# Patient Record
Sex: Male | Born: 1955 | Race: White | Hispanic: No | Smoking: Former smoker
Health system: Southern US, Community
[De-identification: ages and names within clinical notes are randomized; demographics above are authoritative.]

## PROBLEM LIST (undated history)

## (undated) DIAGNOSIS — K08109 Complete loss of teeth, unspecified cause, unspecified class: Secondary | ICD-10-CM

## (undated) DIAGNOSIS — I255 Ischemic cardiomyopathy: Secondary | ICD-10-CM

## (undated) DIAGNOSIS — K635 Polyp of colon: Secondary | ICD-10-CM

## (undated) DIAGNOSIS — K7689 Other specified diseases of liver: Secondary | ICD-10-CM

## (undated) DIAGNOSIS — I471 Supraventricular tachycardia, unspecified: Secondary | ICD-10-CM

## (undated) DIAGNOSIS — I2119 ST elevation (STEMI) myocardial infarction involving other coronary artery of inferior wall: Secondary | ICD-10-CM

## (undated) DIAGNOSIS — R001 Bradycardia, unspecified: Secondary | ICD-10-CM

## (undated) DIAGNOSIS — I10 Essential (primary) hypertension: Secondary | ICD-10-CM

## (undated) DIAGNOSIS — M549 Dorsalgia, unspecified: Secondary | ICD-10-CM

## (undated) DIAGNOSIS — N183 Chronic kidney disease, stage 3 unspecified: Secondary | ICD-10-CM

## (undated) DIAGNOSIS — E785 Hyperlipidemia, unspecified: Secondary | ICD-10-CM

## (undated) DIAGNOSIS — K810 Acute cholecystitis: Secondary | ICD-10-CM

## (undated) DIAGNOSIS — K219 Gastro-esophageal reflux disease without esophagitis: Secondary | ICD-10-CM

## (undated) DIAGNOSIS — Z7902 Long term (current) use of antithrombotics/antiplatelets: Secondary | ICD-10-CM

## (undated) DIAGNOSIS — J449 Chronic obstructive pulmonary disease, unspecified: Secondary | ICD-10-CM

## (undated) DIAGNOSIS — K420 Umbilical hernia with obstruction, without gangrene: Secondary | ICD-10-CM

## (undated) DIAGNOSIS — I700 Atherosclerosis of aorta: Secondary | ICD-10-CM

## (undated) DIAGNOSIS — I714 Abdominal aortic aneurysm, without rupture, unspecified: Secondary | ICD-10-CM

## (undated) DIAGNOSIS — N400 Enlarged prostate without lower urinary tract symptoms: Secondary | ICD-10-CM

## (undated) DIAGNOSIS — Z8679 Personal history of other diseases of the circulatory system: Secondary | ICD-10-CM

## (undated) DIAGNOSIS — I493 Ventricular premature depolarization: Secondary | ICD-10-CM

## (undated) DIAGNOSIS — Z7982 Long term (current) use of aspirin: Secondary | ICD-10-CM

## (undated) DIAGNOSIS — Z955 Presence of coronary angioplasty implant and graft: Secondary | ICD-10-CM

## (undated) DIAGNOSIS — I491 Atrial premature depolarization: Secondary | ICD-10-CM

## (undated) DIAGNOSIS — I5022 Chronic systolic (congestive) heart failure: Secondary | ICD-10-CM

## (undated) DIAGNOSIS — F17200 Nicotine dependence, unspecified, uncomplicated: Secondary | ICD-10-CM

## (undated) DIAGNOSIS — K5790 Diverticulosis of intestine, part unspecified, without perforation or abscess without bleeding: Secondary | ICD-10-CM

## (undated) DIAGNOSIS — I5020 Unspecified systolic (congestive) heart failure: Secondary | ICD-10-CM

## (undated) DIAGNOSIS — I480 Paroxysmal atrial fibrillation: Secondary | ICD-10-CM

## (undated) DIAGNOSIS — IMO0001 Reserved for inherently not codable concepts without codable children: Secondary | ICD-10-CM

## (undated) DIAGNOSIS — R002 Palpitations: Secondary | ICD-10-CM

## (undated) DIAGNOSIS — I2510 Atherosclerotic heart disease of native coronary artery without angina pectoris: Secondary | ICD-10-CM

## (undated) HISTORY — PX: INGUINAL HERNIA REPAIR: SUR1180

## (undated) HISTORY — DX: Atrial premature depolarization: I49.1

## (undated) HISTORY — DX: Ventricular premature depolarization: I49.3

## (undated) HISTORY — DX: Complete loss of teeth, unspecified cause, unspecified class: K08.109

## (undated) HISTORY — DX: Chronic systolic (congestive) heart failure: I50.22

## (undated) HISTORY — DX: Abdominal aortic aneurysm, without rupture, unspecified: I71.40

## (undated) HISTORY — DX: Presence of coronary angioplasty implant and graft: Z95.5

## (undated) HISTORY — PX: FRACTURE SURGERY: SHX138

## (undated) HISTORY — DX: Bradycardia, unspecified: R00.1

## (undated) HISTORY — DX: Supraventricular tachycardia, unspecified: I47.10

## (undated) HISTORY — DX: Atherosclerotic heart disease of native coronary artery without angina pectoris: I25.10

---

## 2010-11-24 ENCOUNTER — Ambulatory Visit: Payer: Self-pay | Admitting: General Surgery

## 2013-11-10 ENCOUNTER — Emergency Department: Payer: Self-pay | Admitting: Emergency Medicine

## 2017-12-12 ENCOUNTER — Emergency Department: Payer: Self-pay

## 2017-12-12 ENCOUNTER — Encounter: Payer: Self-pay | Admitting: Emergency Medicine

## 2017-12-12 ENCOUNTER — Other Ambulatory Visit: Payer: Self-pay

## 2017-12-12 ENCOUNTER — Emergency Department
Admission: EM | Admit: 2017-12-12 | Discharge: 2017-12-12 | Disposition: A | Discharge status: Home / Self Care | Payer: Self-pay | Attending: Emergency Medicine | Admitting: Emergency Medicine

## 2017-12-12 DIAGNOSIS — F17200 Nicotine dependence, unspecified, uncomplicated: Secondary | ICD-10-CM | POA: Insufficient documentation

## 2017-12-12 DIAGNOSIS — M47896 Other spondylosis, lumbar region: Secondary | ICD-10-CM

## 2017-12-12 DIAGNOSIS — I10 Essential (primary) hypertension: Secondary | ICD-10-CM | POA: Insufficient documentation

## 2017-12-12 DIAGNOSIS — M47816 Spondylosis without myelopathy or radiculopathy, lumbar region: Secondary | ICD-10-CM | POA: Insufficient documentation

## 2017-12-12 HISTORY — DX: Essential (primary) hypertension: I10

## 2017-12-12 HISTORY — DX: Dorsalgia, unspecified: M54.9

## 2017-12-12 MED ORDER — HYDROMORPHONE HCL 1 MG/ML IJ SOLN
1.0000 mg | Freq: Once | INTRAMUSCULAR | Status: AC
  Administered 2017-12-12: 1 mg via INTRAVENOUS
  Filled 2017-12-12: qty 1

## 2017-12-12 MED ORDER — MORPHINE SULFATE (PF) 4 MG/ML IV SOLN
4.0000 mg | Freq: Once | INTRAVENOUS | Status: AC
  Administered 2017-12-12: 4 mg via INTRAVENOUS
  Filled 2017-12-12: qty 1

## 2017-12-12 MED ORDER — KETOROLAC TROMETHAMINE 30 MG/ML IJ SOLN
30.0000 mg | Freq: Once | INTRAMUSCULAR | Status: AC
  Administered 2017-12-12: 30 mg via INTRAVENOUS
  Filled 2017-12-12: qty 1

## 2017-12-12 MED ORDER — ORPHENADRINE CITRATE 30 MG/ML IJ SOLN
60.0000 mg | Freq: Two times a day (BID) | INTRAMUSCULAR | Status: DC
  Administered 2017-12-12: 60 mg via INTRAVENOUS
  Filled 2017-12-12: qty 2

## 2017-12-12 MED ORDER — CYCLOBENZAPRINE HCL 10 MG PO TABS: 10.0000 mg | ORAL_TABLET | Freq: Three times a day (TID) | ORAL | 0 refills | Status: DC | PRN

## 2017-12-12 MED ORDER — MELOXICAM 15 MG PO TABS: 15.0000 mg | ORAL_TABLET | Freq: Every day | ORAL | 0 refills | Status: DC

## 2017-12-12 MED ORDER — OXYCODONE-ACETAMINOPHEN 7.5-325 MG PO TABS: 1.0000 | ORAL_TABLET | Freq: Four times a day (QID) | ORAL | 0 refills | Status: DC | PRN

## 2017-12-12 NOTE — ED Provider Notes (Signed)
[facility] Emergency Department Provider Note   ____________________________________________   First MD Initiated Contact with Patient 12/12/17 [id number]     (approximate)  I have reviewed the triage vital signs and the nursing notes.   HISTORY  Chief Complaint Back Pain    HPI Darryl Gilbert is a 62 y.o. male patient complain of right hip pain that radiates to his lower leg.  Patient the pain also radiates to his groin.  Patient the pain started 4 days ago.  Patient denies provocative incident for complaint.  Patient unable to bear weight this morning and arrived via EMS.  Past Medical History:  Diagnosis Date  . Back pain   . Hypertension     There are no active problems to display for this patient.   History reviewed. No pertinent surgical history.  Prior to Admission medications   Medication Sig Start Date End Date Taking? Authorizing Provider  losartan (COZAAR) 100 MG tablet Take 100 mg by mouth daily.   Yes [provider]  olmesartan (BENICAR) 20 MG tablet Take 20 mg by mouth daily.   Yes [provider]  cyclobenzaprine (FLEXERIL) 10 MG tablet Take 1 tablet (10 mg total) by mouth 3 (three) times daily as needed. 12/12/17   Sable Feil, PA-C  meloxicam (MOBIC) 15 MG tablet Take 1 tablet (15 mg total) by mouth daily. 12/12/17   Sable Feil, PA-C  oxyCODONE-acetaminophen (PERCOCET) 7.5-325 MG tablet Take 1 tablet by mouth every 6 (six) hours as needed. 12/12/17   Sable Feil, PA-C    Allergies Patient has no known allergies.  No family history on file.  Social History Social History   Tobacco Use  . Smoking status: Current Every Day Smoker  . Smokeless tobacco: Never Used  Substance Use Topics  . Alcohol use: Not on file  . Drug use: Not on file    Review of Systems Constitutional: No fever/chills Eyes: No visual changes. ENT: No sore throat. Cardiovascular: Denies chest pain. Respiratory: Denies  shortness of breath. Gastrointestinal: No abdominal pain.  No nausea, no vomiting.  No diarrhea.  No constipation. Genitourinary: Negative for dysuria. Musculoskeletal: Positive for back pain, left hip, and left leg pain Skin: Negative for rash. Neurological: Negative for headaches, focal weakness or numbness.   ____________________________________________   PHYSICAL EXAM:  VITAL SIGNS: ED Triage Vitals [12/12/17 0847]  Enc Vitals Group     BP (!) 145/67     Pulse Rate 71     Resp 20     Temp 98 F (36.7 C)     Temp Source Oral     SpO2 98 %     Weight      Height      Head Circumference      Peak Flow      Pain Score 8     Pain Loc      Pain Edu?      Excl. in [place]?    Constitutional: Alert and oriented.  Moderate stress. Gastrointestinal: Soft and nontender. No distention. No abdominal bruits. No CVA tenderness. Musculoskeletal: No obvious spinal deformity.  Moderate guarding palpation spinal processes 4 through S1.  Patient also has moderate guarding with palpation to greater trochanter area. Neurologic:  Normal speech and language. No gross focal neurologic deficits are appreciated. No gait instability. Skin:  Skin is warm, dry and intact. No rash noted.  Surgical scar consistent with operation and internal fixation left lower extremity. Psychiatric: Mood and  affect are normal. Speech and behavior are normal.  ____________________________________________   LABS (all labs ordered are listed, but only abnormal results are displayed)  Labs Reviewed - No data to display ____________________________________________  EKG   ____________________________________________  RADIOLOGY  ED MD interpretation:    Official radiology report(s): Dg Tibia/fibula Left  Result Date: 12/12/2017 CLINICAL DATA:  Presents via ems with back pain and acute pain that radiates into left hip, States pain started last Thursday and pain became worse this am Unable to bear any weight this  am EXAM: LEFT TIBIA AND FIBULA - 2 VIEW COMPARISON:  None. FINDINGS: There has been remote intramedullary nail fixation of the tibia. The tibial and fibular fracture sites are well-healed. No lucency about the hardware to indicate loosening/infection. No joint effusion at the knee. No soft tissue gas or cortical erosion to indicate infection. IMPRESSION: Postoperative changes.  No evidence for acute  abnormality. Electronically Signed   By: Nolon Nations M.D.   On: 12/12/2017 09:39   Ct Lumbar Spine Wo Contrast  Result Date: 12/12/2017 CLINICAL DATA:  Left lower quadrant pain starting on Thursday. Lumbar back pain. Unable to bear weight. EXAM: CT LUMBAR SPINE WITHOUT CONTRAST TECHNIQUE: Multidetector CT imaging of the lumbar spine was performed without intravenous contrast administration. Multiplanar CT image reconstructions were also generated. COMPARISON:  None. FINDINGS: Segmentation: 5 lumbar type vertebral bodies Alignment: Dextroscoliosis.  Mild L5-S1 retrolisthesis. Vertebrae: No acute fracture or focal pathologic process. 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 Paraspinal and other soft tissues: Atherosclerosis. 36 mm diameter infrarenal abdominal aortic aneurysm. Disc levels: T12- L1: Unremarkable. L1-L2: Disc narrowing with vacuum phenomenon and posterior ridging. No visible impingement L2-L3: Spondylosis and disc narrowing. Mild facet hypertrophy. No visible impingement L3-L4: Advanced disc narrowing with asymmetric left far-lateral endplate ridging. Asymmetric left facet hypertrophy. The foramina canal or patent. Question a left far-lateral herniation impinging on the left L3 nerve root, see 4:58. L4-L5: Advanced disc narrowing with endplate ridging. The posterior elements are hypertrophic. Subarticular recess narrowing that could affect either L5 nerve root. The foramina are patent L5-S1:Advanced degenerative disc narrowing asymmetric towards the right where there is  greater far-lateral spurring. Gas-filled structure in the lower right foramen compatible with synovial cyst-noncompressive. Patent canal IMPRESSION: 1. No acute finding. 2. Advanced degenerative disease with dextroscoliosis as described above. 3. Abdominal aortic aneurysm measuring 36 mm in diameter. Recommend followup by ultrasound in 2 years. This recommendation follows ACR consensus guidelines: White Paper of the ACR Incidental Findings Committee II on Vascular Findings. J Am Coll Radiol 2013; 10:789-794. Electronically Signed   By: Monte Fantasia M.D.   On: 12/12/2017 10:38   Dg Hip Unilat W Or Wo Pelvis 2-3 Views Left  Result Date: 12/12/2017 CLINICAL DATA:  Back pain radiating to LEFT hip EXAM: DG HIP (WITH OR WITHOUT PELVIS) 2-3V LEFT COMPARISON:  None FINDINGS: Hip and SI joint spaces preserved. Low normal osseous mineralization. No acute fracture, dislocation, or bone destruction. Degenerative disc disease changes at L4-L5 and L5-S1. IMPRESSION: No acute osseous abnormalities. Degenerative disc disease changes at visualized lower lumbar spine. Electronically Signed   By: Lavonia Dana M.D.   On: 12/12/2017 09:38    ____________________________________________   PROCEDURES  Procedure(s) performed:   Procedures  Critical Care performed: No  ____________________________________________   INITIAL IMPRESSION / ASSESSMENT AND PLAN / ED COURSE  As part of my medical decision making, I reviewed the following data within the [place]  Acute low back and hip pain secondary to degenerative changes.  Discussed x-ray and CT findings with patient and wife.  Patient given discharge care instruction advised follow-up PCP for continued care.  Patient given a work note and advised take medication as directed.  Patient advised medication may cause drowsiness.      ____________________________________________   FINAL CLINICAL IMPRESSION(S) / ED DIAGNOSES  Final  diagnoses:  Other osteoarthritis of spine, lumbar region     ED Discharge Orders         Ordered    oxyCODONE-acetaminophen (PERCOCET) 7.5-325 MG tablet  Every 6 hours PRN     12/12/17 1120    meloxicam (MOBIC) 15 MG tablet  Daily     12/12/17 1120    cyclobenzaprine (FLEXERIL) 10 MG tablet  3 times daily PRN     12/12/17 1120           Note:  This document was prepared using Dragon voice recognition software and may include unintentional dictation errors.    Sable Feil, PA-C 12/12/17 1128    Nance Pear, MD 12/12/17 1153

## 2017-12-12 NOTE — ED Triage Notes (Signed)
Presents via ems with back pain  States pain started last Thursday    States pain became worse this am  Unable to bear any wt this am

## 2017-12-12 NOTE — Discharge Instructions (Addendum)
Ambulate with crutches as needed.  Follow-up with family doctor for continued care.  Consider orthopedic consult.

## 2018-01-08 ENCOUNTER — Other Ambulatory Visit: Payer: Self-pay | Admitting: Physician Assistant

## 2019-03-06 ENCOUNTER — Other Ambulatory Visit: Payer: Self-pay

## 2019-03-06 DIAGNOSIS — Z20822 Contact with and (suspected) exposure to covid-19: Secondary | ICD-10-CM

## 2019-03-08 LAB — NOVEL CORONAVIRUS, NAA: SARS-CoV-2, NAA: NOT DETECTED

## 2020-02-25 ENCOUNTER — Ambulatory Visit: Payer: Self-pay | Attending: Internal Medicine

## 2020-02-25 DIAGNOSIS — Z23 Encounter for immunization: Secondary | ICD-10-CM

## 2020-02-25 NOTE — Progress Notes (Signed)
   Covid-19 Vaccination Clinic  Name:  Darryl Gilbert    MRN: [id number] DOB: 1955/10/09  02/25/2020  Darryl Gilbert was observed post Covid-19 immunization for 15 minutes without incident. He was provided with Vaccine Information Sheet and instruction to access the V-Safe system.   Darryl Gilbert was instructed to call 911 with any severe reactions post vaccine: Marland Kitchen Difficulty breathing  . Swelling of face and throat  . A fast heartbeat  . A bad rash all over body  . Dizziness and weakness

## 2020-12-20 ENCOUNTER — Encounter
Admission: EM | Disposition: A | Discharge status: Home / Self Care | Payer: Self-pay | Source: Home / Self Care | Attending: Internal Medicine

## 2020-12-20 ENCOUNTER — Inpatient Hospital Stay
Admission: EM | Admit: 2020-12-20 | Discharge: 2020-12-26 | DRG: 246 | Disposition: A | Discharge status: Home / Self Care | Payer: Self-pay | Attending: Internal Medicine | Admitting: Internal Medicine

## 2020-12-20 DIAGNOSIS — N289 Disorder of kidney and ureter, unspecified: Secondary | ICD-10-CM | POA: Diagnosis present

## 2020-12-20 DIAGNOSIS — F17210 Nicotine dependence, cigarettes, uncomplicated: Secondary | ICD-10-CM | POA: Diagnosis present

## 2020-12-20 DIAGNOSIS — I5021 Acute systolic (congestive) heart failure: Secondary | ICD-10-CM

## 2020-12-20 DIAGNOSIS — I4891 Unspecified atrial fibrillation: Secondary | ICD-10-CM

## 2020-12-20 DIAGNOSIS — Z0189 Encounter for other specified special examinations: Secondary | ICD-10-CM

## 2020-12-20 DIAGNOSIS — G8929 Other chronic pain: Secondary | ICD-10-CM | POA: Diagnosis present

## 2020-12-20 DIAGNOSIS — I2119 ST elevation (STEMI) myocardial infarction involving other coronary artery of inferior wall: Secondary | ICD-10-CM

## 2020-12-20 DIAGNOSIS — Z8679 Personal history of other diseases of the circulatory system: Secondary | ICD-10-CM

## 2020-12-20 DIAGNOSIS — I472 Ventricular tachycardia: Secondary | ICD-10-CM | POA: Diagnosis not present

## 2020-12-20 DIAGNOSIS — I4901 Ventricular fibrillation: Secondary | ICD-10-CM | POA: Diagnosis not present

## 2020-12-20 DIAGNOSIS — I2510 Atherosclerotic heart disease of native coronary artery without angina pectoris: Secondary | ICD-10-CM

## 2020-12-20 DIAGNOSIS — E876 Hypokalemia: Secondary | ICD-10-CM | POA: Diagnosis not present

## 2020-12-20 DIAGNOSIS — I2121 ST elevation (STEMI) myocardial infarction involving left circumflex coronary artery: Secondary | ICD-10-CM

## 2020-12-20 DIAGNOSIS — I110 Hypertensive heart disease with heart failure: Secondary | ICD-10-CM | POA: Diagnosis present

## 2020-12-20 DIAGNOSIS — Z20822 Contact with and (suspected) exposure to covid-19: Secondary | ICD-10-CM | POA: Diagnosis present

## 2020-12-20 DIAGNOSIS — I480 Paroxysmal atrial fibrillation: Secondary | ICD-10-CM | POA: Diagnosis present

## 2020-12-20 DIAGNOSIS — I213 ST elevation (STEMI) myocardial infarction of unspecified site: Secondary | ICD-10-CM | POA: Diagnosis present

## 2020-12-20 DIAGNOSIS — R0603 Acute respiratory distress: Secondary | ICD-10-CM

## 2020-12-20 DIAGNOSIS — F17200 Nicotine dependence, unspecified, uncomplicated: Secondary | ICD-10-CM

## 2020-12-20 DIAGNOSIS — I9581 Postprocedural hypotension: Secondary | ICD-10-CM | POA: Diagnosis not present

## 2020-12-20 DIAGNOSIS — I255 Ischemic cardiomyopathy: Secondary | ICD-10-CM | POA: Diagnosis present

## 2020-12-20 DIAGNOSIS — J9600 Acute respiratory failure, unspecified whether with hypoxia or hypercapnia: Secondary | ICD-10-CM

## 2020-12-20 DIAGNOSIS — I10 Essential (primary) hypertension: Secondary | ICD-10-CM

## 2020-12-20 DIAGNOSIS — Z720 Tobacco use: Secondary | ICD-10-CM

## 2020-12-20 DIAGNOSIS — R0789 Other chest pain: Secondary | ICD-10-CM

## 2020-12-20 DIAGNOSIS — Z791 Long term (current) use of non-steroidal anti-inflammatories (NSAID): Secondary | ICD-10-CM

## 2020-12-20 DIAGNOSIS — Z452 Encounter for adjustment and management of vascular access device: Secondary | ICD-10-CM

## 2020-12-20 DIAGNOSIS — E785 Hyperlipidemia, unspecified: Secondary | ICD-10-CM | POA: Diagnosis present

## 2020-12-20 DIAGNOSIS — I25118 Atherosclerotic heart disease of native coronary artery with other forms of angina pectoris: Secondary | ICD-10-CM | POA: Diagnosis present

## 2020-12-20 DIAGNOSIS — I97190 Other postprocedural cardiac functional disturbances following cardiac surgery: Secondary | ICD-10-CM | POA: Diagnosis not present

## 2020-12-20 DIAGNOSIS — J9601 Acute respiratory failure with hypoxia: Secondary | ICD-10-CM | POA: Diagnosis present

## 2020-12-20 DIAGNOSIS — Y838 Other surgical procedures as the cause of abnormal reaction of the patient, or of later complication, without mention of misadventure at the time of the procedure: Secondary | ICD-10-CM | POA: Diagnosis not present

## 2020-12-20 DIAGNOSIS — M549 Dorsalgia, unspecified: Secondary | ICD-10-CM | POA: Diagnosis present

## 2020-12-20 DIAGNOSIS — Z79899 Other long term (current) drug therapy: Secondary | ICD-10-CM

## 2020-12-20 DIAGNOSIS — R739 Hyperglycemia, unspecified: Secondary | ICD-10-CM | POA: Diagnosis not present

## 2020-12-20 DIAGNOSIS — I493 Ventricular premature depolarization: Secondary | ICD-10-CM | POA: Diagnosis not present

## 2020-12-20 DIAGNOSIS — D72829 Elevated white blood cell count, unspecified: Secondary | ICD-10-CM | POA: Diagnosis present

## 2020-12-20 DIAGNOSIS — I471 Supraventricular tachycardia: Secondary | ICD-10-CM | POA: Diagnosis present

## 2020-12-20 DIAGNOSIS — I2129 ST elevation (STEMI) myocardial infarction involving other sites: Principal | ICD-10-CM | POA: Diagnosis present

## 2020-12-20 HISTORY — PX: CORONARY/GRAFT ACUTE MI REVASCULARIZATION: CATH118305

## 2020-12-20 HISTORY — DX: ST elevation (STEMI) myocardial infarction involving other coronary artery of inferior wall: I21.19

## 2020-12-20 HISTORY — DX: Ischemic cardiomyopathy: I25.5

## 2020-12-20 HISTORY — PX: LEFT HEART CATH AND CORONARY ANGIOGRAPHY: CATH118249

## 2020-12-20 HISTORY — DX: Hyperlipidemia, unspecified: E78.5

## 2020-12-20 HISTORY — DX: Atherosclerotic heart disease of native coronary artery without angina pectoris: I25.10

## 2020-12-20 HISTORY — DX: Personal history of other diseases of the circulatory system: Z86.79

## 2020-12-20 LAB — COMPREHENSIVE METABOLIC PANEL
ALT: 22 U/L (ref 0–44)
AST: 30 U/L (ref 15–41)
Albumin: 4.1 g/dL (ref 3.5–5.0)
Alkaline Phosphatase: 49 U/L (ref 38–126)
Anion gap: 10 (ref 5–15)
BUN: 22 mg/dL (ref 8–23)
CO2: 23 mmol/L (ref 22–32)
Calcium: 9.6 mg/dL (ref 8.9–10.3)
Chloride: 102 mmol/L (ref 98–111)
Creatinine, Ser: 1.63 mg/dL — ABNORMAL HIGH (ref 0.61–1.24)
GFR, Estimated: 47 mL/min — ABNORMAL LOW (ref >60)
Glucose, Bld: 139 mg/dL — ABNORMAL HIGH (ref 70–99)
Potassium: 3.9 mmol/L (ref 3.5–5.1)
Sodium: 135 mmol/L (ref 135–145)
Total Bilirubin: 1 mg/dL (ref 0.3–1.2)
Total Protein: 7.5 g/dL (ref 6.5–8.1)

## 2020-12-20 LAB — PROTIME-INR
INR: 1 (ref 0.8–1.2)
Prothrombin Time: 13.1 seconds (ref 11.4–15.2)

## 2020-12-20 LAB — CBC WITH DIFFERENTIAL/PLATELET
Abs Immature Granulocytes: 0.05 10*3/uL (ref 0.00–0.07)
Basophils Absolute: 0.1 10*3/uL (ref 0.0–0.1)
Basophils Relative: 1 %
Eosinophils Absolute: 0.4 10*3/uL (ref 0.0–0.5)
Eosinophils Relative: 4 %
HCT: 42.9 % (ref 39.0–52.0)
Hemoglobin: 15.2 g/dL (ref 13.0–17.0)
Immature Granulocytes: 1 %
Lymphocytes Relative: 28 %
Lymphs Abs: 2.8 10*3/uL (ref 0.7–4.0)
MCH: 32.4 pg (ref 26.0–34.0)
MCHC: 35.4 g/dL (ref 30.0–36.0)
MCV: 91.5 fL (ref 80.0–100.0)
Monocytes Absolute: 0.5 10*3/uL (ref 0.1–1.0)
Monocytes Relative: 5 %
Neutro Abs: 6.1 10*3/uL (ref 1.7–7.7)
Neutrophils Relative %: 61 %
Platelets: 250 10*3/uL (ref 150–400)
RBC: 4.69 MIL/uL (ref 4.22–5.81)
RDW: 13.3 % (ref 11.5–15.5)
WBC: 9.9 10*3/uL (ref 4.0–10.5)
nRBC: 0 % (ref 0.0–0.2)

## 2020-12-20 LAB — APTT: aPTT: 31 seconds (ref 24–36)

## 2020-12-20 LAB — MRSA NEXT GEN BY PCR, NASAL: MRSA by PCR Next Gen: NOT DETECTED

## 2020-12-20 LAB — MAGNESIUM: Magnesium: 1.7 mg/dL (ref 1.7–2.4)

## 2020-12-20 LAB — GLUCOSE, CAPILLARY: Glucose-Capillary: 115 mg/dL — ABNORMAL HIGH (ref 70–99)

## 2020-12-20 LAB — TROPONIN I (HIGH SENSITIVITY)
Troponin I (High Sensitivity): 19 ng/L — ABNORMAL HIGH (ref <18)
Troponin I (High Sensitivity): 4521 ng/L (ref <18)
Troponin I (High Sensitivity): >24000 ng/L (ref <18)
Troponin I (High Sensitivity): >24000 ng/L (ref <18)

## 2020-12-20 LAB — RESP PANEL BY RT-PCR (FLU A&B, COVID) ARPGX2
Influenza A by PCR: NEGATIVE
Influenza B by PCR: NEGATIVE
SARS Coronavirus 2 by RT PCR: NEGATIVE

## 2020-12-20 LAB — POCT ACTIVATED CLOTTING TIME
Activated Clotting Time: 277 seconds
Activated Clotting Time: 277 seconds

## 2020-12-20 SURGERY — CORONARY/GRAFT ACUTE MI REVASCULARIZATION
Anesthesia: Moderate Sedation

## 2020-12-20 MED ORDER — TICAGRELOR 90 MG PO TABS
ORAL_TABLET | ORAL | Status: DC | PRN
  Administered 2020-12-20: 180 mg via ORAL

## 2020-12-20 MED ORDER — ONDANSETRON HCL 4 MG/2ML IJ SOLN: 4.0000 mg | Freq: Four times a day (QID) | INTRAMUSCULAR | Status: DC | PRN

## 2020-12-20 MED ORDER — AMIODARONE HCL IN DEXTROSE 360-4.14 MG/200ML-% IV SOLN
30.0000 mg/h | INTRAVENOUS | Status: DC
  Administered 2020-12-21 (×3): 30 mg/h via INTRAVENOUS
  Filled 2020-12-20 (×3): qty 200

## 2020-12-20 MED ORDER — SODIUM CHLORIDE 0.9% FLUSH
3.0000 mL | INTRAVENOUS | Status: DC | PRN
  Administered 2020-12-25: 3 mL via INTRAVENOUS

## 2020-12-20 MED ORDER — VERAPAMIL HCL 2.5 MG/ML IV SOLN
INTRAVENOUS | Status: DC | PRN
  Administered 2020-12-20: 2.5 mg via INTRAVENOUS

## 2020-12-20 MED ORDER — AMIODARONE LOAD VIA INFUSION
150.0000 mg | Freq: Once | INTRAVENOUS | Status: DC
  Filled 2020-12-20: qty 83.34

## 2020-12-20 MED ORDER — SODIUM CHLORIDE 0.9 % WEIGHT BASED INFUSION
1.0000 mL/kg/h | INTRAVENOUS | Status: AC
  Administered 2020-12-20: 1 mL/kg/h via INTRAVENOUS

## 2020-12-20 MED ORDER — MIDAZOLAM HCL 2 MG/2ML IJ SOLN
INTRAMUSCULAR | Status: AC
  Filled 2020-12-20: qty 2

## 2020-12-20 MED ORDER — FENTANYL CITRATE (PF) 100 MCG/2ML IJ SOLN
INTRAMUSCULAR | Status: DC | PRN
  Administered 2020-12-20: 50 ug via INTRAVENOUS

## 2020-12-20 MED ORDER — FENTANYL CITRATE PF 50 MCG/ML IJ SOSY
PREFILLED_SYRINGE | INTRAMUSCULAR | Status: AC
  Administered 2020-12-20: 50 ug via INTRAVENOUS
  Filled 2020-12-20: qty 1

## 2020-12-20 MED ORDER — TICAGRELOR 90 MG PO TABS
ORAL_TABLET | ORAL | Status: AC
  Filled 2020-12-20: qty 2

## 2020-12-20 MED ORDER — NOREPINEPHRINE BITARTRATE 1 MG/ML IV SOLN
INTRAVENOUS | Status: AC
  Filled 2020-12-20: qty 4

## 2020-12-20 MED ORDER — VERAPAMIL HCL 2.5 MG/ML IV SOLN
INTRAVENOUS | Status: AC
  Filled 2020-12-20: qty 2

## 2020-12-20 MED ORDER — ASPIRIN 81 MG PO CHEW
81.0000 mg | CHEWABLE_TABLET | Freq: Every day | ORAL | Status: DC
  Administered 2020-12-20 – 2020-12-26 (×7): 81 mg via ORAL
  Filled 2020-12-20 (×7): qty 1

## 2020-12-20 MED ORDER — ATORVASTATIN CALCIUM 20 MG PO TABS
80.0000 mg | ORAL_TABLET | Freq: Every day | ORAL | Status: DC
  Administered 2020-12-20: 80 mg via ORAL

## 2020-12-20 MED ORDER — CHLORHEXIDINE GLUCONATE CLOTH 2 % EX PADS
6.0000 | MEDICATED_PAD | Freq: Every day | CUTANEOUS | Status: DC
  Administered 2020-12-22 – 2020-12-26 (×3): 6 via TOPICAL

## 2020-12-20 MED ORDER — ACETAMINOPHEN 325 MG PO TABS
650.0000 mg | ORAL_TABLET | ORAL | Status: DC | PRN
  Administered 2020-12-20 – 2020-12-23 (×2): 650 mg via ORAL
  Filled 2020-12-20: qty 2

## 2020-12-20 MED ORDER — METOPROLOL TARTRATE 5 MG/5ML IV SOLN
INTRAVENOUS | Status: AC
  Administered 2020-12-20: 5 mg via INTRAVENOUS
  Filled 2020-12-20: qty 5

## 2020-12-20 MED ORDER — CYCLOBENZAPRINE HCL 10 MG PO TABS
10.0000 mg | ORAL_TABLET | Freq: Three times a day (TID) | ORAL | Status: DC | PRN
  Administered 2020-12-20 – 2020-12-21 (×3): 10 mg via ORAL
  Filled 2020-12-20 (×7): qty 1

## 2020-12-20 MED ORDER — HEPARIN SODIUM (PORCINE) 1000 UNIT/ML IJ SOLN
INTRAMUSCULAR | Status: DC | PRN
  Administered 2020-12-20: 5000 [IU] via INTRAVENOUS
  Administered 2020-12-20: 2000 [IU] via INTRAVENOUS

## 2020-12-20 MED ORDER — AMIODARONE LOAD VIA INFUSION
INTRAVENOUS | Status: DC | PRN
  Administered 2020-12-20: 150 mg via INTRAVENOUS

## 2020-12-20 MED ORDER — LIDOCAINE HCL (PF) 1 % IJ SOLN
INTRAMUSCULAR | Status: DC | PRN
  Administered 2020-12-20: 3 mL

## 2020-12-20 MED ORDER — HEPARIN (PORCINE) IN NACL 1000-0.9 UT/500ML-% IV SOLN
INTRAVENOUS | Status: AC
  Filled 2020-12-20: qty 1000

## 2020-12-20 MED ORDER — CARVEDILOL 3.125 MG PO TABS
3.1250 mg | ORAL_TABLET | Freq: Two times a day (BID) | ORAL | Status: DC
  Administered 2020-12-20 – 2020-12-21 (×3): 3.125 mg via ORAL
  Filled 2020-12-20 (×3): qty 1

## 2020-12-20 MED ORDER — AMIODARONE HCL IN DEXTROSE 360-4.14 MG/200ML-% IV SOLN
60.0000 mg/h | INTRAVENOUS | Status: DC
  Administered 2020-12-20 (×2): 60 mg/h via INTRAVENOUS
  Filled 2020-12-20: qty 200

## 2020-12-20 MED ORDER — SODIUM CHLORIDE 0.9 % IV BOLUS
1000.0000 mL | Freq: Once | INTRAVENOUS | Status: AC
  Administered 2020-12-20: 1000 mL via INTRAVENOUS

## 2020-12-20 MED ORDER — LIDOCAINE HCL 1 % IJ SOLN
INTRAMUSCULAR | Status: AC
  Filled 2020-12-20: qty 20

## 2020-12-20 MED ORDER — TICAGRELOR 90 MG PO TABS
90.0000 mg | ORAL_TABLET | Freq: Two times a day (BID) | ORAL | Status: DC
  Administered 2020-12-20 – 2020-12-22 (×5): 90 mg via ORAL
  Filled 2020-12-20 (×5): qty 1

## 2020-12-20 MED ORDER — SODIUM CHLORIDE 0.9 % IV SOLN
250.0000 mL | INTRAVENOUS | Status: DC | PRN
  Administered 2020-12-21: 1000 mL via INTRAVENOUS

## 2020-12-20 MED ORDER — FENTANYL CITRATE PF 50 MCG/ML IJ SOSY
PREFILLED_SYRINGE | INTRAMUSCULAR | Status: AC
  Filled 2020-12-20: qty 1

## 2020-12-20 MED ORDER — ENOXAPARIN SODIUM 40 MG/0.4ML IJ SOSY
40.0000 mg | PREFILLED_SYRINGE | INTRAMUSCULAR | Status: DC
  Administered 2020-12-21 – 2020-12-26 (×6): 40 mg via SUBCUTANEOUS
  Filled 2020-12-20 (×6): qty 0.4

## 2020-12-20 MED ORDER — SODIUM CHLORIDE 0.9% FLUSH
3.0000 mL | Freq: Two times a day (BID) | INTRAVENOUS | Status: DC
  Administered 2020-12-21 – 2020-12-26 (×9): 3 mL via INTRAVENOUS

## 2020-12-20 MED ORDER — HEPARIN (PORCINE) IN NACL 2000-0.9 UNIT/L-% IV SOLN
INTRAVENOUS | Status: DC | PRN
  Administered 2020-12-20: 1000 mL

## 2020-12-20 MED ORDER — HEPARIN SODIUM (PORCINE) 5000 UNIT/ML IJ SOLN
4000.0000 [IU] | Freq: Once | INTRAMUSCULAR | Status: AC
  Administered 2020-12-20: 4000 [IU] via INTRAVENOUS

## 2020-12-20 MED ORDER — AMIODARONE HCL IN DEXTROSE 360-4.14 MG/200ML-% IV SOLN
INTRAVENOUS | Status: AC
  Filled 2020-12-20: qty 200

## 2020-12-20 MED ORDER — AMIODARONE HCL 150 MG/3ML IV SOLN
INTRAVENOUS | Status: DC | PRN
Start: 2020-12-20 — End: 2020-12-20

## 2020-12-20 MED ORDER — IOHEXOL 350 MG/ML SOLN
INTRAVENOUS | Status: DC | PRN
  Administered 2020-12-20: 155 mL

## 2020-12-20 MED ORDER — MIDAZOLAM HCL 2 MG/2ML IJ SOLN
INTRAMUSCULAR | Status: DC | PRN
  Administered 2020-12-20: 2 mg via INTRAVENOUS

## 2020-12-20 MED ORDER — HEPARIN SODIUM (PORCINE) 1000 UNIT/ML IJ SOLN
INTRAMUSCULAR | Status: AC
  Filled 2020-12-20: qty 1

## 2020-12-20 MED ORDER — SODIUM CHLORIDE 0.9 % IV SOLN
INTRAVENOUS | Status: AC | PRN
  Administered 2020-12-20: 250 mL/h via INTRAVENOUS

## 2020-12-20 MED ORDER — HEPARIN (PORCINE) 25000 UT/250ML-% IV SOLN: 1250.0000 [IU]/h | INTRAVENOUS | Status: DC

## 2020-12-20 MED ORDER — SODIUM CHLORIDE 0.9 % IV SOLN
INTRAVENOUS | Status: DC | PRN
  Administered 2020-12-20: 5 ug/min via INTRAVENOUS

## 2020-12-20 SURGICAL SUPPLY — 19 items
BALLN TREK RX 2.5X12 (BALLOONS) ×2
BALLOON TREK RX 2.5X12 (BALLOONS) ×1 IMPLANT
CATH INFINITI 5FR ANG PIGTAIL (CATHETERS) ×2 IMPLANT
CATH LAUNCHER 6FR JR4 (CATHETERS) ×2 IMPLANT
CATH VISTA GUIDE 6FR AL1 (CATHETERS) ×2 IMPLANT
CATH VISTA GUIDE 6FR XBLAD3.5 (CATHETERS) ×2 IMPLANT
DEVICE RAD TR BAND REGULAR (VASCULAR PRODUCTS) ×2 IMPLANT
DRAPE BRACHIAL (DRAPES) ×2 IMPLANT
GLIDESHEATH SLEND SS 6F .021 (SHEATH) ×2 IMPLANT
GUIDEWIRE INQWIRE 1.5J.035X260 (WIRE) ×1 IMPLANT
INQWIRE 1.5J .035X260CM (WIRE) ×2
KIT ENCORE 26 ADVANTAGE (KITS) ×2 IMPLANT
PACK CARDIAC CATH (CUSTOM PROCEDURE TRAY) ×2 IMPLANT
PROTECTION STATION PRESSURIZED (MISCELLANEOUS) ×2
SET ATX SIMPLICITY (MISCELLANEOUS) ×2 IMPLANT
STATION PROTECTION PRESSURIZED (MISCELLANEOUS) ×1 IMPLANT
STENT ONYX FRONTIER 3.0X22 (Permanent Stent) ×2 IMPLANT
TUBING CIL FLEX 10 FLL-RA (TUBING) ×2 IMPLANT
WIRE RUNTHROUGH .014X180CM (WIRE) ×2 IMPLANT

## 2020-12-20 NOTE — H&P (Signed)
[facility] History and Physical  Darryl Gilbert [id number] DOB: May 14, 1955 DOA: 12/20/2020  Referring physician: Dr. [name] PCP: Patient, No Pcp Per (Inactive)   Chief Complaint: chest pain  HPI: Darryl Gilbert is a 65 y.o. male with a reported history only of hypertension and chronic back pain, who presented via EMS with chest pain.  Per ED provider documentation he presented to the ED via field STEMI alert.  On arrival he reported 10 out of 10 chest pain, had labile blood pressures and heart rate.  An EKG that was sent from the field as well as 1 on arrival showed an inferior and lateral STEMI.  Cardiology was consulted and took him immediately to the Cath Lab.  On my signout from Dr. [name] patient required significant sedation during the procedure as he was having intense pain.  He subsequently had a stent placed in his circumflex artery which was occluded with improvement in his pain.  Per Dr. Cicero Duck once that happened he experienced significant hypotension secondary to the large amounts of sedation he required, and was briefly on Levophed for pressure support but is currently weaning off as he is now becoming hypertensive again.  On my interview with the patient he reports that he was working on his shed earlier today when he suddenly had intense central chest pain that radiated to both arms.  He denied feeling nauseous but did endorse having cold sweats.  He denies any family history of heart disease.  He currently smokes half pack a day, which is a decrease from previously smoking 3 packs a day.   Review of Systems:  Pertinent positives and negative per HPI, all others reviewed and negative  Past Medical History:  Diagnosis Date   Back pain    Hypertension    History reviewed. No pertinent surgical history. Social History:  reports that he has been smoking. He has never used smokeless tobacco. No history on file for alcohol use and drug use.  No Known Allergies  History  reviewed. No pertinent family history.   Prior to Admission medications   Medication Sig Start Date End Date Taking? Authorizing Provider  cyclobenzaprine (FLEXERIL) 10 MG tablet Take 1 tablet (10 mg total) by mouth 3 (three) times daily as needed. 12/12/17   Sable Feil, PA-C  losartan (COZAAR) 100 MG tablet Take 100 mg by mouth daily.    [provider]  meloxicam (MOBIC) 15 MG tablet Take 1 tablet (15 mg total) by mouth daily. 12/12/17   Sable Feil, PA-C  olmesartan (BENICAR) 20 MG tablet Take 20 mg by mouth daily.    [provider]  oxyCODONE-acetaminophen (PERCOCET) 7.5-325 MG tablet Take 1 tablet by mouth every 6 (six) hours as needed. 12/12/17   Sable Feil, PA-C   Physical Exam: Vitals:   12/20/20 1528 12/20/20 1641 12/20/20 1645 12/20/20 1700  BP:  (!) 83/68 115/82 104/76  Pulse:  77  61  Resp:  '18 19 15  '$ SpO2:  91%  100%  Weight:  93.6 kg    Height: '6\' 1"'$  (1.854 m)       Wt Readings from Last 3 Encounters:  12/20/20 93.6 kg     General:  Appears calm and mildly uncomfortable Eyes: PERRL, normal lids, irises & conjunctiva ENT: grossly normal hearing, lips & tongue Neck: no masses Cardiovascular: RRR, no m/r/g. No LE edema.Warm, dry, and well perfused extremities. Telemetry: SR, rarely with short runs of Vtach or inferolateral ST elevations that quickly  resolve Respiratory: CTA bilaterally, no w/r/r. Normal respiratory effort. Abdomen: soft, ntnd Skin: no rash or induration seen on limited exam Musculoskeletal: grossly normal tone BUE/BLE Psychiatric: grossly normal mood and affect, speech fluent and appropriate Neurologic: grossly non-focal.          Labs on Admission:  Basic Metabolic Panel: Recent Labs  Lab 12/20/20 1448  NA 135  K 3.9  CL 102  CO2 23  GLUCOSE 139*  BUN 22  CREATININE 1.63*  CALCIUM 9.6  MG 1.7   Liver Function Tests: Recent Labs  Lab 12/20/20 1448  AST 30  ALT 22  ALKPHOS 49  BILITOT 1.0  PROT  7.5  ALBUMIN 4.1   No results for input(s): LIPASE, AMYLASE in the last 168 hours. No results for input(s): AMMONIA in the last 168 hours. CBC: Recent Labs  Lab 12/20/20 1448  WBC 9.9  NEUTROABS 6.1  HGB 15.2  HCT 42.9  MCV 91.5  PLT 250   Cardiac Enzymes: No results for input(s): CKTOTAL, CKMB, CKMBINDEX, TROPONINI in the last 168 hours.  BNP (last 3 results) No results for input(s): BNP in the last 8760 hours.  ProBNP (last 3 results) No results for input(s): PROBNP in the last 8760 hours.  CBG: Recent Labs  Lab 12/20/20 1642  GLUCAP 115*    Radiological Exams on Admission: CARDIAC CATHETERIZATION  Result Date: 12/20/2020   Ost RCA to Prox RCA lesion is 70% stenosed.   Prox Cx to Mid Cx lesion is 100% stenosed.   1st Mrg lesion is 60% stenosed.   A drug-eluting stent was successfully placed using a STENT ONYX FRONTIER 3.0X22.   Post intervention, there is a 0% residual stenosis.   There is moderate left ventricular systolic dysfunction.   LV end diastolic pressure is moderately elevated.   The left ventricular ejection fraction is 35-45% by visual estimate. 1.  Codominant coronary arteries with thrombotic occlusion of the proximal left circumflex which is the culprit for inferior STEMI.  There is also 70% stenosis in ostial right coronary artery which has high anterior takeoff.  Catheter induced spasm in the ostial right coronary artery cannot be completely excluded. 2.  Moderately reduced LV systolic function with segmental wall motion abnormalities in the left circumflex distribution.  Moderately elevated left ventricular end-diastolic pressure at 22 mmHg. 3.  Successful angioplasty and drug-eluting stent placement to the left circumflex. Recommendations: Dual antiplatelet therapy for at least 12 months. Aggressive treatment of risk factors and smoking cessation. The patient had atrial fibrillation on presentation but converted sinus rhythm at the end of the case with amiodarone  drip.  Continue amiodarone drip for now and monitor rhythm closely.    EKG: Independently reviewed.  Sinus tachycardia with acute ST elevations in leads II, III, aVF, V5, and V6, as well as reciprocal depressions in leads I, aVL, V1, and V2.  Assessment/Plan Active Problems:   STEMI (ST elevation myocardial infarction) (HCC)   Acute ST elevation myocardial infarction (STEMI) of inferior wall (HCC)   Darryl Gilbert is a 65 y.o. male with a reported history only of hypertension and chronic back pain, who presented via EMS with chest pain and was found to have an inferior lateral STEMI status post DES placement to the proximal left circumflex artery.  #STEMI #Transient Afib #HTN Patient with multiple risk factors including hypertension and smoking, status post DES placement in the Cath Lab with near resolution of his symptoms.  EF during case 35 to 40%.  Also briefly  had RVR that resolved with amiodarone at the end of the case. - DC heparin per cardiology recs - Continue aspirin and ticagrelor, minimum 12 months - Start carvedilol per cardiology recs - Resume ACE or ARB once blood pressure can tolerate - Maintain on amiodarone drip for time being - Atorvastatin 80 mg daily - Echocardiogram pending - Follow-up cardiology recommendations  #Tobacco use disorder Patient declined nicotine replacement therapy.   Code Status: Full Code DVT Prophylaxis: Lovenox Family Communication: None per patient request Disposition Plan: Inpatient, stepdown unit  Time spent: 50 min  Clarnce Flock MD/MP[facility]  Note:  This document was prepared using Systems analyst and may include unintentional dictation errors.

## 2020-12-20 NOTE — ED Provider Notes (Signed)
[facility] Emergency Department Provider Note ____________________________________________   None    (approximate)  I have reviewed the triage vital signs and the nursing notes.  HISTORY  Chief Complaint Chest Pain   HPI Darryl Gilbert is a 65 y.o. malewho presents to the ED for evaluation of chest pain.  Chart review indicates history of hypertension and smoking.  Patient presents to the ED via field STEMI alert.  He reports developing chest pain earlier today after working in his shed out back.  He has never felt this pain before.  Reports 10/10 global chest pressure, radiating bilaterally to his arms.  Reports associated nausea and diaphoresis without emesis or syncope.  He has never seen a cardiologist, no heart catheterizations, heart attacks or stents.  EMS reports finding him with low blood pressure, systolics in the 0000000, responding well to IV fluids, receiving 500 cc prehospital.  324 aspirin was provided, but no nitroglycerin.  100 mcg of fentanyl were provided prehospital.  Heart rate was in the 60s, but he significant tachycardic just prior to arrival to the hospital with rates in the 130s-140s.  Past Medical History:  Diagnosis Date   Back pain    Hypertension     There are no problems to display for this patient.   No past surgical history on file.  Prior to Admission medications   Medication Sig Start Date End Date Taking? Authorizing Provider  cyclobenzaprine (FLEXERIL) 10 MG tablet Take 1 tablet (10 mg total) by mouth 3 (three) times daily as needed. 12/12/17   Sable Feil, PA-C  losartan (COZAAR) 100 MG tablet Take 100 mg by mouth daily.    [provider]  meloxicam (MOBIC) 15 MG tablet Take 1 tablet (15 mg total) by mouth daily. 12/12/17   Sable Feil, PA-C  olmesartan (BENICAR) 20 MG tablet Take 20 mg by mouth daily.    [provider]  oxyCODONE-acetaminophen (PERCOCET) 7.5-325 MG tablet Take 1 tablet by  mouth every 6 (six) hours as needed. 12/12/17   Sable Feil, PA-C    Allergies Patient has no known allergies.  No family history on file.  Social History Social History   Tobacco Use   Smoking status: Every Day   Smokeless tobacco: Never    Review of Systems  Constitutional: No fever/chills Eyes: No visual changes. ENT: No sore throat. Cardiovascular: Positive for chest pain. Respiratory: Denies shortness of breath. Gastrointestinal: No abdominal pain.   No diarrhea.  No constipation. Genitourinary: Negative for dysuria. Musculoskeletal: Negative for back pain. Skin: Negative for rash. Neurological: Negative for headaches, focal weakness or numbness.  ____________________________________________   PHYSICAL EXAM:  VITAL SIGNS: Vitals:   12/20/20 1455 12/20/20 1456  BP: (!) 146/116   Pulse: 76 84  Resp: (!) 23 17  SpO2: 100% 100%     Constitutional: Alert and oriented. Well appearing and in no acute distress. Eyes: Conjunctivae are normal. PERRL. EOMI. Head: Atraumatic. Nose: No congestion/rhinnorhea. Mouth/Throat: Mucous membranes are moist.  Oropharynx non-erythematous. Neck: No stridor. No cervical spine tenderness to palpation. Cardiovascular: Normal rate, regular rhythm. Grossly normal heart sounds.  Good peripheral circulation. Respiratory: Normal respiratory effort.  No retractions. Lungs CTAB. Gastrointestinal: Soft , nondistended, nontender to palpation. No CVA tenderness. Musculoskeletal: No lower extremity tenderness nor edema.  No joint effusions. No signs of acute trauma. Neurologic:  Normal speech and language. No gross focal neurologic deficits are appreciated. No gait instability noted. Skin:  Skin is warm, dry and  intact. No rash noted. Psychiatric: Mood and affect are normal. Speech and behavior are normal.  ____________________________________________   LABS (all labs ordered are listed, but only abnormal results are displayed)  Labs  Reviewed  RESP PANEL BY RT-PCR (FLU A&B, COVID) ARPGX2  CBC WITH DIFFERENTIAL/PLATELET  COMPREHENSIVE METABOLIC PANEL  MAGNESIUM  PROTIME-INR  APTT  TROPONIN I (HIGH SENSITIVITY)   ____________________________________________  12 Lead EKG  Twelve-lead EKG reviewed by me demonstrates inferior STEMI.  Sinus rhythm with rates in the 130s on our twelve-lead, rate in the 60s with prehospital EKG.  ____________________________________________  [place]  ED MD interpretation:   Official radiology report(s): No results found.  ____________________________________________   PROCEDURES and INTERVENTIONS  Procedure(s) performed (including Critical Care):  .1-3 Lead EKG Interpretation  Date/Time: 12/20/2020 3:14 PM Performed by: Vladimir Crofts, MD Authorized by: Vladimir Crofts, MD     Interpretation: abnormal     ECG rate:  134   ECG rate assessment: tachycardic     Rhythm: sinus rhythm     Ectopy: PAC     Conduction: normal   .Critical Care  Date/Time: 12/20/2020 3:15 PM Performed by: Vladimir Crofts, MD Authorized by: Vladimir Crofts, MD   Critical care provider statement:    Critical care time (minutes):  30   Critical care was necessary to treat or prevent imminent or life-threatening deterioration of the following conditions:  Cardiac failure   Critical care was time spent personally by me on the following activities:  Discussions with consultants, evaluation of patient's response to treatment, examination of patient, ordering and performing treatments and interventions, ordering and review of laboratory studies, ordering and review of radiographic studies, pulse oximetry, re-evaluation of patient's condition, obtaining history from patient or surrogate and review of old charts  Medications  midazolam (VERSED) injection (2 mg Intravenous Given 12/20/20 1507)  lidocaine (PF) (XYLOCAINE) 1 % injection (3 mLs Infiltration Given 12/20/20 1508)  Heparin (Porcine) in NaCl 2000-0.9 UNIT/L-%  SOLN (1,000 mLs  Given 12/20/20 1508)  verapamil (ISOPTIN) injection (2.5 mg Intravenous Given 12/20/20 1511)  heparin injection 4,000 Units (has no administration in time range)  sodium chloride 0.9 % bolus 1,000 mL (has no administration in time range)  fentaNYL (SUBLIMAZE) 50 MCG/ML injection (50 mcg Intravenous Given 12/20/20 1453)  metoprolol tartrate (LOPRESSOR) 5 MG/5ML injection (5 mg Intravenous Given 12/20/20 1454)    ____________________________________________   MDM / ED COURSE   65 year old male presents to the ED with field inferior STEMI requiring emergent left heart catheterization.  EMS transmitted EKG with inferior STEMI.  He progresses to rapid rates just prior to ED arrival, requiring IV metoprolol.  Cardiology sees the patient in the ED and recommends heparinization and left heart cath emergently.  Remains hemodynamically stable with IV fluids.      ____________________________________________   FINAL CLINICAL IMPRESSION(S) / ED DIAGNOSES  Final diagnoses:  ST elevation myocardial infarction (STEMI) involving other coronary artery of inferior wall ([place])  Other chest pain     ED Discharge Orders     None        Chenelle Benning   Note:  This document was prepared using Dragon voice recognition software and may include unintentional dictation errors.    Vladimir Crofts, MD 12/20/20 [id number]

## 2020-12-20 NOTE — Progress Notes (Signed)
  Chaplain On-Call was unable to respond immediately to the Code STEMI notification due to a call from Labor and Delivery Observation for urgent support after a Fetal Loss.  Chaplain located this patient in [place] after his procedure in the Cath Lab.  Patient is not available for a visit at this time.  Chaplain will remain available for support as needed.  Chaplain Pollyann Samples M.Div., [facility]

## 2020-12-20 NOTE — Progress Notes (Addendum)
  Amiodarone Drug - Drug Interaction Consult Note  Recommendations: Carvedilol - vitals to be monitored. Atorvastatin 80 mg. Pt to report any muscle pain/weakness  Amiodarone is metabolized by the cytochrome P450 system and therefore has the potential to cause many drug interactions. Amiodarone has an average plasma half-life of 50 days (range 20 to 100 days).   There is potential for drug interactions to occur several weeks or months after stopping treatment and the onset of drug interactions may be slow after initiating amiodarone.   '[x]'$  Statins: Increased risk of myopathy. Simvastatin- restrict dose to '20mg'$  daily. Other statins: counsel patients to report any muscle pain or weakness immediately.  '[]'$  Anticoagulants: Amiodarone can increase anticoagulant effect. Consider warfarin dose reduction. Patients should be monitored closely and the dose of anticoagulant altered accordingly, remembering that amiodarone levels take several weeks to stabilize.  '[]'$  Antiepileptics: Amiodarone can increase plasma concentration of phenytoin, the dose should be reduced. Note that small changes in phenytoin dose can result in large changes in levels. Monitor patient and counsel on signs of toxicity.  '[x]'$  Beta blockers: increased risk of bradycardia, AV block and myocardial depression. Sotalol - avoid concomitant use.  '[]'$   Calcium channel blockers (diltiazem and verapamil): increased risk of bradycardia, AV block and myocardial depression.  '[]'$   Cyclosporine: Amiodarone increases levels of cyclosporine. Reduced dose of cyclosporine is recommended.  '[]'$  Digoxin dose should be halved when amiodarone is started.  '[]'$  Diuretics: increased risk of cardiotoxicity if hypokalemia occurs.  '[]'$  Oral hypoglycemic agents (glyburide, glipizide, glimepiride): increased risk of hypoglycemia. Patient's glucose levels should be monitored closely when initiating amiodarone therapy.   '[]'$  Drugs that prolong the QT interval:   Torsades de pointes risk may be increased with concurrent use - avoid if possible.  Monitor QTc, also keep magnesium/potassium WNL if concurrent therapy can't be avoided.  Antibiotics: e.g. fluoroquinolones, erythromycin.  Antiarrhythmics: e.g. quinidine, procainamide, disopyramide, sotalol.  Antipsychotics: e.g. phenothiazines, haloperidol.   Lithium, tricyclic antidepressants, and methadone. Thank You,  Forde Dandy Darryl Gilbert  12/20/2020 3:54 PM

## 2020-12-20 NOTE — ED Triage Notes (Signed)
Pt arrived via EMS with chest pain 30 prior to EMS arrival. Pt is having chest pain at this time.Pt called a STEMI in the field. Pt arrived in SVT from EMS.

## 2020-12-20 NOTE — Progress Notes (Signed)
Pt arrived to ICU room 2 from cath lab;   Amiodarone gtt infusing at 33.3 ml/hr;  Pt alert and oriented;  2 L [place];  Sinus Bradycardia/Sinus Rhythm noted;  ST elevation and ectopy noted; MD at bedside;  12 lead EKG completed;  PIVs intact;   Tylenol given for shoulder pain  Rectal temp obtained due to unable to obtain temp orally, or axillary;  TR band with 13 cc of air; Release in progress  KRAY, RN

## 2020-12-20 NOTE — Consult Note (Signed)
Cardiology Consultation:   Patient ID: Darryl Gilbert MRN: [id number]; DOB: 05/09/1955  Admit date: 12/20/2020 Date of Consult: 12/20/2020  PCP:  Patient, No Pcp Per (Inactive)   [facility] Providers Cardiologist:  None    Darryl [name])     Patient Profile:   Darryl Gilbert is a 65 y.o. male with a hx of essential hypertension and tobacco use who is being seen 12/20/2020 for the evaluation of inferior ST elevation myocardial infarction at the request of Dr. Tamala Julian.  History of Present Illness:   Darryl Gilbert is a 65 year old male with no prior cardiac history.  He has known history of essential hypertension, tobacco use and chronic back pain.  He presented with acute onset of substernal chest pain radiating to his neck and left arm.  The pain was described as heavy discomfort and was rated as 10 out of 10.  It was associated with dizziness and shortness of breath.  His fiance called EMS and the patient was found to have significant anterior ST elevation on his EKG and thus a code STEMI was activated.  The patient was in severe pain that required 50 mcg of fentanyl by EMS and was having significant pain on arrival to the ED with hypertension and tachycardia.  He was noted to be in A. fib with RVR with persistent inferior ST elevation.  He was given 5 mg of IV metoprolol and 4000 units of unfractioned heparin.  I recommended proceeding with emergent cardiac catheterization and possible PCI.   Past Medical History:  Diagnosis Date   Back pain    Hypertension     History reviewed. No pertinent surgical history.   Home Medications:  Prior to Admission medications   Medication Sig Start Date End Date Taking? Authorizing Provider  cyclobenzaprine (FLEXERIL) 10 MG tablet Take 1 tablet (10 mg total) by mouth 3 (three) times daily as needed. 12/12/17   Sable Feil, PA-C  losartan (COZAAR) 100 MG tablet Take 100 mg by mouth daily.    [provider]  meloxicam (MOBIC) 15 MG tablet Take 1  tablet (15 mg total) by mouth daily. 12/12/17   Sable Feil, PA-C  olmesartan (BENICAR) 20 MG tablet Take 20 mg by mouth daily.    [provider]  oxyCODONE-acetaminophen (PERCOCET) 7.5-325 MG tablet Take 1 tablet by mouth every 6 (six) hours as needed. 12/12/17   Sable Feil, PA-C    Inpatient Medications: Scheduled Meds:  amiodarone  150 mg Intravenous Once   aspirin  81 mg Oral Daily   atorvastatin  80 mg Oral Daily   carvedilol  3.125 mg Oral BID WC   [START ON 12/21/2020] Chlorhexidine Gluconate Cloth  6 each Topical Q0600   [START ON 12/21/2020] enoxaparin (LOVENOX) injection  40 mg Subcutaneous Q24H   [START ON 12/21/2020] sodium chloride flush  3 mL Intravenous Q12H   ticagrelor  90 mg Oral BID   Continuous Infusions:  [START ON 12/21/2020] sodium chloride     sodium chloride     amiodarone 60 mg/hr (12/20/20 1555)   Followed by   amiodarone     PRN Meds: [START ON 12/21/2020] sodium chloride, acetaminophen, cyclobenzaprine, ondansetron (ZOFRAN) IV, [START ON 12/21/2020] sodium chloride flush  Allergies:   No Known Allergies  Social History:   Social History   Socioeconomic History   Marital status: Divorced    Spouse name: Not on file   Number of children: Not on file   Years of education:  Not on file   Highest education level: Not on file  Occupational History   Not on file  Tobacco Use   Smoking status: Every Day   Smokeless tobacco: Never  Substance and Sexual Activity   Alcohol use: Not on file   Drug use: Not on file   Sexual activity: Not on file  Other Topics Concern   Not on file  Social History Narrative   Not on file   Social Determinants of Health   Financial Resource Strain: Not on file  Food Insecurity: Not on file  Transportation Needs: Not on file  Physical Activity: Not on file  Stress: Not on file  Social Connections: Not on file  Intimate Partner Violence: Not on file    Family History:   History reviewed. No pertinent  family history.   ROS:  Please see the history of present illness.   All other ROS reviewed and negative.     Physical Exam/Data:   Vitals:   12/20/20 1455 12/20/20 1456 12/20/20 1528 12/20/20 1641  BP: (!) 146/116   (!) 83/68  Pulse: 76 84  77  Resp: (!) '23 17  18  '$ SpO2: 100% 100%  91%  Weight:    93.6 kg  Height:   '6\' 1"'$  (1.854 m)    No intake or output data in the 24 hours ending 12/20/20 1659 Last 3 Weights 12/20/2020 12/20/2020  Weight (lbs) 206 lb 5.6 oz 212 lb  Weight (kg) 93.6 kg 96.163 kg     Body mass index is 27.22 kg/m.  General:  Well nourished, well developed, severe distress due to pain. HEENT: normal Lymph: no adenopathy Neck: no JVD Endocrine:  No thryomegaly Vascular: No carotid bruits; FA pulses 2+ bilaterally without bruits  Cardiac:  normal S1, S2; irregularly irregular and tachycardic; no murmur  Lungs:  clear to auscultation bilaterally, no wheezing, rhonchi or rales  Abd: soft, nontender, no hepatomegaly  Ext: no edema Musculoskeletal:  No deformities, BUE and BLE strength normal and equal Skin: warm and dry  Neuro:  CNs 2-12 intact, no focal abnormalities noted Psych:  Normal affect   EKG:  The EKG was personally reviewed and demonstrates: Atrial fibrillation with RVR with evidence of significant inferior ST elevation. Telemetry:  Telemetry was personally reviewed and demonstrates:    Relevant CV Studies:   Laboratory Data:  High Sensitivity Troponin:   Recent Labs  Lab 12/20/20 1448  TROPONINIHS 19*     Chemistry Recent Labs  Lab 12/20/20 1448  NA 135  K 3.9  CL 102  CO2 23  GLUCOSE 139*  BUN 22  CREATININE 1.63*  CALCIUM 9.6  GFRNONAA 47*  ANIONGAP 10    Recent Labs  Lab 12/20/20 1448  PROT 7.5  ALBUMIN 4.1  AST 30  ALT 22  ALKPHOS 49  BILITOT 1.0   Hematology Recent Labs  Lab 12/20/20 1448  WBC 9.9  RBC 4.69  HGB 15.2  HCT 42.9  MCV 91.5  MCH 32.4  MCHC 35.4  RDW 13.3  PLT 250   BNPNo results for  input(s): BNP, PROBNP in the last 168 hours.  DDimer No results for input(s): DDIMER in the last 168 hours.   Radiology/Studies:  CARDIAC CATHETERIZATION  Result Date: 12/20/2020   Ost RCA to Prox RCA lesion is 70% stenosed.   Prox Cx to Mid Cx lesion is 100% stenosed.   1st Mrg lesion is 60% stenosed.   A drug-eluting stent was successfully placed using a STENT  ONYX FRONTIER 3.0X22.   Post intervention, there is a 0% residual stenosis.   There is moderate left ventricular systolic dysfunction.   LV end diastolic pressure is moderately elevated.   The left ventricular ejection fraction is 35-45% by visual estimate. 1.  Codominant coronary arteries with thrombotic occlusion of the proximal left circumflex which is the culprit for inferior STEMI.  There is also 70% stenosis in ostial right coronary artery which has high anterior takeoff.  Catheter induced spasm in the ostial right coronary artery cannot be completely excluded. 2.  Moderately reduced LV systolic function with segmental wall motion abnormalities in the left circumflex distribution.  Moderately elevated left ventricular end-diastolic pressure at 22 mmHg. 3.  Successful angioplasty and drug-eluting stent placement to the left circumflex. Recommendations: Dual antiplatelet therapy for at least 12 months. Aggressive treatment of risk factors and smoking cessation. The patient had atrial fibrillation on presentation but converted sinus rhythm at the end of the case with amiodarone drip.  Continue amiodarone drip for now and monitor rhythm closely.     Assessment and Plan:   Inferior ST elevation myocardial infarction: The patient was given aspirin, metoprolol and unfractionated heparin.  Emergent cardiac catheterization was done via the right radial artery which showed occluded left circumflex which was the culprit.  This was treated successfully with PCI and drug-eluting stent placement.  Recommend dual antiplatelet therapy with aspirin and  ticagrelor for at least 12 months.  Recommend aggressive treatment of risk factors. 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  His EF was moderately reduced with wall motion abnormalities in the left circumflex distribution.  We will start small dose carvedilol if blood pressure tolerates and plan on initiating treatment with an ARB once hypotension resolves.  Monitor volume status closely as his LVEDP was moderately reduced.  No need for diuresis at this time but can be considered based on his respiratory status. Atrial fibrillation with RVR: Likely due to myocardial ischemia.  Continue amiodarone drip for now.  Suspect that his atrial arrhythmia is likely transient. Tobacco use: I discussed the importance of smoking cessation.   Risk Assessment/Risk Scores:     TIMI Risk Score for ST  Elevation MI:   The patient's TIMI risk score is 3, which indicates a 4.4% risk of all cause mortality at 30 days.{   For questions or updates, please contact [place] Please consult [email or web address] for contact info under    Signed, [name], MD  12/20/2020 4:59 PM

## 2020-12-21 ENCOUNTER — Inpatient Hospital Stay (HOSPITAL_COMMUNITY)
Admit: 2020-12-21 | Discharge: 2020-12-21 | Disposition: A | Discharge status: Home / Self Care | Payer: Self-pay | Attending: Cardiovascular Disease | Admitting: Cardiovascular Disease

## 2020-12-21 ENCOUNTER — Inpatient Hospital Stay: Payer: Self-pay

## 2020-12-21 ENCOUNTER — Encounter: Payer: Self-pay | Admitting: Family Medicine

## 2020-12-21 DIAGNOSIS — I219 Acute myocardial infarction, unspecified: Secondary | ICD-10-CM

## 2020-12-21 DIAGNOSIS — F17200 Nicotine dependence, unspecified, uncomplicated: Secondary | ICD-10-CM

## 2020-12-21 LAB — ECHOCARDIOGRAM COMPLETE
AR max vel: 1.93 cm2
AV Peak grad: 5.7 mmHg
Ao pk vel: 1.19 m/s
Area-P 1/2: 2.6 cm2
Height: 73 in
S' Lateral: 3.4 cm
Single Plane A2C EF: 45.8 %
Weight: 3301.61 oz

## 2020-12-21 LAB — BASIC METABOLIC PANEL
Anion gap: 8 (ref 5–15)
BUN: 26 mg/dL — ABNORMAL HIGH (ref 8–23)
CO2: 23 mmol/L (ref 22–32)
Calcium: 9.1 mg/dL (ref 8.9–10.3)
Chloride: 104 mmol/L (ref 98–111)
Creatinine, Ser: 1.33 mg/dL — ABNORMAL HIGH (ref 0.61–1.24)
GFR, Estimated: 60 mL/min — ABNORMAL LOW (ref >60)
Glucose, Bld: 102 mg/dL — ABNORMAL HIGH (ref 70–99)
Potassium: 3.9 mmol/L (ref 3.5–5.1)
Sodium: 135 mmol/L (ref 135–145)

## 2020-12-21 LAB — HEPATIC FUNCTION PANEL
ALT: 69 U/L — ABNORMAL HIGH (ref 0–44)
AST: 318 U/L — ABNORMAL HIGH (ref 15–41)
Albumin: 3.7 g/dL (ref 3.5–5.0)
Alkaline Phosphatase: 40 U/L (ref 38–126)
Bilirubin, Direct: <0.1 mg/dL (ref 0.0–0.2)
Total Bilirubin: 0.6 mg/dL (ref 0.3–1.2)
Total Protein: 6.8 g/dL (ref 6.5–8.1)

## 2020-12-21 LAB — LIPID PANEL
Cholesterol: 215 mg/dL — ABNORMAL HIGH (ref 0–200)
HDL: 27 mg/dL — ABNORMAL LOW (ref >40)
LDL Cholesterol: 134 mg/dL — ABNORMAL HIGH (ref 0–99)
Total CHOL/HDL Ratio: 8 RATIO
Triglycerides: 270 mg/dL — ABNORMAL HIGH (ref <150)
VLDL: 54 mg/dL — ABNORMAL HIGH (ref 0–40)

## 2020-12-21 LAB — CBC
HCT: 39.9 % (ref 39.0–52.0)
Hemoglobin: 13.8 g/dL (ref 13.0–17.0)
MCH: 31.6 pg (ref 26.0–34.0)
MCHC: 34.6 g/dL (ref 30.0–36.0)
MCV: 91.3 fL (ref 80.0–100.0)
Platelets: 185 10*3/uL (ref 150–400)
RBC: 4.37 MIL/uL (ref 4.22–5.81)
RDW: 13.6 % (ref 11.5–15.5)
WBC: 10.6 10*3/uL — ABNORMAL HIGH (ref 4.0–10.5)
nRBC: 0 % (ref 0.0–0.2)

## 2020-12-21 LAB — TSH: TSH: 1.189 u[IU]/mL (ref 0.350–4.500)

## 2020-12-21 MED ORDER — LOSARTAN POTASSIUM 50 MG PO TABS
25.0000 mg | ORAL_TABLET | Freq: Every day | ORAL | Status: DC
  Administered 2020-12-21 – 2020-12-26 (×5): 25 mg via ORAL
  Filled 2020-12-21 (×6): qty 1

## 2020-12-21 MED ORDER — ATORVASTATIN CALCIUM 20 MG PO TABS
80.0000 mg | ORAL_TABLET | Freq: Every day | ORAL | Status: DC
  Administered 2020-12-21 – 2020-12-26 (×6): 80 mg via ORAL
  Filled 2020-12-21 (×6): qty 4

## 2020-12-21 MED ORDER — FUROSEMIDE 10 MG/ML IJ SOLN
INTRAMUSCULAR | Status: AC
  Administered 2020-12-21: 20 mg via INTRAVENOUS
  Filled 2020-12-21: qty 2

## 2020-12-21 MED ORDER — FUROSEMIDE 10 MG/ML IJ SOLN
40.0000 mg | Freq: Once | INTRAMUSCULAR | Status: AC
  Administered 2020-12-21: 40 mg via INTRAVENOUS
  Filled 2020-12-21: qty 4

## 2020-12-21 MED ORDER — FUROSEMIDE 10 MG/ML IJ SOLN: 20.0000 mg | Freq: Once | INTRAMUSCULAR | Status: AC

## 2020-12-21 MED ORDER — MORPHINE SULFATE (PF) 4 MG/ML IV SOLN
3.0000 mg | Freq: Once | INTRAVENOUS | Status: AC | PRN
Start: 2020-12-21 — End: 2020-12-21
  Administered 2020-12-21: 3 mg via INTRAVENOUS
  Filled 2020-12-21: qty 1

## 2020-12-21 NOTE — Progress Notes (Signed)
  Amiodarone Drug - Drug Interaction Consult Note  Recommendations: no current recommended changes to therapy  Amiodarone is metabolized by the cytochrome P450 system and therefore has the potential to cause many drug interactions. Amiodarone has an average plasma half-life of 50 days (range 20 to 100 days).   There is potential for drug interactions to occur several weeks or months after stopping treatment and the onset of drug interactions may be slow after initiating amiodarone.   '[]'$  Statins: Increased risk of myopathy. Simvastatin- restrict dose to '20mg'$  daily. Other statins: counsel patients to report any muscle pain or weakness immediately.  '[]'$  Anticoagulants: Amiodarone can increase anticoagulant effect. Consider warfarin dose reduction. Patients should be monitored closely and the dose of anticoagulant altered accordingly, remembering that amiodarone levels take several weeks to stabilize.  '[]'$  Antiepileptics: Amiodarone can increase plasma concentration of phenytoin, the dose should be reduced. Note that small changes in phenytoin dose can result in large changes in levels. Monitor patient and counsel on signs of toxicity.  '[x]'$  Beta blockers: increased risk of bradycardia, AV block and myocardial depression. Sotalol - avoid concomitant use.  '[]'$   Calcium channel blockers (diltiazem and verapamil): increased risk of bradycardia, AV block and myocardial depression.  '[]'$   Cyclosporine: Amiodarone increases levels of cyclosporine. Reduced dose of cyclosporine is recommended.  '[]'$  Digoxin dose should be halved when amiodarone is started.  '[]'$  Diuretics: increased risk of cardiotoxicity if hypokalemia occurs.  '[]'$  Oral hypoglycemic agents (glyburide, glipizide, glimepiride): increased risk of hypoglycemia. Patient's glucose levels should be monitored closely when initiating amiodarone therapy.   '[x]'$  Drugs that prolong the QT interval:  Torsades de pointes risk may be increased with concurrent  use - avoid if possible.  Monitor QTc, also keep magnesium/potassium WNL if concurrent therapy can't be avoided.  Antibiotics: e.g. fluoroquinolones, erythromycin.  Antiarrhythmics: e.g. quinidine, procainamide, disopyramide, sotalol.  Antipsychotics: e.g. phenothiazines, haloperidol.   Lithium, tricyclic antidepressants, and methadone.  Thank You,  Dallie Piles  12/21/2020 10:08 AM

## 2020-12-21 NOTE — Progress Notes (Signed)
*  PRELIMINARY RESULTS* Echocardiogram 2D Echocardiogram has been performed.  Darryl Gilbert 12/21/2020, 9:55 AM

## 2020-12-21 NOTE — Progress Notes (Signed)
Progress Note  Patient Name: Darryl Gilbert Date of Encounter: 12/21/2020  Primary Cardiologist: New, Dr. [name]  Subjective   Reports shortness of breath that started last night.  He is unable to lay flat.  He had a difficult time trying to sleep last night.  He reports chest tightness in the center of his chest and usually associated with his shortness of breath.  Each episode of chest tightness and SOB lasts a couple of minutes before dissipating.  No racing heart rate or palpitations. No pain or discomfort from his right radial arteriotomy site.  Inpatient Medications    Scheduled Meds:  amiodarone  150 mg Intravenous Once   aspirin  81 mg Oral Daily   atorvastatin  80 mg Oral Daily   carvedilol  3.125 mg Oral BID WC   Chlorhexidine Gluconate Cloth  6 each Topical Q0600   enoxaparin (LOVENOX) injection  40 mg Subcutaneous Q24H   sodium chloride flush  3 mL Intravenous Q12H   ticagrelor  90 mg Oral BID   Continuous Infusions:  sodium chloride 1,000 mL (12/21/20 0946)   amiodarone 30 mg/hr (12/21/20 1037)   PRN Meds: sodium chloride, acetaminophen, cyclobenzaprine, ondansetron (ZOFRAN) IV, sodium chloride flush   Vital Signs    Vitals:   12/21/20 0700 12/21/20 0800 12/21/20 0900 12/21/20 1000  BP: 126/75 140/75 (!) 134/92 139/83  Pulse: 64 68 66 63  Resp: 17 18 (!) 24 13  Temp:      TempSrc:      SpO2: 100% 99% 100% 100%  Weight:      Height:        Intake/Output Summary (Last 24 hours) at 12/21/2020 1058 Last data filed at 12/21/2020 1000 Gross per 24 hour  Intake 2019.42 ml  Output 1760 ml  Net 259.42 ml   Last 3 Weights 12/20/2020 12/20/2020  Weight (lbs) 206 lb 5.6 oz 212 lb  Weight (kg) 93.6 kg 96.163 kg      Telemetry    NSR, 60-70s, PVCs, NSVT (frequent episodes NSVT about 12 beats), earlier A. fib- Personally Reviewed  ECG    No new tracings- Personally Reviewed  Physical Exam   GEN: No acute distress.  Nasal cannula oxygen in place. Neck: No  JVD Cardiac: RRR, no murmurs, rubs, or gallops.  Right radial arteriotomy site without pain, swelling, infection Respiratory: Right base crackles. GI: Firm/slightly distended, nontender   MS: No edema; No deformity. Neuro:  Nonfocal  Psych: Normal affect   Labs    High Sensitivity Troponin:   Recent Labs  Lab 12/20/20 1448 12/20/20 1730 12/20/20 1939 12/20/20 2128  TROPONINIHS 19* 4,521* >24,000* >24,000*      Chemistry Recent Labs  Lab 12/20/20 1448 12/21/20 0543  NA 135 135  K 3.9 3.9  CL 102 104  CO2 23 23  GLUCOSE 139* 102*  BUN 22 26*  CREATININE 1.63* 1.33*  CALCIUM 9.6 9.1  PROT 7.5 6.8  ALBUMIN 4.1 3.7  AST 30 318*  ALT 22 69*  ALKPHOS 49 40  BILITOT 1.0 0.6  GFRNONAA 47* 60*  ANIONGAP 10 8     Hematology Recent Labs  Lab 12/20/20 1448 12/21/20 0543  WBC 9.9 10.6*  RBC 4.69 4.37  HGB 15.2 13.8  HCT 42.9 39.9  MCV 91.5 91.3  MCH 32.4 31.6  MCHC 35.4 34.6  RDW 13.3 13.6  PLT 250 185    BNPNo results for input(s): BNP, PROBNP in the last 168 hours.   DDimer No results  for input(s): DDIMER in the last 168 hours.   Radiology    CARDIAC CATHETERIZATION  Result Date: 12/20/2020   Ost RCA to Prox RCA lesion is 70% stenosed.   Prox Cx to Mid Cx lesion is 100% stenosed.   1st Mrg lesion is 60% stenosed.   A drug-eluting stent was successfully placed using a STENT ONYX FRONTIER 3.0X22.   Post intervention, there is a 0% residual stenosis.   There is moderate left ventricular systolic dysfunction.   LV end diastolic pressure is moderately elevated.   The left ventricular ejection fraction is 35-45% by visual estimate. 1.  Codominant coronary arteries with thrombotic occlusion of the proximal left circumflex which is the culprit for inferior STEMI.  There is also 70% stenosis in ostial right coronary artery which has high anterior takeoff.  Catheter induced spasm in the ostial right coronary artery cannot be completely excluded. 2.  Moderately reduced LV  systolic function with segmental wall motion abnormalities in the left circumflex distribution.  Moderately elevated left ventricular end-diastolic pressure at 22 mmHg. 3.  Successful angioplasty and drug-eluting stent placement to the left circumflex. Recommendations: Dual antiplatelet therapy for at least 12 months. Aggressive treatment of risk factors and smoking cessation. The patient had atrial fibrillation on presentation but converted sinus rhythm at the end of the case with amiodarone drip.  Continue amiodarone drip for now and monitor rhythm closely.   DG Chest Port 1 View  Result Date: 12/21/2020 CLINICAL DATA:  Respiratory distress EXAM: PORTABLE CHEST 1 VIEW COMPARISON:  None. FINDINGS: Normal heart size. Normal mediastinal contour. No pneumothorax. No pleural effusion. Lungs appear clear, with no acute consolidative airspace disease and no pulmonary edema. IMPRESSION: No active disease. Electronically Signed   By: Ilona Sorrel M.D.   On: 12/21/2020 07:10   ECHOCARDIOGRAM COMPLETE  Result Date: 12/21/2020    ECHOCARDIOGRAM REPORT   Patient Name:   Darryl Gilbert [facility] Date of Exam: 12/21/2020 Medical Rec #:  [id number]    Height:       73.0 in Accession #:    [id number]   Weight:       206.3 lb Date of Birth:  24-May-1955    BSA:          2.180 m Patient Age:    65 years     BP:           126/75 mmHg Patient Gender: M            HR:           62 bpm. Exam Location:  [facility] Procedure: 2D Echo, 3D Echo and Strain Analysis Indications:     AMI I21.9  History:         Patient has no prior history of Echocardiogram examinations.  Sonographer:     Kathlen Brunswick RDCS Referring Phys:  [id number] A ARIDA Diagnosing Phys: Kate Sable MD  Sonographer Comments: Global longitudinal strain was attempted. IMPRESSIONS  1. Left ventricular ejection fraction, by estimation, is 50%. The left ventricle has low normal function. The left ventricle demonstrates regional wall motion abnormalities (see scoring  diagram/findings for description). There is mild left ventricular hypertrophy. Left ventricular diastolic parameters are consistent with Grade II diastolic dysfunction (pseudonormalization). There is moderate hypokinesis of the left ventricular, entire inferolateral wall.  2. Right ventricular systolic function is normal. The right ventricular size is normal.  3. The mitral valve is normal in structure. No evidence of mitral valve regurgitation.  4. The aortic valve  is tricuspid. Aortic valve regurgitation is not visualized. FINDINGS  Left Ventricle: Left ventricular ejection fraction, by estimation, is 50%. The left ventricle has low normal function. The left ventricle demonstrates regional wall motion abnormalities. Moderate hypokinesis of the left ventricular, entire inferolateral  wall. Global longitudinal strain performed but not reported based on interpreter judgement due to suboptimal tracking. 3D left ventricular ejection fraction analysis performed but not reported based on interpreter judgement due to suboptimal quality. The left ventricular internal cavity size was normal in size. There is mild left ventricular hypertrophy. Left ventricular diastolic parameters are consistent with Grade II diastolic dysfunction (pseudonormalization). Right Ventricle: The right ventricular size is normal. No increase in right ventricular wall thickness. Right ventricular systolic function is normal. Left Atrium: Left atrial size was normal in size. Right Atrium: Right atrial size was normal in size. Pericardium: There is no evidence of pericardial effusion. Mitral Valve: The mitral valve is normal in structure. No evidence of mitral valve regurgitation. Tricuspid Valve: The tricuspid valve is normal in structure. Tricuspid valve regurgitation is not demonstrated. Aortic Valve: The aortic valve is tricuspid. Aortic valve regurgitation is not visualized. Aortic valve peak gradient measures 5.7 mmHg. Pulmonic Valve: The  pulmonic valve was not well visualized. Pulmonic valve regurgitation is not visualized. Aorta: The aortic root is normal in size and structure. Venous: The inferior vena cava was not well visualized. IAS/Shunts: No atrial level shunt detected by color flow Doppler.  LEFT VENTRICLE PLAX 2D LVIDd:         4.90 cm      Diastology LVIDs:         3.40 cm      LV e' medial:    5.66 cm/s LV PW:         1.10 cm      LV E/e' medial:  12.9 LV IVS:        1.30 cm      LV e' lateral:   4.35 cm/s LVOT diam:     2.00 cm      LV E/e' lateral: 16.7 LV SV:         40 LV SV Index:   18 LVOT Area:     3.14 cm                              3D Volume EF: LV Volumes (MOD)            3D EF:        55 % LV vol d, MOD A2C: 139.0 ml LV EDV:       246 ml LV vol s, MOD A2C: 75.4 ml  LV ESV:       110 ml LV SV MOD A2C:     63.6 ml  LV SV:        135 ml RIGHT VENTRICLE RV Basal diam:  2.80 cm RV S prime:     13.10 cm/s TAPSE (M-mode): 1.9 cm LEFT ATRIUM             Index       RIGHT ATRIUM           Index LA diam:        2.90 cm 1.33 cm/m  RA Area:     10.90 cm LA Vol (A2C):   46.7 ml 21.42 ml/m RA Volume:   26.00 ml  11.92 ml/m LA Vol (A4C):   37.2 ml 17.06 ml/m LA  Biplane Vol: 41.9 ml 19.22 ml/m  AORTIC VALVE AV Area (Vmax): 1.93 cm AV Vmax:        119.00 cm/s AV Peak Grad:   5.7 mmHg LVOT Vmax:      73.10 cm/s LVOT Vmean:     43.500 cm/s LVOT VTI:       0.126 m  AORTA Ao Root diam: 3.30 cm MITRAL VALVE MV Area (PHT): 2.60 cm    SHUNTS MV Decel Time: 292 msec    Systemic VTI:  0.13 m MV E velocity: 72.80 cm/s  Systemic Diam: 2.00 cm MV A velocity: 53.10 cm/s MV E/A ratio:  1.37 Kate Sable MD Electronically signed by Kate Sable MD Signature Date/Time: 12/21/2020/10:34:11 AM    Final     Cardiac Studies   Echo 12/21/20  1. Left ventricular ejection fraction, by estimation, is 50%. The left  ventricle has low normal function. The left ventricle demonstrates  regional wall motion abnormalities (see scoring  diagram/findings for  description). There is mild left ventricular  hypertrophy. Left ventricular diastolic parameters are consistent with  Grade II diastolic dysfunction (pseudonormalization). There is moderate  hypokinesis of the left ventricular, entire inferolateral wall.   2. Right ventricular systolic function is normal. The right ventricular  size is normal.   3. The mitral valve is normal in structure. No evidence of mitral valve  regurgitation.   4. The aortic valve is tricuspid. Aortic valve regurgitation is not  visualized.   12/20/20 LHC Coronary Diagrams  Diagnostic Dominance: Co-dominant Intervention      Ost RCA to Prox RCA lesion is 70% stenosed.   Prox Cx to Mid Cx lesion is 100% stenosed.   1st Mrg lesion is 60% stenosed.   A drug-eluting stent was successfully placed using a STENT ONYX FRONTIER 3.0X22.   Post intervention, there is a 0% residual stenosis.   There is moderate left ventricular systolic dysfunction.   LV end diastolic pressure is moderately elevated.   The left ventricular ejection fraction is 35-45% by visual estimate. 1.  Codominant coronary arteries with thrombotic occlusion of the proximal left circumflex which is the culprit for inferior STEMI.  There is also 70% stenosis in ostial right coronary artery which has high anterior takeoff.  Catheter induced spasm in the ostial right coronary artery cannot be completely excluded. 2.  Moderately reduced LV systolic function with segmental wall motion abnormalities in the left circumflex distribution.  Moderately elevated left ventricular end-diastolic pressure at 22 mmHg. 3.  Successful angioplasty and drug-eluting stent placement to the left circumflex. Recommendations: Dual antiplatelet therapy for at least 12 months. Aggressive treatment of risk factors and smoking cessation. The patient had atrial fibrillation on presentation but converted sinus rhythm at the end of the case with amiodarone drip.   Continue amiodarone drip for now and monitor rhythm closely.    Patient Profile     65 y.o. 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  EKG showed anterior ST elevation per EMS and inferior ST elevation in the ED. HS Tn >2400.0. Emergent LHC showed codominant cor arteries with  thrombotic occlusion to the pLCx and culprit for inferior STEMI.  70%s noted in  RCA with high anterior takeoff with catheter induced spasm not able to be excluded.  LCx underwent PCI/DES (right radial).  Echo EF 50% with moderate hypokinesis of the LV and entire inferior lateral wall.  DAPT for at least 12 months with ASA, Brilinta Continue Coreg 3.125 mg BID Started losartan 25 mg daily Continue high intensity atorvastatin 80 mg daily Aggressive treatment of risk factors  Acute systolic heart failure/post MI cardiomyopathy -- Reports shortness of breath since last night that comes and goes with episodes lasting a couple minutes each.  EF moderately reduced by cath, wall motion abnormalities in left circumflex distribution.  Echo showed EF 50%, WMA, moderate hypokinesis of LV/entire inferior lateral wall, mild LVH, G2 DD.  Cath showed LVEDP 22 mmHg.  Initially, diuresis not started s/p LHC with recommendation to monitor volume status closely and start diuresis if indicated based on his respiratory status. Start gentle IV diuresis Monitor I/os, daily standing weights Daily BMET.  Maintain electrolytes at goal Cr currently 1.33, improving  Continue [place] O2, wean as  tolerated Continue Coreg Start losartan '25mg'$  qd Closely monitor pressures with medication regimen as above to avoid hypotension /prerenal AKI  Atrial fibrillation with RVR --Noted to be in A. fib with RVR at the time of code STEMI.  Suspected as 2/2 myocardial ischemia.  Currently NSR with NSVT.  Continue IV amiodarone.  A. fib suspected to be transient.  Check TSH. Maintain electrolytes at goal. Continue to monitor for on telemetry.  NSVT --Continue amiodarone. Maintain electrolytes at goal. Check TSH.  Tobacco use --Complete cessation advised  For questions or updates, please contact [place] Please consult [email or web address] for contact info under        Signed, Arvil Chaco, PA-C  12/21/2020, 10:58 AM

## 2020-12-21 NOTE — Progress Notes (Signed)
PROGRESS NOTE    Darryl Gilbert  [id number] DOB: [birth year] DOA: 12/20/2020 PCP: Patient, No Pcp Per (Inactive)  IC02A/IC02A-AA   Assessment & Plan:   Active Problems:   STEMI (ST elevation myocardial infarction) (HCC)   Acute ST elevation myocardial infarction (STEMI) of inferior wall (HCC)   Smoking   Darryl Gilbert is a 65 y.o. male with a reported history of hypertension, chronic back pain, current smoker, who presented via EMS with chest pain, found to have STEMI.   # Inferior lateral STEMI  status post DES placement to the proximal left circumflex artery. -- 70% RCA also noted. Plan: --cont ASA and Brilinta --cont lipitor (new) -Coreg 3.125 mg twice daily  Paroxysmal A. Fib -Occurred in the setting of acute MI --started on amiodarone gtt Plan: Continue amiodarone drip per protocol through today  HTN --cont coreg --start losartan 25 mg daily  HLD --LDL 134 --cont Lipitor (new)  Current smoker --50+ pack years --cessation advised  Dyspnea and orthopnea --maybe due to fluid overload --received IV lasix 20 mg early this morning with good urine output Plan: --repeat IV lasix 40 mg x1   DVT prophylaxis: Lovenox SQ Code Status: Full code  Family Communication: wife updated at bedside today Level of care: ICU Dispo:   The patient is from: home Anticipated d/c is to: home Anticipated d/c date is: 1-2 days Patient currently is not medically ready to d/c due to: post STEMI, need monitoring, per cardio   Subjective and Interval History:  Chest pain improved, still mild pain in the central chest area.  Pt complained of dyspnea, orthopnea.     Objective: Vitals:   12/21/20 1000 12/21/20 1100 12/21/20 1200 12/21/20 1300  BP: 139/83 (!) 141/86 128/73 115/80  Pulse: 63 64 63 66  Resp: 13 (!) 21 17 (!) 26  Temp:      TempSrc:      SpO2: 100% 100% 100% 100%  Weight:      Height:        Intake/Output Summary (Last 24 hours) at 12/21/2020 1735 Last data  filed at 12/21/2020 1600 Gross per 24 hour  Intake 3517.42 ml  Output 2960 ml  Net 557.42 ml   Filed Weights   12/20/20 1447 12/20/20 1641  Weight: 96.2 kg 93.6 kg    Examination:   Constitutional: NAD, AAOx3 HEENT: conjunctivae and lids normal, EOMI CV: No cyanosis.   RESP: normal respiratory effort, clear, on 2L Extremities: No effusions, edema in BLE SKIN: warm, dry Neuro: II - XII grossly intact.   Psych: Normal mood and affect.  Appropriate judgement and reason   Data Reviewed: I have personally reviewed following labs and imaging studies  CBC: Recent Labs  Lab 12/20/20 1448 12/21/20 0543  WBC 9.9 10.6*  NEUTROABS 6.1  --   HGB 15.2 13.8  HCT 42.9 39.9  MCV 91.5 91.3  PLT 250 [id number]   Basic Metabolic Panel: Recent Labs  Lab 12/20/20 1448 12/21/20 0543  NA 135 135  K 3.9 3.9  CL 102 104  CO2 23 23  GLUCOSE 139* 102*  BUN 22 26*  CREATININE 1.63* 1.33*  CALCIUM 9.6 9.1  MG 1.7  --    GFR: Estimated Creatinine Clearance: 63.4 mL/min (A) (by C-G formula based on SCr of 1.33 mg/dL (H)). Liver Function Tests: Recent Labs  Lab 12/20/20 1448 12/21/20 0543  AST 30 318*  ALT 22 69*  ALKPHOS 49 40  BILITOT 1.0 0.6  PROT 7.5 6.8  ALBUMIN  4.1 3.7   No results for input(s): LIPASE, AMYLASE in the last 168 hours. No results for input(s): AMMONIA in the last 168 hours. Coagulation Profile: Recent Labs  Lab 12/20/20 1448  INR 1.0   Cardiac Enzymes: No results for input(s): CKTOTAL, CKMB, CKMBINDEX, TROPONINI in the last 168 hours. BNP (last 3 results) No results for input(s): PROBNP in the last 8760 hours. HbA1C: No results for input(s): HGBA1C in the last 72 hours. CBG: Recent Labs  Lab 12/20/20 1642  GLUCAP 115*   Lipid Profile: Recent Labs    12/21/20 0543  CHOL 215*  HDL 27*  LDLCALC 134*  TRIG 270*  CHOLHDL 8.0   Thyroid Function Tests: Recent Labs    12/21/20 0543  TSH 1.189   Anemia Panel: No results for input(s):  VITAMINB12, FOLATE, FERRITIN, TIBC, IRON, RETICCTPCT in the last 72 hours. Sepsis Labs: No results for input(s): PROCALCITON, LATICACIDVEN in the last 168 hours.  Recent Results (from the past 240 hour(s))  Resp Panel by RT-PCR (Flu A&B, Covid) Nasopharyngeal Swab     Status: None   Collection Time: 12/20/20  2:48 PM   Specimen: Nasopharyngeal Swab; Nasopharyngeal(NP) swabs in vial transport medium  Result Value Ref Range Status   SARS Coronavirus 2 by RT PCR NEGATIVE NEGATIVE Final    Comment: (NOTE) SARS-CoV-2 target nucleic acids are NOT DETECTED.  The SARS-CoV-2 RNA is generally detectable in upper respiratory specimens during the acute phase of infection. The lowest concentration of SARS-CoV-2 viral copies this assay can detect is 138 copies/mL. A negative result does not preclude SARS-Cov-2 infection and should not be used as the sole basis for treatment or other patient management decisions. A negative result may occur with  improper specimen collection/handling, submission of specimen other than nasopharyngeal swab, presence of viral mutation(s) within the areas targeted by this assay, and inadequate number of viral copies(<138 copies/mL). A negative result must be combined with clinical observations, patient history, and epidemiological information. The expected result is Negative.  Fact Sheet for Patients:  EntrepreneurPulse.com.au  Fact Sheet for Healthcare Providers:  IncredibleEmployment.be  This test is no t yet approved or cleared by the [place] FDA and  has been authorized for detection and/or diagnosis of SARS-CoV-2 by FDA under an Emergency Use Authorization (EUA). This EUA will remain  in effect (meaning this test can be used) for the duration of the COVID-19 declaration under Section 564(b)(1) of the Act, 21 U.S.C.section 360bbb-3(b)(1), unless the authorization is terminated  or revoked sooner.       Influenza A  by PCR NEGATIVE NEGATIVE Final   Influenza B by PCR NEGATIVE NEGATIVE Final    Comment: (NOTE) The Xpert Xpress SARS-CoV-2/FLU/RSV plus assay is intended as an aid in the diagnosis of influenza from Nasopharyngeal swab specimens and should not be used as a sole basis for treatment. Nasal washings and aspirates are unacceptable for Xpert Xpress SARS-CoV-2/FLU/RSV testing.  Fact Sheet for Patients: EntrepreneurPulse.com.au  Fact Sheet for Healthcare Providers: IncredibleEmployment.be  This test is not yet approved or cleared by the [place] FDA and has been authorized for detection and/or diagnosis of SARS-CoV-2 by FDA under an Emergency Use Authorization (EUA). This EUA will remain in effect (meaning this test can be used) for the duration of the COVID-19 declaration under Section 564(b)(1) of the Act, 21 U.S.C. section 360bbb-3(b)(1), unless the authorization is terminated or revoked.  Performed at [facility], [address] [place], [place] [address]   MRSA Next Gen by  PCR, Nasal     Status: None   Collection Time: 12/20/20  4:43 PM   Specimen: Nasal Mucosa; Nasal Swab  Result Value Ref Range Status   MRSA by PCR Next Gen NOT DETECTED NOT DETECTED Final    Comment: (NOTE) The GeneXpert MRSA Assay (FDA approved for NASAL specimens only), is one component of a comprehensive MRSA colonization surveillance program. It is not intended to diagnose MRSA infection nor to guide or monitor treatment for MRSA infections. Test performance is not FDA approved in patients less than 59 years old. Performed at [facility], [address] [place], [place] [address]       Radiology Studies: CARDIAC CATHETERIZATION  Result Date: 12/20/2020   Ost RCA to Prox RCA lesion is 70% stenosed.   Prox Cx to Mid Cx lesion is 100% stenosed.   1st Mrg lesion is 60% stenosed.   A drug-eluting stent was successfully placed using a STENT  ONYX FRONTIER 3.0X22.   Post intervention, there is a 0% residual stenosis.   There is moderate left ventricular systolic dysfunction.   LV end diastolic pressure is moderately elevated.   The left ventricular ejection fraction is 35-45% by visual estimate. 1.  Codominant coronary arteries with thrombotic occlusion of the proximal left circumflex which is the culprit for inferior STEMI.  There is also 70% stenosis in ostial right coronary artery which has high anterior takeoff.  Catheter induced spasm in the ostial right coronary artery cannot be completely excluded. 2.  Moderately reduced LV systolic function with segmental wall motion abnormalities in the left circumflex distribution.  Moderately elevated left ventricular end-diastolic pressure at 22 mmHg. 3.  Successful angioplasty and drug-eluting stent placement to the left circumflex. Recommendations: Dual antiplatelet therapy for at least 12 months. Aggressive treatment of risk factors and smoking cessation. The patient had atrial fibrillation on presentation but converted sinus rhythm at the end of the case with amiodarone drip.  Continue amiodarone drip for now and monitor rhythm closely.   DG Chest Port 1 View  Result Date: 12/21/2020 CLINICAL DATA:  Respiratory distress EXAM: PORTABLE CHEST 1 VIEW COMPARISON:  None. FINDINGS: Normal heart size. Normal mediastinal contour. No pneumothorax. No pleural effusion. Lungs appear clear, with no acute consolidative airspace disease and no pulmonary edema. IMPRESSION: No active disease. Electronically Signed   By: Ilona Sorrel M.D.   On: 12/21/2020 07:10   ECHOCARDIOGRAM COMPLETE  Result Date: 12/21/2020    ECHOCARDIOGRAM REPORT   Patient Name:   [facility] Date of Exam: 12/21/2020 Medical Rec #:  [id number]    Height:       73.0 in Accession #:    [id number]   Weight:       206.3 lb Date of Birth:  [birth year]    BSA:          2.180 m Patient Age:    63 years     BP:           126/75 mmHg Patient Gender: M             HR:           62 bpm. Exam Location:  [facility] Procedure: 2D Echo, 3D Echo and Strain Analysis Indications:     AMI I21.9  History:         Patient has no prior history of Echocardiogram examinations.  Sonographer:     Kathlen Brunswick RDCS Referring Phys:  [place] Diagnosing Phys: Kate Sable MD  Sonographer Comments: Global longitudinal strain was attempted. IMPRESSIONS  1. Left ventricular ejection fraction, by estimation, is 50%. The left ventricle has low normal function. The left ventricle demonstrates regional wall motion abnormalities (see scoring diagram/findings for description). There is mild left ventricular hypertrophy. Left ventricular diastolic parameters are consistent with Grade II diastolic dysfunction (pseudonormalization). There is moderate hypokinesis of the left ventricular, entire inferolateral wall.  2. Right ventricular systolic function is normal. The right ventricular size is normal.  3. The mitral valve is normal in structure. No evidence of mitral valve regurgitation.  4. The aortic valve is tricuspid. Aortic valve regurgitation is not visualized. FINDINGS  Left Ventricle: Left ventricular ejection fraction, by estimation, is 50%. The left ventricle has low normal function. The left ventricle demonstrates regional wall motion abnormalities. Moderate hypokinesis of the left ventricular, entire inferolateral  wall. Global longitudinal strain performed but not reported based on interpreter judgement due to suboptimal tracking. 3D left ventricular ejection fraction analysis performed but not reported based on interpreter judgement due to suboptimal quality. The left ventricular internal cavity size was normal in size. There is mild left ventricular hypertrophy. Left ventricular diastolic parameters are consistent with Grade II diastolic dysfunction (pseudonormalization). Right Ventricle: The right ventricular size is normal. No increase in right ventricular wall  thickness. Right ventricular systolic function is normal. Left Atrium: Left atrial size was normal in size. Right Atrium: Right atrial size was normal in size. Pericardium: There is no evidence of pericardial effusion. Mitral Valve: The mitral valve is normal in structure. No evidence of mitral valve regurgitation. Tricuspid Valve: The tricuspid valve is normal in structure. Tricuspid valve regurgitation is not demonstrated. Aortic Valve: The aortic valve is tricuspid. Aortic valve regurgitation is not visualized. Aortic valve peak gradient measures 5.7 mmHg. Pulmonic Valve: The pulmonic valve was not well visualized. Pulmonic valve regurgitation is not visualized. Aorta: The aortic root is normal in size and structure. Venous: The inferior vena cava was not well visualized. IAS/Shunts: No atrial level shunt detected by color flow Doppler.  LEFT VENTRICLE PLAX 2D LVIDd:         4.90 cm      Diastology LVIDs:         3.40 cm      LV e' medial:    5.66 cm/s LV PW:         1.10 cm      LV E/e' medial:  12.9 LV IVS:        1.30 cm      LV e' lateral:   4.35 cm/s LVOT diam:     2.00 cm      LV E/e' lateral: 16.7 LV SV:         40 LV SV Index:   18 LVOT Area:     3.14 cm                              3D Volume EF: LV Volumes (MOD)            3D EF:        55 % LV vol d, MOD A2C: 139.0 ml LV EDV:       246 ml LV vol s, MOD A2C: 75.4 ml  LV ESV:       110 ml LV SV MOD A2C:     63.6 ml  LV SV:        135 ml RIGHT VENTRICLE RV Basal  diam:  2.80 cm RV S prime:     13.10 cm/s TAPSE (M-mode): 1.9 cm LEFT ATRIUM             Index       RIGHT ATRIUM           Index LA diam:        2.90 cm 1.33 cm/m  RA Area:     10.90 cm LA Vol (A2C):   46.7 ml 21.42 ml/m RA Volume:   26.00 ml  11.92 ml/m LA Vol (A4C):   37.2 ml 17.06 ml/m LA Biplane Vol: 41.9 ml 19.22 ml/m  AORTIC VALVE AV Area (Vmax): 1.93 cm AV Vmax:        119.00 cm/s AV Peak Grad:   5.7 mmHg LVOT Vmax:      73.10 cm/s LVOT Vmean:     43.500 cm/s LVOT VTI:       0.126 m   AORTA Ao Root diam: 3.30 cm MITRAL VALVE MV Area (PHT): 2.60 cm    SHUNTS MV Decel Time: 292 msec    Systemic VTI:  0.13 m MV E velocity: 72.80 cm/s  Systemic Diam: 2.00 cm MV A velocity: 53.10 cm/s MV E/A ratio:  1.37 Kate Sable MD Electronically signed by Kate Sable MD Signature Date/Time: 12/21/2020/10:34:11 AM    Final      Scheduled Meds:  amiodarone  150 mg Intravenous Once   aspirin  81 mg Oral Daily   atorvastatin  80 mg Oral Daily   carvedilol  3.125 mg Oral BID WC   Chlorhexidine Gluconate Cloth  6 each Topical Q0600   enoxaparin (LOVENOX) injection  40 mg Subcutaneous Q24H   losartan  25 mg Oral Daily   sodium chloride flush  3 mL Intravenous Q12H   ticagrelor  90 mg Oral BID   Continuous Infusions:  sodium chloride 1,000 mL (12/21/20 0946)   amiodarone 30 mg/hr (12/21/20 1621)     LOS: 1 day     Enzo Bi, MD [facility] If 7PM-7AM, please contact night-coverage 12/21/2020, 5:35 PM

## 2020-12-22 ENCOUNTER — Inpatient Hospital Stay: Payer: Self-pay

## 2020-12-22 ENCOUNTER — Encounter: Payer: Self-pay | Admitting: Family Medicine

## 2020-12-22 ENCOUNTER — Inpatient Hospital Stay (HOSPITAL_COMMUNITY)
Admit: 2020-12-22 | Discharge: 2020-12-22 | Disposition: A | Discharge status: Home / Self Care | Payer: Self-pay | Attending: Cardiology | Admitting: Cardiology

## 2020-12-22 DIAGNOSIS — I4581 Long QT syndrome: Secondary | ICD-10-CM

## 2020-12-22 DIAGNOSIS — I472 Ventricular tachycardia: Secondary | ICD-10-CM

## 2020-12-22 DIAGNOSIS — I469 Cardiac arrest, cause unspecified: Secondary | ICD-10-CM

## 2020-12-22 LAB — BLOOD GAS, ARTERIAL
Acid-Base Excess: 2.4 mmol/L — ABNORMAL HIGH (ref 0.0–2.0)
Bicarbonate: 27.9 mmol/L (ref 20.0–28.0)
FIO2: 100
MECHVT: 500 mL
O2 Saturation: 100 %
PEEP: 5 cmH2O
Patient temperature: 37
RATE: 14 resp/min
pCO2 arterial: 45 mmHg (ref 32.0–48.0)
pH, Arterial: 7.4 (ref 7.350–7.450)
pO2, Arterial: 388 mmHg — ABNORMAL HIGH (ref 83.0–108.0)

## 2020-12-22 LAB — CBC
HCT: 43.5 % (ref 39.0–52.0)
Hemoglobin: 15.4 g/dL (ref 13.0–17.0)
MCH: 33.1 pg (ref 26.0–34.0)
MCHC: 35.4 g/dL (ref 30.0–36.0)
MCV: 93.5 fL (ref 80.0–100.0)
Platelets: 245 10*3/uL (ref 150–400)
RBC: 4.65 MIL/uL (ref 4.22–5.81)
RDW: 13.6 % (ref 11.5–15.5)
WBC: 13.7 10*3/uL — ABNORMAL HIGH (ref 4.0–10.5)
nRBC: 0 % (ref 0.0–0.2)

## 2020-12-22 LAB — BASIC METABOLIC PANEL
Anion gap: 9 (ref 5–15)
BUN: 28 mg/dL — ABNORMAL HIGH (ref 8–23)
CO2: 26 mmol/L (ref 22–32)
Calcium: 10.8 mg/dL — ABNORMAL HIGH (ref 8.9–10.3)
Chloride: 101 mmol/L (ref 98–111)
Creatinine, Ser: 1.37 mg/dL — ABNORMAL HIGH (ref 0.61–1.24)
GFR, Estimated: 58 mL/min — ABNORMAL LOW (ref >60)
Glucose, Bld: 140 mg/dL — ABNORMAL HIGH (ref 70–99)
Potassium: 3.5 mmol/L (ref 3.5–5.1)
Sodium: 136 mmol/L (ref 135–145)

## 2020-12-22 LAB — COMPREHENSIVE METABOLIC PANEL
ALT: 52 U/L — ABNORMAL HIGH (ref 0–44)
AST: 139 U/L — ABNORMAL HIGH (ref 15–41)
Albumin: 3.5 g/dL (ref 3.5–5.0)
Alkaline Phosphatase: 43 U/L (ref 38–126)
Anion gap: 6 (ref 5–15)
BUN: 28 mg/dL — ABNORMAL HIGH (ref 8–23)
CO2: 25 mmol/L (ref 22–32)
Calcium: 8.8 mg/dL — ABNORMAL LOW (ref 8.9–10.3)
Chloride: 105 mmol/L (ref 98–111)
Creatinine, Ser: 1.3 mg/dL — ABNORMAL HIGH (ref 0.61–1.24)
GFR, Estimated: >60 mL/min (ref >60)
Glucose, Bld: 112 mg/dL — ABNORMAL HIGH (ref 70–99)
Potassium: 3.9 mmol/L (ref 3.5–5.1)
Sodium: 136 mmol/L (ref 135–145)
Total Bilirubin: 0.9 mg/dL (ref 0.3–1.2)
Total Protein: 6.9 g/dL (ref 6.5–8.1)

## 2020-12-22 LAB — ECHOCARDIOGRAM LIMITED
Height: 72.992 in
S' Lateral: 3.5 cm
Single Plane A4C EF: 53.5 %
Weight: 3301.61 oz

## 2020-12-22 LAB — MAGNESIUM: Magnesium: 2.2 mg/dL (ref 1.7–2.4)

## 2020-12-22 MED ORDER — DOCUSATE SODIUM 50 MG/5ML PO LIQD
100.0000 mg | Freq: Two times a day (BID) | ORAL | Status: DC
  Filled 2020-12-22 (×2): qty 10

## 2020-12-22 MED ORDER — FENTANYL CITRATE (PF) 100 MCG/2ML IJ SOLN
INTRAMUSCULAR | Status: AC
  Administered 2020-12-22: 100 ug
  Filled 2020-12-22: qty 2

## 2020-12-22 MED ORDER — MIDAZOLAM-SODIUM CHLORIDE 100-0.9 MG/100ML-% IV SOLN
0.5000 mg/h | INTRAVENOUS | Status: DC
  Administered 2020-12-22: 8 mg/h via INTRAVENOUS
  Filled 2020-12-22: qty 100

## 2020-12-22 MED ORDER — FENTANYL CITRATE (PF) 100 MCG/2ML IJ SOLN: 50.0000 ug | Freq: Once | INTRAMUSCULAR | Status: AC

## 2020-12-22 MED ORDER — SUCCINYLCHOLINE CHLORIDE 200 MG/10ML IV SOSY
PREFILLED_SYRINGE | INTRAVENOUS | Status: AC
  Filled 2020-12-22: qty 10

## 2020-12-22 MED ORDER — LACTATED RINGERS IV BOLUS
500.0000 mL | Freq: Once | INTRAVENOUS | Status: AC
  Administered 2020-12-22: 500 mL via INTRAVENOUS

## 2020-12-22 MED ORDER — POLYETHYLENE GLYCOL 3350 17 G PO PACK
17.0000 g | PACK | Freq: Every day | ORAL | Status: DC
  Administered 2020-12-26: 17 g
  Filled 2020-12-22 (×4): qty 1

## 2020-12-22 MED ORDER — DOCUSATE SODIUM 50 MG/5ML PO LIQD
100.0000 mg | Freq: Two times a day (BID) | ORAL | Status: DC
  Administered 2020-12-24 – 2020-12-26 (×5): 100 mg via ORAL
  Filled 2020-12-22 (×6): qty 10

## 2020-12-22 MED ORDER — PROPOFOL 1000 MG/100ML IV EMUL
INTRAVENOUS | Status: AC
  Filled 2020-12-22: qty 100

## 2020-12-22 MED ORDER — NOREPINEPHRINE 4 MG/250ML-% IV SOLN
0.0000 ug/min | INTRAVENOUS | Status: DC
Start: 2020-12-22 — End: 2020-12-23

## 2020-12-22 MED ORDER — METOPROLOL TARTRATE 25 MG PO TABS
25.0000 mg | ORAL_TABLET | Freq: Two times a day (BID) | ORAL | Status: DC
  Administered 2020-12-22 – 2020-12-26 (×9): 25 mg via ORAL
  Filled 2020-12-22 (×9): qty 1

## 2020-12-22 MED ORDER — MIDAZOLAM HCL 2 MG/2ML IJ SOLN: 2.0000 mg | INTRAMUSCULAR | Status: DC | PRN

## 2020-12-22 MED ORDER — POTASSIUM CHLORIDE 10 MEQ/100ML IV SOLN
10.0000 meq | INTRAVENOUS | Status: AC
Start: 2020-12-22 — End: 2020-12-22
  Administered 2020-12-22 (×3): 10 meq via INTRAVENOUS
  Filled 2020-12-22 (×3): qty 100

## 2020-12-22 MED ORDER — NOREPINEPHRINE 4 MG/250ML-% IV SOLN
INTRAVENOUS | Status: AC
  Administered 2020-12-22: 4 ug/min via INTRAVENOUS
  Filled 2020-12-22: qty 250

## 2020-12-22 MED ORDER — MAGNESIUM SULFATE 2 GM/50ML IV SOLN
2.0000 g | Freq: Once | INTRAVENOUS | Status: AC
  Administered 2020-12-22: 2 g via INTRAVENOUS
  Filled 2020-12-22: qty 50

## 2020-12-22 MED ORDER — ROCURONIUM BROMIDE 10 MG/ML (PF) SYRINGE
PREFILLED_SYRINGE | INTRAVENOUS | Status: AC
  Administered 2020-12-22: 50 mg
  Filled 2020-12-22: qty 10

## 2020-12-22 MED ORDER — METOPROLOL TARTRATE 5 MG/5ML IV SOLN
5.0000 mg | Freq: Once | INTRAVENOUS | Status: AC
  Administered 2020-12-22: 5 mg via INTRAVENOUS

## 2020-12-22 MED ORDER — MIDAZOLAM HCL 2 MG/2ML IJ SOLN
INTRAMUSCULAR | Status: AC
  Administered 2020-12-22: 2 mg via INTRAVENOUS
  Filled 2020-12-22: qty 2

## 2020-12-22 MED ORDER — MIDAZOLAM HCL 2 MG/2ML IJ SOLN
INTRAMUSCULAR | Status: AC
  Administered 2020-12-22: 2 mg
  Filled 2020-12-22: qty 2

## 2020-12-22 MED ORDER — FENTANYL CITRATE (PF) 100 MCG/2ML IJ SOLN
INTRAMUSCULAR | Status: AC
  Administered 2020-12-22: 50 ug via INTRAVENOUS
  Filled 2020-12-22: qty 2

## 2020-12-22 MED ORDER — FENTANYL 2500MCG IN NS 250ML (10MCG/ML) PREMIX INFUSION
50.0000 ug/h | INTRAVENOUS | Status: DC
  Administered 2020-12-22: 75 ug/h via INTRAVENOUS
  Filled 2020-12-22: qty 250

## 2020-12-22 MED ORDER — KETAMINE HCL 50 MG/5ML IJ SOSY
PREFILLED_SYRINGE | INTRAMUSCULAR | Status: AC
  Filled 2020-12-22: qty 5

## 2020-12-22 MED ORDER — ETOMIDATE 2 MG/ML IV SOLN
INTRAVENOUS | Status: AC
  Administered 2020-12-22: 10 mg
  Filled 2020-12-22: qty 10

## 2020-12-22 NOTE — Progress Notes (Signed)
Pt extubated per MD order. Pt placed on 3Lnc

## 2020-12-22 NOTE — Progress Notes (Signed)
I was notified frequent PVCs starting shortly after midnight with short runs of nonsustained ventricular tachycardia.  I ordered basic metabolic profile and magnesium.  Throughout all of this, the patient had no symptoms and did not complain of any chest pain or shortness of breath. He then went into polymorphic ventricular tachycardia that degenerated into ventricular fibrillation which required multiple shocks.  The patient was intubated for airway protection.  I came to the bedside and evaluated the patient.  A twelve-lead EKG was performed which showed no significant ST elevation but QT interval was significantly prolonged greater than 550 ms.  This was consistent with Torsades de Pointes.  Based on this, I discontinued amiodarone.  The patient was already given 2 g of magnesium.  He then went into SVT and thus I gave him 5 mg of IV metoprolol.  He converted sinus rhythm.  On the monitor, the patient is noted to have gradual shortening of QT interval.  Recommendations: Polymorphic ventricular tachycardia/ventricular fibrillation: Status post successful defibrillation he is now in sinus rhythm.  This is in the setting of severely elevated QT interval. Amiodarone was discontinued. I agree with magnesium.  Keep magnesium above 2. Potassium was also low at 3.5.  I ordered replacement.  Keep potassium above 4. Avoid any medication that can cause QT prolongation. The patient has not been hypotensive but if he develops hypotension due to sedation, small dose norepinephrine drip can be considered. I switched carvedilol to metoprolol.  This event does not seem to be mediated by stent thrombosis as the patient had no complaints of chest pain or other anginal symptoms around the time that he was having frequent nonsustained ventricular tachycardia.  In addition, his EKG does not show any ST elevation.  Thus, no need for relook angiography at the present time.  I called the patient's fianc Henrene Hawking) to  update her but could not reach her as the call went straight to voicemail.  Total critical time of 45 minutes

## 2020-12-22 NOTE — Consult Note (Addendum)
NAME:  Darryl Gilbert, MRN:  [id number], DOB:  1955-07-18, LOS: 2 ADMISSION DATE:  12/20/2020, CONSULTATION DATE:  12/22/2020 REFERRING MD: Noberto Retort MD CHIEF COMPLAINT:  Chest Pain    HPI  65 y.o male with significant PMH of hypertension tobacco use and chronic back pain who presented to the ED with chief complaints of chest pain.  Patient unable to provide history so history mostly obtained from patient's chart.  Per his chart, patient complained of substernal chest pain radiating to his neck and left arm.  He apparently described pain as heavy sensation in his chest rating 10 out of a 10 on a pain intensity rating scale associated with dizziness and shortness of breath EMS was called and patient was found to have significant anterior ST elevation on his EKG hence code STEMI was activated.  He received aspirin and 100 mics of fentanyl but no nitroglycerin due to hypotension.  Patient was transported to the ED for further evaluation.  ED Course: On arrival to the ED, he was afebrile with blood pressure 146/116 mm Hg and pulse rate 76 beats/min, respiration 23, oxygen saturation 100% on room air. There were no focal neurological deficits; he was alert and oriented x4.  Initial twelve-lead EKG showed inferior STEMI, sinus rhythm with rates in the 130s.  Patient received IV metoprolol and cardiology consulted in the ED who recommended heparinization and left heart cath emergently.  Patient was taken to Cath Lab emergently.  Patient had PCI to left circumflex.  70% RCA also noted. Echo showed low normal EF 50%.  Inferolateral wall hypokinesis.  In the OR patient went into into brief episode of A. fib after intervention, amiodarone drip was started, patient has maintained sinus rhythm since.  Patient was transferred to the ICU post cath. Initial pertinent labs/Diagnostics WBC/Hgb/Hct/Plts:  13.7/15.4/43.5/245 (09/05 0222)  Glucose 139, BUN 22, creatinine 1.63 Troponin 19>4521>>24,000>>24,000 EKG: Inferior ST  elevation MI  Hospital Course: During the course of his recovering in the ICU, patient went into polymorphic ventricular tachycardia that degenerated quickly into ventricular fibrillation.  CODE BLUE was initiated and patient required 3 rounds of defibrillation following administration of 2 mg of Versed and 50 of fentanyl.  Twelve-lead EKG was obtained and showed no significant ST elevation but was noted with prolonged QT interval greater than 550 ms consistent with torsades de points.  Cardiology was started 5 who came to the bedside and recommended discontinuation of amiodarone drip.  Patient was ready received sodium bicarb calcium gluconate and 2 mg of IV magnesium.  Patient then went into SVT and received 5 mg of IV metoprolol per cardiology with conversion to sinus rhythm and gradual shortening of QT interval.  PCCM consulted for further management.  Past Medical History  Hypertension Back pain  Significant Hospital Events   9/3: Patient presenting with Inferior lateral STEMI  status post DES placement to the proximal left circumflex artery 9/4: Patient went into polymorphic ventricular tachycardia s/p defibrillation.  Intubated.  PCCM consulted  Consults:  Cardiology PCCM  Procedures:  9/3: Cardiac cath 9/4: Left IJ placement 9/4: Left femoral art line 9/4: Mechanical intubation  Significant Diagnostic Tests:  9/3: Chest Xray> no active cardiopulmonary process  Micro Data:  9/3: SARS-CoV-2 PCR> negative 9/3: Influenza PCR> negative  Antimicrobials:  None required   OBJECTIVE  Blood pressure (!) 75/51, pulse (!) 51, temperature 98.1 F (36.7 C), temperature source Oral, resp. rate 17, height 6' 0.99" (1.854 m), weight 93.6 kg, SpO2 100 %.  Vent Mode: PRVC FiO2 (%):  [100 %] 100 % Set Rate:  [14 bmp] 14 bmp Vt Set:  [500 mL] 500 mL PEEP:  [5 cmH20] 5 cmH20   Intake/Output Summary (Last 24 hours) at 12/22/2020 0433 Last data filed at 12/22/2020 0343 Gross per 24 hour   Intake 2135.69 ml  Output 3900 ml  Net -1764.31 ml   Filed Weights   12/20/20 1447 12/20/20 1641  Weight: 96.2 kg 93.6 kg   Physical Examination  GENERAL: 65 year-old critically ill patient lying in the bed with intubated, mechanically ventilated and sedated EYES: Pupils equal, round, reactive to light and accommodation. No scleral icterus. Extraocular muscles intact.  HEENT: Head atraumatic, normocephalic. Oropharynx and nasopharynx clear.  NECK:  Supple, no jugular venous distention. No thyroid enlargement, no tenderness.  LUNGS: Normal breath sounds bilaterally, no wheezing, rales,rhonchi or crepitation. No use of accessory muscles of respiration.  CARDIOVASCULAR: S1, S2 normal. No murmurs, rubs, or gallops.  ABDOMEN: Soft, nontender, nondistended. Bowel sounds present. No organomegaly or mass.  EXTREMITIES: No pedal edema, cyanosis, or clubbing.  NEUROLOGIC: Cranial nerves II through XII are intact.  Muscle strength not assessed.  Sensation intact. Gait not checked.  PSYCHIATRIC: The patient is intubated and sedated SKIN: No obvious rash, lesion, or ulcer.   Labs/imaging that I havepersonally reviewed  (right click and "Reselect all SmartList Selections" daily)     Labs   CBC: Recent Labs  Lab 12/20/20 1448 12/21/20 0543 12/22/20 0222  WBC 9.9 10.6* 13.7*  NEUTROABS 6.1  --   --   HGB 15.2 13.8 15.4  HCT 42.9 39.9 43.5  MCV 91.5 91.3 93.5  PLT 250 185 [id number]    Basic Metabolic Panel: Recent Labs  Lab 12/20/20 1448 12/21/20 0543 12/22/20 0222  NA 135 135 136  K 3.9 3.9 3.5  CL 102 104 101  CO2 '23 23 26  '$ GLUCOSE 139* 102* 140*  BUN 22 26* 28*  CREATININE 1.63* 1.33* 1.37*  CALCIUM 9.6 9.1 10.8*  MG 1.7  --  2.2   GFR: Estimated Creatinine Clearance: 61.6 mL/min (A) (by C-G formula based on SCr of 1.37 mg/dL (H)). Recent Labs  Lab 12/20/20 1448 12/21/20 0543 12/22/20 0222  WBC 9.9 10.6* 13.7*    Liver Function Tests: Recent Labs  Lab 12/20/20 1448  12/21/20 0543  AST 30 318*  ALT 22 69*  ALKPHOS 49 40  BILITOT 1.0 0.6  PROT 7.5 6.8  ALBUMIN 4.1 3.7   No results for input(s): LIPASE, AMYLASE in the last 168 hours. No results for input(s): AMMONIA in the last 168 hours.  ABG No results found for: PHART, PCO2ART, PO2ART, HCO3, TCO2, ACIDBASEDEF, O2SAT   Coagulation Profile: Recent Labs  Lab 12/20/20 1448  INR 1.0    Cardiac Enzymes: No results for input(s): CKTOTAL, CKMB, CKMBINDEX, TROPONINI in the last 168 hours.  HbA1C: No results found for: HGBA1C  CBG: Recent Labs  Lab 12/20/20 1642  Kerr 115*    Review of Systems:   UNABLE TO OBTAIN PATIENT IS INTUBATED AD SEDATED  Past Medical History  He,  has a past medical history of Back pain and Hypertension.   Surgical History   History reviewed. No pertinent surgical history.   Social History   reports that he has been smoking. He has never used smokeless tobacco.   Family History   His family history is not on file.   Allergies No Known Allergies   Home Medications  Prior to Admission medications  Medication Sig Start Date End Date Taking? Authorizing Provider  cyclobenzaprine (FLEXERIL) 10 MG tablet Take 1 tablet (10 mg total) by mouth 3 (three) times daily as needed. 12/12/17   Sable Feil, PA-C  losartan (COZAAR) 100 MG tablet Take 100 mg by mouth daily.    [provider]  meloxicam (MOBIC) 15 MG tablet Take 1 tablet (15 mg total) by mouth daily. 12/12/17   Sable Feil, PA-C  olmesartan (BENICAR) 20 MG tablet Take 20 mg by mouth daily.    [provider]  oxyCODONE-acetaminophen (PERCOCET) 7.5-325 MG tablet Take 1 tablet by mouth every 6 (six) hours as needed. 12/12/17   Sable Feil, PA-C    Scheduled Meds:  aspirin  81 mg Oral Daily   atorvastatin  80 mg Oral Daily   Chlorhexidine Gluconate Cloth  6 each Topical Q0600   docusate  100 mg Per Tube BID   enoxaparin (LOVENOX) injection  40 mg Subcutaneous Q24H    losartan  25 mg Oral Daily   metoprolol tartrate  25 mg Oral BID   midazolam       polyethylene glycol  17 g Per Tube Daily   sodium chloride flush  3 mL Intravenous Q12H   ticagrelor  90 mg Oral BID   Continuous Infusions:  sodium chloride 1,000 mL (12/21/20 0946)   fentaNYL infusion INTRAVENOUS 75 mcg/hr (12/22/20 0512)   norepinephrine     potassium chloride 10 mEq (12/22/20 0514)   propofol Stopped (12/22/20 0502)   PRN Meds:.sodium chloride, acetaminophen, cyclobenzaprine, sodium chloride flush  Assessment & Plan:  Acute hypoxic respiratory failure in the setting of cardiac dysrhythmia  PMHx:presumed OSA, tobacco abuse -Ventilator settings: PRVC 8 mL/kg, 90 % FiO2, 5 PEEP -Wean PEEP and FiO2 for sats greater than 90% -Plateau pressures less than 30 cm H20 -VAP bundle in place -Intermittent chest x-ray & ABG -WUA/ SBT as appropriate -Ensure adequate pulmonary hygiene  -Nicotine patch ordered -Budesonide nebs BID, bronchodilators PRN -PAD protocol in place: continue Fentanyl drip & Versed drip&IVP  Polymorphic ventricular tachycardia/ventricular fibrillation in the setting of prolonged QTc S/p Defibrillation (100-200J biphasic) under sedation  -Serial EKG -Replace electrolytes as needed -Monitor BMP+Ma -Avoid QTC prolongation agents -Metoprolol PRN -Keep NPO  -STOP Amiodarone    Inferior Lateral STEMI status post DES placement to the proximal left circumflex artery, 70% RCA also noted Echo showed low normal EF 50%.  Inferolateral wall hypokinesis -ASA '81mg'$  PO daily +Brilinta -Atorvastatin '80mg'$  PO daily, check lipid panel  -Continue metoprolol (Hold if SBP<90 / cardiogenic shock)  -Cardiology following  Acute systolic heart failure/post MI Cardiomyopathy -Hypertension -Post MI A-fib with RVR -Continuous cardiac monitoring -Levophed to maintain MAP greater than 65 -Continue metoprolol as above -Losartan as BP and renal function permits -TSH, FT4 -Stop  amiodarone as above -Cardiology following, appreciate input   Best practice:  Diet:  Tube Feed  Pain/Anxiety/Delirium protocol (if indicated): Yes (RASS goal -1) VAP protocol (if indicated): Yes DVT prophylaxis: LMWH GI prophylaxis: N/A Glucose control:  SSI No Central venous access:  Yes, and it is still needed Arterial line:  Yes, and it is still needed Foley:  Yes, and it is still needed Mobility:  bed rest  PT consulted: N/A Last date of multidisciplinary goals of care discussion [9/5] Code Status:  full code Disposition: ICU   = Goals of Care = Code Status Order: FULL  Primary Emergency Contact: DANIELS,GAIL, Home Phone: [phone] Wishes to pursue full aggressive treatment and intervention options,  including CPR and intubation, goals of care will be addressed on going with family if that should become necessary.   Critical care time: 45 minutes     Rufina Falco, DNP, CCRN, FNP-C, AGACNP-BC Acute Care Nurse Practitioner  [facility] Pager: [phone] [place] at [facility]  .

## 2020-12-22 NOTE — Progress Notes (Signed)
Chaplain called instead of security to provide support to wife who was quite emotional. Stayed present with her until she seemed to present calmly. Later visited with patient who seemed a bit confused but spoke to me briefly. Will continue to follow up.

## 2020-12-22 NOTE — Procedures (Signed)
Central Venous Catheter Insertion Procedure Note  Darryl Gilbert  [id number]  1955/11/19  Date:12/22/20  Time:4:31 AM   Provider Performing:Deavon Podgorski A Jameire Kouba   Procedure: Insertion of Non-tunneled Central Venous Catheter([id number]) with US guidance [id number])   Indication(s) Medication administration and Difficult access  Consent Unable to obtain consent due to emergent nature of procedure.  Anesthesia Topical only with 1% lidocaine   Timeout Verified patient identification, verified procedure, site/side was marked, verified correct patient position, special equipment/implants available, medications/allergies/relevant history reviewed, required imaging and test results available.  Sterile Technique Maximal sterile technique including full sterile barrier drape, hand hygiene, sterile gown, sterile gloves, mask, hair covering, sterile ultrasound probe cover (if used).  Procedure Description Area of catheter insertion was cleaned with chlorhexidine and draped in sterile fashion.  With real-time ultrasound guidance a central venous catheter was placed into the left internal jugular vein. Nonpulsatile blood flow and easy flushing noted in all ports.  The catheter was sutured in place and sterile dressing applied.  Complications/Tolerance None; patient tolerated the procedure well. Chest X-ray is ordered to verify placement for internal jugular or subclavian cannulation.   Chest x-ray is not ordered for femoral cannulation.  EBL Minimal  Specimen(s) None  Rufina Falco, DNP, CCRN, FNP-C, AGACNP-BC Acute Care Nurse Practitioner  [facility] Pager: [phone] [place] at [facility]

## 2020-12-22 NOTE — Procedures (Signed)
Arterial Catheter Insertion Procedure Note  Darryl Gilbert  [id number]  03-28-1956  Date:12/22/20  Time:4:32 AM    Provider Performing: Karen Kays    Procedure: Insertion of Arterial Line [phone]) with US guidance [id number])   Indication(s) Blood pressure monitoring and/or need for frequent ABGs  Consent Unable to obtain consent due to emergent nature of procedure.  Anesthesia None   Time Out Verified patient identification, verified procedure, site/side was marked, verified correct patient position, special equipment/implants available, medications/allergies/relevant history reviewed, required imaging and test results available.   Sterile Technique Maximal sterile technique including full sterile barrier drape, hand hygiene, sterile gown, sterile gloves, mask, hair covering, sterile ultrasound probe cover (if used).   Procedure Description Area of catheter insertion was cleaned with chlorhexidine and draped in sterile fashion. With real-time ultrasound guidance an arterial catheter was placed into the left femoral artery.  Appropriate arterial tracings confirmed on monitor.     Complications/Tolerance None; patient tolerated the procedure well.   EBL Minimal   Specimen(s) None  Rufina Falco, DNP, CCRN, FNP-C, AGACNP-BC Acute Care Nurse Practitioner  [place] [facility] Pager: [phone] [place] at [facility]

## 2020-12-22 NOTE — Progress Notes (Signed)
*  PRELIMINARY RESULTS* Echocardiogram 2D Echocardiogram has been performed.  Claretta Fraise 12/22/2020, 12:55 PM

## 2020-12-22 NOTE — Progress Notes (Addendum)
Progress Note  Patient Name: Darryl Gilbert Date of Encounter: 12/22/2020  Primary Cardiologist: New, Dr. [name]  Subjective  Intubated and sedated  Frequent PVCs, NSVT throughout 12/21/2020  Overnight 2 AM Vtach/Vfib on 12/22/2020 Once in ventricular fibrillation, he required multiple shocks He was then intubated and sedated  EKG without ST elevation which showed prolonged QT, greater than 550 ms Rhythm consistent with torsades de pointes  IV amiodarone discontinued He received 2 g magnesium Rhythm then transitioned to SVT IV metoprolol initiated with rhythm converting to NSR QT interval shortened on monitor  IV electrolyte repletion initiated Patient remains intubated  Inpatient Medications    Scheduled Meds:  aspirin  81 mg Oral Daily   atorvastatin  80 mg Oral Daily   Chlorhexidine Gluconate Cloth  6 each Topical Q0600   docusate  100 mg Per Tube BID   enoxaparin (LOVENOX) injection  40 mg Subcutaneous Q24H   losartan  25 mg Oral Daily   metoprolol tartrate  25 mg Oral BID   polyethylene glycol  17 g Per Tube Daily   sodium chloride flush  3 mL Intravenous Q12H   ticagrelor  90 mg Oral BID   Continuous Infusions:  sodium chloride 10 mL/hr at 12/22/20 0614   fentaNYL infusion INTRAVENOUS 75 mcg/hr (12/22/20 0700)   midazolam 10 mg/hr (12/22/20 0700)   norepinephrine (LEVOPHED) Adult infusion 8 mcg/min (12/22/20 0700)   propofol Stopped (12/22/20 0502)   PRN Meds: sodium chloride, acetaminophen, cyclobenzaprine, midazolam, sodium chloride flush   Vital Signs    Vitals:   12/22/20 0515 12/22/20 0600 12/22/20 0700 12/22/20 0730  BP: 124/79 121/68 109/66   Pulse: (!) 53 (!) 51 (!) 49   Resp: '15 14 14   '$ Temp:      TempSrc:      SpO2: 99% 100% 98% 98%  Weight:      Height:        Intake/Output Summary (Last 24 hours) at 12/22/2020 0806 Last data filed at 12/22/2020 0700 Gross per 24 hour  Intake 2517.23 ml  Output 3250 ml  Net -732.77 ml    Last 3 Weights  12/20/2020 12/20/2020  Weight (lbs) 206 lb 5.6 oz 212 lb  Weight (kg) 93.6 kg 96.163 kg      Telemetry    Current progress note telemetry: SB, 50s, frequent PACs versus WAP Overnight, Vtach/Vfib with strips printed  Previous Progress note telemetry: NSR, 60-70s, PVCs, NSVT (frequent episodes NSVT about 12 beats), earlier A. fib- --Personally Reviewed  ECG    No new tracings, EKG recommended- Personally Reviewed  Physical Exam   GEN: Intubated, sedated. Neck: JVD difficult to assess given patient intubated Cardiac: SB with frequent extrasystole, no murmurs, rubs, or gallops.   Respiratory: Intubated on mechanical ventilation.  Right base crackles improved. GI: slightly distended - improved from yesterday MS: No deformity. Neuro:  Intubated  Psych: Intubated  Labs    High Sensitivity Troponin:   Recent Labs  Lab 12/20/20 1448 12/20/20 1730 12/20/20 1939 12/20/20 2128  TROPONINIHS 19* 4,521* >24,000* >24,000*       Chemistry Recent Labs  Lab 12/20/20 1448 12/21/20 0543 12/22/20 0222  NA 135 135 136  K 3.9 3.9 3.5  CL 102 104 101  CO2 '23 23 26  '$ GLUCOSE 139* 102* 140*  BUN 22 26* 28*  CREATININE 1.63* 1.33* 1.37*  CALCIUM 9.6 9.1 10.8*  PROT 7.5 6.8  --   ALBUMIN 4.1 3.7  --   AST 30 318*  --  ALT 22 69*  --   ALKPHOS 49 40  --   BILITOT 1.0 0.6  --   GFRNONAA 47* 60* 58*  ANIONGAP '10 8 9      '$ Hematology Recent Labs  Lab 12/20/20 1448 12/21/20 0543 12/22/20 0222  WBC 9.9 10.6* 13.7*  RBC 4.69 4.37 4.65  HGB 15.2 13.8 15.4  HCT 42.9 39.9 43.5  MCV 91.5 91.3 93.5  MCH 32.4 31.6 33.1  MCHC 35.4 34.6 35.4  RDW 13.3 13.6 13.6  PLT 250 185 245     BNPNo results for input(s): BNP, PROBNP in the last 168 hours.   DDimer No results for input(s): DDIMER in the last 168 hours.   Radiology    DG Chest 1 View  Result Date: 12/22/2020 CLINICAL DATA:  Encounter for central line placement EXAM: CHEST  1 VIEW COMPARISON:  Earlier today FINDINGS: New  left IJ line with tip near the SVC origin. No pneumothorax or new mediastinal widening. Enteric and endotracheal tubes in good position. Stable pulmonary inflation and interstitial coarsening. Normal heart size. IMPRESSION: New central line and enteric tube without complicating feature. Stable aeration. Electronically Signed   By: Monte Fantasia M.D.   On: 12/22/2020 05:04   DG Chest 1 View  Result Date: 12/22/2020 CLINICAL DATA:  OG tube placement, post code EXAM: CHEST  1 VIEW COMPARISON:  None. FINDINGS: Endotracheal tube has been placed with the tip 2 cm above the carina. No OG tube is visualized. Heart is normal size. No confluent airspace opacity, effusion or pneumothorax. No acute bony abnormality. IMPRESSION: Endotracheal tube 2 cm above the carina.  No visible OG tube. No acute cardiopulmonary disease. Electronically Signed   By: Rolm Baptise M.D.   On: 12/22/2020 02:47   CARDIAC CATHETERIZATION  Result Date: 12/20/2020   Ost RCA to Prox RCA lesion is 70% stenosed.   Prox Cx to Mid Cx lesion is 100% stenosed.   1st Mrg lesion is 60% stenosed.   A drug-eluting stent was successfully placed using a STENT ONYX FRONTIER 3.0X22.   Post intervention, there is a 0% residual stenosis.   There is moderate left ventricular systolic dysfunction.   LV end diastolic pressure is moderately elevated.   The left ventricular ejection fraction is 35-45% by visual estimate. 1.  Codominant coronary arteries with thrombotic occlusion of the proximal left circumflex which is the culprit for inferior STEMI.  There is also 70% stenosis in ostial right coronary artery which has high anterior takeoff.  Catheter induced spasm in the ostial right coronary artery cannot be completely excluded. 2.  Moderately reduced LV systolic function with segmental wall motion abnormalities in the left circumflex distribution.  Moderately elevated left ventricular end-diastolic pressure at 22 mmHg. 3.  Successful angioplasty and drug-eluting  stent placement to the left circumflex. Recommendations: Dual antiplatelet therapy for at least 12 months. Aggressive treatment of risk factors and smoking cessation. The patient had atrial fibrillation on presentation but converted sinus rhythm at the end of the case with amiodarone drip.  Continue amiodarone drip for now and monitor rhythm closely.   DG Chest Port 1 View  Result Date: 12/21/2020 CLINICAL DATA:  Respiratory distress EXAM: PORTABLE CHEST 1 VIEW COMPARISON:  None. FINDINGS: Normal heart size. Normal mediastinal contour. No pneumothorax. No pleural effusion. Lungs appear clear, with no acute consolidative airspace disease and no pulmonary edema. IMPRESSION: No active disease. Electronically Signed   By: Ilona Sorrel M.D.   On: 12/21/2020 07:10   ECHOCARDIOGRAM  COMPLETE  Result Date: 12/21/2020    ECHOCARDIOGRAM REPORT   Patient Name:   ERI SCHU [facility] Date of Exam: 12/21/2020 Medical Rec #:  [id number]    Height:       73.0 in Accession #:    [id number]   Weight:       206.3 lb Date of Birth:  1955-11-22    BSA:          2.180 m Patient Age:    13 years     BP:           126/75 mmHg Patient Gender: M            HR:           62 bpm. Exam Location:  [facility] Procedure: 2D Echo, 3D Echo and Strain Analysis Indications:     AMI I21.9  History:         Patient has no prior history of Echocardiogram examinations.  Sonographer:     Kathlen Brunswick RDCS Referring Phys:  [id number] A ARIDA Diagnosing Phys: Kate Sable MD  Sonographer Comments: Global longitudinal strain was attempted. IMPRESSIONS  1. Left ventricular ejection fraction, by estimation, is 50%. The left ventricle has low normal function. The left ventricle demonstrates regional wall motion abnormalities (see scoring diagram/findings for description). There is mild left ventricular hypertrophy. Left ventricular diastolic parameters are consistent with Grade II diastolic dysfunction (pseudonormalization). There is moderate hypokinesis of the  left ventricular, entire inferolateral wall.  2. Right ventricular systolic function is normal. The right ventricular size is normal.  3. The mitral valve is normal in structure. No evidence of mitral valve regurgitation.  4. The aortic valve is tricuspid. Aortic valve regurgitation is not visualized. FINDINGS  Left Ventricle: Left ventricular ejection fraction, by estimation, is 50%. The left ventricle has low normal function. The left ventricle demonstrates regional wall motion abnormalities. Moderate hypokinesis of the left ventricular, entire inferolateral  wall. Global longitudinal strain performed but not reported based on interpreter judgement due to suboptimal tracking. 3D left ventricular ejection fraction analysis performed but not reported based on interpreter judgement due to suboptimal quality. The left ventricular internal cavity size was normal in size. There is mild left ventricular hypertrophy. Left ventricular diastolic parameters are consistent with Grade II diastolic dysfunction (pseudonormalization). Right Ventricle: The right ventricular size is normal. No increase in right ventricular wall thickness. Right ventricular systolic function is normal. Left Atrium: Left atrial size was normal in size. Right Atrium: Right atrial size was normal in size. Pericardium: There is no evidence of pericardial effusion. Mitral Valve: The mitral valve is normal in structure. No evidence of mitral valve regurgitation. Tricuspid Valve: The tricuspid valve is normal in structure. Tricuspid valve regurgitation is not demonstrated. Aortic Valve: The aortic valve is tricuspid. Aortic valve regurgitation is not visualized. Aortic valve peak gradient measures 5.7 mmHg. Pulmonic Valve: The pulmonic valve was not well visualized. Pulmonic valve regurgitation is not visualized. Aorta: The aortic root is normal in size and structure. Venous: The inferior vena cava was not well visualized. IAS/Shunts: No atrial level shunt  detected by color flow Doppler.  LEFT VENTRICLE PLAX 2D LVIDd:         4.90 cm      Diastology LVIDs:         3.40 cm      LV e' medial:    5.66 cm/s LV PW:         1.10 cm  LV E/e' medial:  12.9 LV IVS:        1.30 cm      LV e' lateral:   4.35 cm/s LVOT diam:     2.00 cm      LV E/e' lateral: 16.7 LV SV:         40 LV SV Index:   18 LVOT Area:     3.14 cm                              3D Volume EF: LV Volumes (MOD)            3D EF:        55 % LV vol d, MOD A2C: 139.0 ml LV EDV:       246 ml LV vol s, MOD A2C: 75.4 ml  LV ESV:       110 ml LV SV MOD A2C:     63.6 ml  LV SV:        135 ml RIGHT VENTRICLE RV Basal diam:  2.80 cm RV S prime:     13.10 cm/s TAPSE (M-mode): 1.9 cm LEFT ATRIUM             Index       RIGHT ATRIUM           Index LA diam:        2.90 cm 1.33 cm/m  RA Area:     10.90 cm LA Vol (A2C):   46.7 ml 21.42 ml/m RA Volume:   26.00 ml  11.92 ml/m LA Vol (A4C):   37.2 ml 17.06 ml/m LA Biplane Vol: 41.9 ml 19.22 ml/m  AORTIC VALVE AV Area (Vmax): 1.93 cm AV Vmax:        119.00 cm/s AV Peak Grad:   5.7 mmHg LVOT Vmax:      73.10 cm/s LVOT Vmean:     43.500 cm/s LVOT VTI:       0.126 m  AORTA Ao Root diam: 3.30 cm MITRAL VALVE MV Area (PHT): 2.60 cm    SHUNTS MV Decel Time: 292 msec    Systemic VTI:  0.13 m MV E velocity: 72.80 cm/s  Systemic Diam: 2.00 cm MV A velocity: 53.10 cm/s MV E/A ratio:  1.37 Kate Sable MD Electronically signed by Kate Sable MD Signature Date/Time: 12/21/2020/10:34:11 AM    Final     Cardiac Studies   Echo 12/21/20  1. Left ventricular ejection fraction, by estimation, is 50%. The left  ventricle has low normal function. The left ventricle demonstrates  regional wall motion abnormalities (see scoring diagram/findings for  description). There is mild left ventricular  hypertrophy. Left ventricular diastolic parameters are consistent with  Grade II diastolic dysfunction (pseudonormalization). There is moderate  hypokinesis of the left  ventricular, entire inferolateral wall.   2. Right ventricular systolic function is normal. The right ventricular  size is normal.   3. The mitral valve is normal in structure. No evidence of mitral valve  regurgitation.   4. The aortic valve is tricuspid. Aortic valve regurgitation is not  visualized.   12/20/20 LHC Coronary Diagrams  Diagnostic Dominance: Co-dominant Intervention      Ost RCA to Prox RCA lesion is 70% stenosed.   Prox Cx to Mid Cx lesion is 100% stenosed.   1st Mrg lesion is 60% stenosed.   A drug-eluting stent was successfully placed using a STENT ONYX FRONTIER 3.0X22.  Post intervention, there is a 0% residual stenosis.   There is moderate left ventricular systolic dysfunction.   LV end diastolic pressure is moderately elevated.   The left ventricular ejection fraction is 35-45% by visual estimate. 1.  Codominant coronary arteries with thrombotic occlusion of the proximal left circumflex which is the culprit for inferior STEMI.  There is also 70% stenosis in ostial right coronary artery which has high anterior takeoff.  Catheter induced spasm in the ostial right coronary artery cannot be completely excluded. 2.  Moderately reduced LV systolic function with segmental wall motion abnormalities in the left circumflex distribution.  Moderately elevated left ventricular end-diastolic pressure at 22 mmHg. 3.  Successful angioplasty and drug-eluting stent placement to the left circumflex. Recommendations: Dual antiplatelet therapy for at least 12 months. Aggressive treatment of risk factors and smoking cessation. The patient had atrial fibrillation on presentation but converted sinus rhythm at the end of the case with amiodarone drip.  Continue amiodarone drip for now and monitor rhythm closely.    Patient Profile     65 y.o. 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 tach V. Fib/torsades de pointes as outlined above.  Assessment & Plan    V. tach/V. fib/torsades de pointes Prolonged QTC, improving Electrolyte abnormalities: Hypokalemia, hypomagnesemia SVT, NSVT --Runs of NSVT on 9/4.  At 2AM on 9/5, pt had VT/VF. He required 3 rounds of defibrillation. Pt intubated, sedated.  12 lead EKG showed prolonged QT greater than 550 ms consistent with torsades de points.  Cardiology consulted with recommendation to discontinue IV amiodarone and replete electrolytes.  Labs showed low potassium, low magnesium.  He received 2 g of magnesium.  With subsequent SVT, he was started on IV metoprolol/K+. Started on pressors. Current vitals show bradycardic with PACs versus WAP and ongoing soft pressures. Maintain electrolytes at goal.  K goal 4.0, Mg goal 2.0.  TSH 1.189.  No plan for relook angiography at this time -as per interventional team documentation not consistent with ISR.  Will obtain limited echo.  Per critical care team, will attempt to wean off the ventilator today if possible.  Inferior ST elevation myocardial infarction --Presented with substernal chest pain radiating to neck and left arm with associated dizziness and SOB.  EMS EKG lateral ST elevation. Inferior ST elevation in the ED. HS Tn >2400.0. Emergent LHC showed codominant cor arteries with thrombotic occlusion to the pLCx and culprit for inferior STEMI.  70%s noted in  RCA with high anterior takeoff with catheter induced spasm not able to be excluded.  LCx underwent PCI/DES (right radial).  Echo EF 50% with moderate hypokinesis of the LV and entire inferior lateral wall.   No plan for relook angiography at this time.   Arrhythmia/overnight events as above not thought due to in-stent restenosis.    Obtain repeat limited echo per MD  DAPT recommended for at least 12 months with ASA, Brilinta s/p stenting Continue IV Lopressor/lopressor '25mg'$  BID as tolerated Continue losartan 25 mg daily as BP/Cr tolerates Continue atorvastatin 80 mg daily  Aggressive treatment of risk factors  Acute systolic heart failure/post MI cardiomyopathy -- EF moderately reduced by cath, wall motion abnormalities in left circumflex distribution.  Echo showed EF 50%, WMA, moderate  hypokinesis of LV/entire inferior lateral wall, mild LVH, G2 DD.  Cath showed LVEDP 22 mmHg.  Initially, diuresis not started s/p LHC with recommendation to monitor volume status closely and start diuresis if indicated based on his respiratory status. Started on gentle IV diuresis 9/4. Slight bump in Cr overnight. Diuresis held this AM. -4L yesterday and net -600cc. Continue to monitor volume status closely at this time with restart of diuresis as BP/Cr tolerate. Could consider holding losartan to allow for ongoing diuresis.  Monitor I/os, daily standing weights Net -624 cc on 9/4.  Total output -3250 cc yesterday. Daily BMET.  Maintain electrolytes at goal Overnight Cr with slight bump 1.33  1.37, BUN 26  28 Closely monitor pressures with medication regimen as above to avoid hypotension /prerenal AKI  Atrial fibrillation with RVR --Noted to be in A. fib with RVR at the time of code STEMI.  Suspected as 2/2 myocardial ischemia.  Currently NSR with NSVT.  Dc'd IV amiodarone given QTc.  A. fib suspected to be transient.  Check TSH. Maintain electrolytes at goal. Continue to monitor for on telemetry.  Tobacco use --Complete cessation advised  HLD --LDL 134. Statin recommended.  For questions or updates, please contact [place] Please consult [email or web address] for contact info under        Signed, Arvil Chaco, PA-C  12/22/2020, 8:06 AM

## 2020-12-22 NOTE — Procedures (Signed)
Intubation Procedure Note  Darryl Gilbert  [id number]  [birth year]  Date:12/22/20  Time:4:30 AM   Provider Performing:[name] Kuhnert [name]    Procedure: Intubation ([id number])  Indication(s) Respiratory Failure  Consent Unable to obtain consent due to emergent nature of procedure.   Anesthesia Etomidate, Versed, Fentanyl, and Rocuronium   Time Out Verified patient identification, verified procedure, site/side was marked, verified correct patient position, special equipment/implants available, medications/allergies/relevant history reviewed, required imaging and test results available.   Sterile Technique Usual hand hygeine, masks, and gloves were used   Procedure Description Patient positioned in bed supine.  Sedation given as noted above.  Patient was intubated with endotracheal tube using Glidescope.  View was Grade 1 full glottis .  Number of attempts was 1.  Colorimetric CO2 detector was consistent with tracheal placement.   Complications/Tolerance None; patient tolerated the procedure well. Chest X-ray is ordered to verify placement.   EBL Minimal   Specimen(s) None  Darryl Falco, DNP, CCRN, FNP-C, AGACNP-BC Acute Care Nurse Practitioner  [facility] Pager: [phone] [place] [facility]

## 2020-12-23 ENCOUNTER — Encounter: Payer: Self-pay | Admitting: Family Medicine

## 2020-12-23 ENCOUNTER — Inpatient Hospital Stay: Payer: Self-pay

## 2020-12-23 DIAGNOSIS — I480 Paroxysmal atrial fibrillation: Secondary | ICD-10-CM

## 2020-12-23 DIAGNOSIS — I5031 Acute diastolic (congestive) heart failure: Secondary | ICD-10-CM

## 2020-12-23 DIAGNOSIS — I491 Atrial premature depolarization: Secondary | ICD-10-CM

## 2020-12-23 LAB — CBC
HCT: 39.9 % (ref 39.0–52.0)
Hemoglobin: 13.8 g/dL (ref 13.0–17.0)
MCH: 32.1 pg (ref 26.0–34.0)
MCHC: 34.6 g/dL (ref 30.0–36.0)
MCV: 92.8 fL (ref 80.0–100.0)
Platelets: 179 10*3/uL (ref 150–400)
RBC: 4.3 MIL/uL (ref 4.22–5.81)
RDW: 13.3 % (ref 11.5–15.5)
WBC: 10.4 10*3/uL (ref 4.0–10.5)
nRBC: 0 % (ref 0.0–0.2)

## 2020-12-23 LAB — HEMOGLOBIN A1C
Hgb A1c MFr Bld: 6 % — ABNORMAL HIGH (ref 4.8–5.6)
Mean Plasma Glucose: 126 mg/dL

## 2020-12-23 LAB — BASIC METABOLIC PANEL
Anion gap: 9 (ref 5–15)
BUN: 32 mg/dL — ABNORMAL HIGH (ref 8–23)
CO2: 27 mmol/L (ref 22–32)
Calcium: 9.1 mg/dL (ref 8.9–10.3)
Chloride: 100 mmol/L (ref 98–111)
Creatinine, Ser: 1.27 mg/dL — ABNORMAL HIGH (ref 0.61–1.24)
GFR, Estimated: >60 mL/min (ref >60)
Glucose, Bld: 99 mg/dL (ref 70–99)
Potassium: 3.7 mmol/L (ref 3.5–5.1)
Sodium: 136 mmol/L (ref 135–145)

## 2020-12-23 LAB — PHOSPHORUS: Phosphorus: 3.6 mg/dL (ref 2.5–4.6)

## 2020-12-23 LAB — MAGNESIUM: Magnesium: 2.2 mg/dL (ref 1.7–2.4)

## 2020-12-23 MED ORDER — FUROSEMIDE 10 MG/ML IJ SOLN
20.0000 mg | Freq: Once | INTRAMUSCULAR | Status: AC
  Administered 2020-12-23: 20 mg via INTRAVENOUS
  Filled 2020-12-23: qty 2

## 2020-12-23 MED ORDER — POTASSIUM CHLORIDE CRYS ER 20 MEQ PO TBCR
40.0000 meq | EXTENDED_RELEASE_TABLET | Freq: Once | ORAL | Status: AC
  Administered 2020-12-23: 40 meq via ORAL
  Filled 2020-12-23: qty 2

## 2020-12-23 MED ORDER — LEVALBUTEROL HCL 0.63 MG/3ML IN NEBU
0.6300 mg | INHALATION_SOLUTION | RESPIRATORY_TRACT | Status: AC
  Administered 2020-12-23: 0.63 mg via RESPIRATORY_TRACT
  Filled 2020-12-23: qty 3

## 2020-12-23 MED ORDER — PRASUGREL HCL 10 MG PO TABS
30.0000 mg | ORAL_TABLET | Freq: Once | ORAL | Status: AC
  Administered 2020-12-23: 30 mg via ORAL
  Filled 2020-12-23: qty 3

## 2020-12-23 MED ORDER — OXYCODONE-ACETAMINOPHEN 7.5-325 MG PO TABS
1.0000 | ORAL_TABLET | Freq: Four times a day (QID) | ORAL | Status: DC | PRN
  Administered 2020-12-23 – 2020-12-25 (×5): 1 via ORAL
  Filled 2020-12-23 (×5): qty 1

## 2020-12-23 MED ORDER — PRASUGREL HCL 10 MG PO TABS
10.0000 mg | ORAL_TABLET | Freq: Every day | ORAL | Status: DC
  Administered 2020-12-24 – 2020-12-26 (×3): 10 mg via ORAL
  Filled 2020-12-23 (×3): qty 1

## 2020-12-23 NOTE — Progress Notes (Signed)
Progress Note  Patient Name: Darryl Gilbert Date of Encounter: 12/23/2020  [facility] Cardiologist: New - [place]  Subjective   Feels short of breath, intermittently waking up feeling like he needs to take a deep breath.  Reports orthopnea as well.  Some chest soreness in center of chest.  No palpitations, lightheadedness, or edema  Inpatient Medications    Scheduled Meds:  aspirin  81 mg Oral Daily   atorvastatin  80 mg Oral Daily   Chlorhexidine Gluconate Cloth  6 each Topical Q0600   docusate  100 mg Oral BID   enoxaparin (LOVENOX) injection  40 mg Subcutaneous Q24H   furosemide  20 mg Intravenous Once   losartan  25 mg Oral Daily   metoprolol tartrate  25 mg Oral BID   polyethylene glycol  17 g Per Tube Daily   potassium chloride  40 mEq Oral Once   [START ON 12/24/2020] prasugrel  10 mg Oral Daily   prasugrel  30 mg Oral Once   sodium chloride flush  3 mL Intravenous Q12H   Continuous Infusions:  sodium chloride 10 mL/hr at 12/22/20 0614   fentaNYL infusion INTRAVENOUS Stopped (12/22/20 1059)   midazolam 5 mg/hr (12/22/20 0951)   norepinephrine (LEVOPHED) Adult infusion Stopped (12/22/20 1206)   PRN Meds: sodium chloride, acetaminophen, cyclobenzaprine, midazolam, sodium chloride flush   Vital Signs    Vitals:   12/23/20 0559 12/23/20 0600 12/23/20 0700 12/23/20 0800  BP:  121/81 130/76 (!) 153/136  Pulse:  69 (!) 57 69  Resp:      Temp:    97.9 F (36.6 C)  TempSrc:    Oral  SpO2: 100% 95% 99% 100%  Weight:      Height:        Intake/Output Summary (Last 24 hours) at 12/23/2020 0831 Last data filed at 12/23/2020 0400 Gross per 24 hour  Intake 700.77 ml  Output 1195 ml  Net -494.23 ml   Last 3 Weights 12/20/2020 12/20/2020  Weight (lbs) 206 lb 5.6 oz 212 lb  Weight (kg) 93.6 kg 96.163 kg      Telemetry    NSR, PAC's, PVC's, and brief NSVT - Personally Reviewed  ECG    12/23/2020 at 8:29 AM: NSR with PAC's and anterolateral TWI.  QTc 473 ms - Personally  reviewed.  Physical Exam   GEN: No acute distress.   Neck: No JVD Cardiac: RRR, no murmurs, rubs, or gallops.  Respiratory: Mildly diminished breath sounds at lung bases.  No wheezes or crackles. GI: Soft, nontender, non-distended  MS: No edema; No deformity. Neuro:  Nonfocal  Psych: Normal affect   Labs    High Sensitivity Troponin:   Recent Labs  Lab 12/20/20 1448 12/20/20 1730 12/20/20 1939 12/20/20 2128  TROPONINIHS 19* 4,521* >24,000* >24,000*      Chemistry Recent Labs  Lab 12/20/20 1448 12/21/20 0543 12/22/20 0222 12/22/20 1216 12/23/20 0541  NA 135 135 136 136 136  K 3.9 3.9 3.5 3.9 3.7  CL 102 104 101 105 100  CO2 '23 23 26 25 27  '$ GLUCOSE 139* 102* 140* 112* 99  BUN 22 26* 28* 28* 32*  CREATININE 1.63* 1.33* 1.37* 1.30* 1.27*  CALCIUM 9.6 9.1 10.8* 8.8* 9.1  PROT 7.5 6.8  --  6.9  --   ALBUMIN 4.1 3.7  --  3.5  --   AST 30 318*  --  139*  --   ALT 22 69*  --  52*  --  ALKPHOS 49 40  --  43  --   BILITOT 1.0 0.6  --  0.9  --   GFRNONAA 47* 60* 58* >60 >60  ANIONGAP '10 8 9 6 9     '$ Hematology Recent Labs  Lab 12/21/20 0543 12/22/20 0222 12/23/20 0541  WBC 10.6* 13.7* 10.4  RBC 4.37 4.65 4.30  HGB 13.8 15.4 13.8  HCT 39.9 43.5 39.9  MCV 91.3 93.5 92.8  MCH 31.6 33.1 32.1  MCHC 34.6 35.4 34.6  RDW 13.6 13.6 13.3  PLT 185 245 179    BNPNo results for input(s): BNP, PROBNP in the last 168 hours.   DDimer No results for input(s): DDIMER in the last 168 hours.   Radiology    DG Chest 1 View  Result Date: 12/22/2020 CLINICAL DATA:  Encounter for central line placement EXAM: CHEST  1 VIEW COMPARISON:  Earlier today FINDINGS: New left IJ line with tip near the SVC origin. No pneumothorax or new mediastinal widening. Enteric and endotracheal tubes in good position. Stable pulmonary inflation and interstitial coarsening. Normal heart size. IMPRESSION: New central line and enteric tube without complicating feature. Stable aeration. Electronically  Signed   By: Monte Fantasia M.D.   On: 12/22/2020 05:04   DG Chest 1 View  Result Date: 12/22/2020 CLINICAL DATA:  OG tube placement, post code EXAM: CHEST  1 VIEW COMPARISON:  None. FINDINGS: Endotracheal tube has been placed with the tip 2 cm above the carina. No OG tube is visualized. Heart is normal size. No confluent airspace opacity, effusion or pneumothorax. No acute bony abnormality. IMPRESSION: Endotracheal tube 2 cm above the carina.  No visible OG tube. No acute cardiopulmonary disease. Electronically Signed   By: Rolm Baptise M.D.   On: 12/22/2020 02:47   DG Chest Port 1 View  Result Date: 12/23/2020 CLINICAL DATA:  65 year old male with respiratory failure. EXAM: PORTABLE CHEST 1 VIEW COMPARISON:  Portable chest 12/22/2020 and earlier. FINDINGS: Portable AP upright view at 0548 hours. Extubated. Enteric tube removed. Stable left IJ central line. Mildly lower lung volumes. Platelike curvilinear opacity at the left lung base is new. Streaky right lung base opacity has mildly increased. Mediastinal contours remain normal. No pneumothorax, pulmonary edema, pleural effusion. No acute osseous abnormality identified. Negative visible bowel gas. IMPRESSION: 1. Extubated and enteric tube removed.  Stable left IJ central line. 2. Lower lung volumes with increased bibasilar opacity most resembling atelectasis. Electronically Signed   By: Genevie Ann M.D.   On: 12/23/2020 06:18   ECHOCARDIOGRAM COMPLETE  Result Date: 12/21/2020    ECHOCARDIOGRAM REPORT   Patient Name:   Darryl Gilbert [facility] Date of Exam: 12/21/2020 Medical Rec #:  [id number]    Height:       73.0 in Accession #:    [id number]   Weight:       206.3 lb Date of Birth:  1955-12-22    BSA:          2.180 m Patient Age:    65 years     BP:           126/75 mmHg Patient Gender: M            HR:           62 bpm. Exam Location:  [facility] Procedure: 2D Echo, 3D Echo and Strain Analysis Indications:     AMI I21.9  History:         Patient has no prior history of  Echocardiogram examinations.  Sonographer:     Kathlen Brunswick RDCS Referring Phys:  [id number] A ARIDA Diagnosing Phys: Kate Sable MD  Sonographer Comments: Global longitudinal strain was attempted. IMPRESSIONS  1. Left ventricular ejection fraction, by estimation, is 50%. The left ventricle has low normal function. The left ventricle demonstrates regional wall motion abnormalities (see scoring diagram/findings for description). There is mild left ventricular hypertrophy. Left ventricular diastolic parameters are consistent with Grade II diastolic dysfunction (pseudonormalization). There is moderate hypokinesis of the left ventricular, entire inferolateral wall.  2. Right ventricular systolic function is normal. The right ventricular size is normal.  3. The mitral valve is normal in structure. No evidence of mitral valve regurgitation.  4. The aortic valve is tricuspid. Aortic valve regurgitation is not visualized. FINDINGS  Left Ventricle: Left ventricular ejection fraction, by estimation, is 50%. The left ventricle has low normal function. The left ventricle demonstrates regional wall motion abnormalities. Moderate hypokinesis of the left ventricular, entire inferolateral  wall. Global longitudinal strain performed but not reported based on interpreter judgement due to suboptimal tracking. 3D left ventricular ejection fraction analysis performed but not reported based on interpreter judgement due to suboptimal quality. The left ventricular internal cavity size was normal in size. There is mild left ventricular hypertrophy. Left ventricular diastolic parameters are consistent with Grade II diastolic dysfunction (pseudonormalization). Right Ventricle: The right ventricular size is normal. No increase in right ventricular wall thickness. Right ventricular systolic function is normal. Left Atrium: Left atrial size was normal in size. Right Atrium: Right atrial size was normal in size. Pericardium: There is  no evidence of pericardial effusion. Mitral Valve: The mitral valve is normal in structure. No evidence of mitral valve regurgitation. Tricuspid Valve: The tricuspid valve is normal in structure. Tricuspid valve regurgitation is not demonstrated. Aortic Valve: The aortic valve is tricuspid. Aortic valve regurgitation is not visualized. Aortic valve peak gradient measures 5.7 mmHg. Pulmonic Valve: The pulmonic valve was not well visualized. Pulmonic valve regurgitation is not visualized. Aorta: The aortic root is normal in size and structure. Venous: The inferior vena cava was not well visualized. IAS/Shunts: No atrial level shunt detected by color flow Doppler.  LEFT VENTRICLE PLAX 2D LVIDd:         4.90 cm      Diastology LVIDs:         3.40 cm      LV e' medial:    5.66 cm/s LV PW:         1.10 cm      LV E/e' medial:  12.9 LV IVS:        1.30 cm      LV e' lateral:   4.35 cm/s LVOT diam:     2.00 cm      LV E/e' lateral: 16.7 LV SV:         40 LV SV Index:   18 LVOT Area:     3.14 cm                              3D Volume EF: LV Volumes (MOD)            3D EF:        55 % LV vol d, MOD A2C: 139.0 ml LV EDV:       246 ml LV vol s, MOD A2C: 75.4 ml  LV ESV:       110 ml LV SV MOD A2C:  63.6 ml  LV SV:        135 ml RIGHT VENTRICLE RV Basal diam:  2.80 cm RV S prime:     13.10 cm/s TAPSE (M-mode): 1.9 cm LEFT ATRIUM             Index       RIGHT ATRIUM           Index LA diam:        2.90 cm 1.33 cm/m  RA Area:     10.90 cm LA Vol (A2C):   46.7 ml 21.42 ml/m RA Volume:   26.00 ml  11.92 ml/m LA Vol (A4C):   37.2 ml 17.06 ml/m LA Biplane Vol: 41.9 ml 19.22 ml/m  AORTIC VALVE AV Area (Vmax): 1.93 cm AV Vmax:        119.00 cm/s AV Peak Grad:   5.7 mmHg LVOT Vmax:      73.10 cm/s LVOT Vmean:     43.500 cm/s LVOT VTI:       0.126 m  AORTA Ao Root diam: 3.30 cm MITRAL VALVE MV Area (PHT): 2.60 cm    SHUNTS MV Decel Time: 292 msec    Systemic VTI:  0.13 m MV E velocity: 72.80 cm/s  Systemic Diam: 2.00 cm MV A  velocity: 53.10 cm/s MV E/A ratio:  1.37 Kate Sable MD Electronically signed by Kate Sable MD Signature Date/Time: 12/21/2020/10:34:11 AM    Final    ECHOCARDIOGRAM LIMITED  Result Date: 12/22/2020    ECHOCARDIOGRAM LIMITED REPORT   Patient Name:   Darryl Gilbert [facility] Date of Exam: 12/22/2020 Medical Rec #:  [id number]    Height:       73.0 in Accession #:    [id number]   Weight:       206.3 lb Date of Birth:  11-Jul-1955    BSA:          2.180 m Patient Age:    43 years     BP:           132/66 mmHg Patient Gender: M            HR:           69 bpm. Exam Location:  [facility] Procedure: Limited Echo Indications:     Cardiac Arrest I46.9  History:         Patient has prior history of Echocardiogram examinations, most                  recent 12/21/2020.  Sonographer:     Kathlen Brunswick RDCS Referring Phys:  [id number] Kate Sable Diagnosing Phys: Ida Rogue MD IMPRESSIONS  1. Limited tudy  2. Left ventricular ejection fraction, by estimation, is 45 to 50%. The left ventricle has mildly decreased function. The left ventricle demonstrates regional wall motion abnormalities (Hypokinesis of the inferolateral wall). There is mild left ventricular hypertrophy.  3. Right ventricular systolic function is normal. The right ventricular size is normal. Tricuspid regurgitation signal is inadequate for assessing PA pressure. FINDINGS  Left Ventricle: Left ventricular ejection fraction, by estimation, is 45 to 50%. The left ventricle has mildly decreased function. The left ventricle demonstrates regional wall motion abnormalities. The left ventricular internal cavity size was normal in size. There is mild left ventricular hypertrophy. Right Ventricle: The right ventricular size is normal. No increase in right ventricular wall thickness. Right ventricular systolic function is normal. Tricuspid regurgitation signal is inadequate for assessing PA pressure. Left Atrium: Left atrial size was normal  in size. Right Atrium: Right  atrial size was normal in size. Pericardium: There is no evidence of pericardial effusion. Mitral Valve: The mitral valve is normal in structure. No evidence of mitral valve stenosis. Tricuspid Valve: The tricuspid valve is normal in structure. Tricuspid valve regurgitation is not demonstrated. No evidence of tricuspid stenosis. Aortic Valve: The aortic valve was not well visualized. Aortic valve regurgitation is not visualized. No aortic stenosis is present. Pulmonic Valve: The pulmonic valve was normal in structure. Pulmonic valve regurgitation is not visualized. No evidence of pulmonic stenosis. Aorta: The aortic root is normal in size and structure. Venous: The inferior vena cava is normal in size with greater than 50% respiratory variability, suggesting right atrial pressure of 3 mmHg. IAS/Shunts: No atrial level shunt detected by color flow Doppler. LEFT VENTRICLE PLAX 2D LVIDd:         4.70 cm LVIDs:         3.50 cm LV PW:         1.30 cm LV IVS:        1.30 cm  LV Volumes (MOD) LV vol d, MOD A4C: 108.0 ml LV vol s, MOD A4C: 50.2 ml LV SV MOD A4C:     108.0 ml Ida Rogue MD Electronically signed by Ida Rogue MD Signature Date/Time: 12/22/2020/4:17:11 PM    Final     Cardiac Studies   LHC/PCI (12/20/2020): 1.  Codominant coronary arteries with thrombotic occlusion of the proximal left circumflex which is the culprit for inferior STEMI.  There is also 70% stenosis in ostial right coronary artery which has high anterior takeoff.  Catheter induced spasm in the ostial right coronary artery cannot be completely excluded. 2.  Moderately reduced LV systolic function with segmental wall motion abnormalities in the left circumflex distribution.  Moderately elevated left ventricular Quin Mcpherson-diastolic pressure at 22 mmHg. 3.  Successful angioplasty and drug-eluting stent placement to the left circumflex.  Diagnostic Dominance: Co-dominant Intervention    Patient Profile     65 y.o. 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 tach V. Fib/torsades de pointes as outlined above.  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  He complains of persistent shortness of breath that began a few hours after MI/PCI.  Query ticagrelor side effect versus HF. - Stop ticagrelor and load with prasugrel 30 mg x 1 today, followed by 10 mg daily beginning tomorrow. - Continue atorvastatin and metoprolol. - Favor medical therapy of ostial/proximal RCA disease but may need to consider relook cath +/- PCI if dyspnea does not improve.  Acute HFpEF due to ischemic cardiomyopathy: LVEF low normal to mildly reduced by echo.  LVEDP moderately elevated at time of STEMI/LHC.  Patient reports dyspnea and orthopnea but does not appear significant volume overloaded on exam. - Give furosemide 20 mg IV x 1. - Continue metoprolol and losartan.  Torsades de Pointes: Occurred post-MI in the setting of marked QT prolongation and IV amiodarone.  PAC's, PVC's and brief NSVT still noted on tele over the last 24 hours.  EKG this AM with improving QTc, though still mildly prolonged.  Patient successfully extubated yesterday. Avoid QT-prolonging medications.  Replete K and Mg to maintain levels greater than 4.0 and 2.0, respectively. Repeat EKG tomorrow. Consider EP consultation tomorrow for further recommendations regarding medical therapy and Lifevest/ICD.  Paroxysmal atrial fibrillation: Occurred in the setting of MI.  No further recurrence noted. - Continue metoprolol. - Defer anticoagulation for now in the setting of ongoing DAPT (see above). -  Consider outpatient cardiac monitoring if no further evidence of a-fib during hospitalization.  Hypertension: BP somewhat labile over last 24 hours. - Continue current doses of metoprolol and losartan.  Disposition: - Continue ICU or stepdown level monitoring.    For questions or updates, please contact [place] Please consult [email or web address] for contact info under [facility].  Signed, Nelva Bush, MD  12/23/2020, 8:31 AM

## 2020-12-23 NOTE — Progress Notes (Signed)
   12/23/20 1330  Clinical Encounter Type  Visited With Patient  Visit Type Initial;Spiritual support;Social support  Referral From Chaplain  Consult/Referral To [employer] met with Mr. Pridgeon and established spiritual care.PT spoke of living locally, and spoke of his struggle to find psychical rest, currently. Chaplain ministered with compassionate presence, and empathetic listening.  Andee Poles, M. Div.

## 2020-12-23 NOTE — Progress Notes (Signed)
NAME:  Darryl Gilbert, MRN:  [id number], DOB:  Jun 12, 1955, LOS: 3 ADMISSION DATE:  12/20/2020, CONSULTATION DATE:  12/22/2020 REFERRING MD: Noberto Retort MD CHIEF COMPLAINT:  Chest Pain    HPI  65 y.o male with significant PMH of hypertension tobacco use and chronic back pain who presented to the ED with chief complaints of chest pain.  Patient unable to provide history so history mostly obtained from patient's chart.  Per his chart, patient complained of substernal chest pain radiating to his neck and left arm.  He apparently described pain as heavy sensation in his chest rating 10 out of a 10 on a pain intensity rating scale associated with dizziness and shortness of breath EMS was called and patient was found to have significant anterior ST elevation on his EKG hence code STEMI was activated.  He received aspirin and 100 mics of fentanyl but no nitroglycerin due to hypotension.  Patient was transported to the ED for further evaluation.  ED Course: On arrival to the ED, he was afebrile with blood pressure 146/116 mm Hg and pulse rate 76 beats/min, respiration 23, oxygen saturation 100% on room air. There were no focal neurological deficits; he was alert and oriented x4.  Initial twelve-lead EKG showed inferior STEMI, sinus rhythm with rates in the 130s.  Patient received IV metoprolol and cardiology consulted in the ED who recommended heparinization and left heart cath emergently.  Patient was taken to Cath Lab emergently.  Patient had PCI to left circumflex.  70% RCA also noted. Echo showed low normal EF 50%.  Inferolateral wall hypokinesis.  In the OR patient went into into brief episode of A. fib after intervention, amiodarone drip was started, patient has maintained sinus rhythm since.  Patient was transferred to the ICU post cath. Initial pertinent labs/Diagnostics WBC/Hgb/Hct/Plts:  10.4/13.8/39.9/179 (09/06 0541)  Glucose 139, BUN 22, creatinine 1.63 Troponin 19>4521>>24,000>>24,000 EKG: Inferior ST  elevation MI  Hospital Course: During the course of his recovering in the ICU, patient went into polymorphic ventricular tachycardia that degenerated quickly into ventricular fibrillation.  CODE BLUE was initiated and patient required 3 rounds of defibrillation following administration of 2 mg of Versed and 50 of fentanyl.  Twelve-lead EKG was obtained and showed no significant ST elevation but was noted with prolonged QT interval greater than 550 ms consistent with torsades de points.  Cardiology was started 5 who came to the bedside and recommended discontinuation of amiodarone drip.  Patient was ready received sodium bicarb calcium gluconate and 2 mg of IV magnesium.  Patient then went into SVT and received 5 mg of IV metoprolol per cardiology with conversion to sinus rhythm and gradual shortening of QT interval.  PCCM consulted for further management.  Past Medical History  Hypertension Back pain  Significant Hospital Events   9/3: Patient presenting with Inferior lateral STEMI  status post DES placement to the proximal left circumflex artery 9/4: Patient went into polymorphic ventricular tachycardia s/p defibrillation.  Intubated.  PCCM consulted  Consults:  Cardiology PCCM  Procedures:  9/3: Cardiac cath 9/4: Left IJ placement 9/4: Left femoral art line 9/4: Mechanical intubation  Significant Diagnostic Tests:  9/3: Chest Xray> no active cardiopulmonary process  Micro Data:  9/3: SARS-CoV-2 PCR> negative 9/3: Influenza PCR> negative  Antimicrobials:  None required   OBJECTIVE  Blood pressure 121/81, pulse 69, temperature 98.1 F (36.7 C), resp. rate 20, height 6' 0.99" (1.854 m), weight 93.6 kg, SpO2 95 %.    FiO2 (%):  [40 %]  40 %   Intake/Output Summary (Last 24 hours) at 12/23/2020 0759 Last data filed at 12/23/2020 0400 Gross per 24 hour  Intake 804.03 ml  Output 1195 ml  Net -390.97 ml    Filed Weights   12/20/20 1447 12/20/20 1641  Weight: 96.2 kg 93.6 kg    Physical Examination  GENERAL: Age appropriate NAD  EYES: Pupils equal, round, reactive to light and accommodation. No scleral icterus. Extraocular muscles intact.  HEENT: Head atraumatic, normocephalic. Oropharynx and nasopharynx clear.  NECK:  Supple, no jugular venous distention. No thyroid enlargement, no tenderness.  LUNGS: Normal breath sounds bilaterally, no wheezing, rales,rhonchi or crepitation. No use of accessory muscles of respiration.  CARDIOVASCULAR: S1, S2 normal. No murmurs, rubs, or gallops.  ABDOMEN: Soft, nontender, nondistended. Bowel sounds present. No organomegaly or mass.  EXTREMITIES: No pedal edema, cyanosis, or clubbing.  NEUROLOGIC: Cranial nerves II through XII are intact.  Muscle strength not assessed.  Sensation intact. Gait not checked.  PSYCHIATRIC: The patient is intubated and sedated SKIN: No obvious rash, lesion, or ulcer.   Labs/imaging that I havepersonally reviewed  (right click and "Reselect all SmartList Selections" daily)     Labs   CBC: Recent Labs  Lab 12/20/20 1448 12/21/20 0543 12/22/20 0222 12/23/20 0541  WBC 9.9 10.6* 13.7* 10.4  NEUTROABS 6.1  --   --   --   HGB 15.2 13.8 15.4 13.8  HCT 42.9 39.9 43.5 39.9  MCV 91.5 91.3 93.5 92.8  PLT 250 185 245 179     Basic Metabolic Panel: Recent Labs  Lab 12/20/20 1448 12/21/20 0543 12/22/20 0222 12/22/20 1216 12/23/20 0541  NA 135 135 136 136 136  K 3.9 3.9 3.5 3.9 3.7  CL 102 104 101 105 100  CO2 '23 23 26 25 27  '$ GLUCOSE 139* 102* 140* 112* 99  BUN 22 26* 28* 28* 32*  CREATININE 1.63* 1.33* 1.37* 1.30* 1.27*  CALCIUM 9.6 9.1 10.8* 8.8* 9.1  MG 1.7  --  2.2  --  2.2  PHOS  --   --   --   --  3.6    GFR: Estimated Creatinine Clearance: 66.4 mL/min (A) (by C-G formula based on SCr of 1.27 mg/dL (H)). Recent Labs  Lab 12/20/20 1448 12/21/20 0543 12/22/20 0222 12/23/20 0541  WBC 9.9 10.6* 13.7* 10.4     Liver Function Tests: Recent Labs  Lab 12/20/20 1448  12/21/20 0543 12/22/20 1216  AST 30 318* 139*  ALT 22 69* 52*  ALKPHOS 49 40 43  BILITOT 1.0 0.6 0.9  PROT 7.5 6.8 6.9  ALBUMIN 4.1 3.7 3.5    No results for input(s): LIPASE, AMYLASE in the last 168 hours. No results for input(s): AMMONIA in the last 168 hours.  ABG    Component Value Date/Time   PHART 7.40 12/22/2020 0251   PCO2ART 45 12/22/2020 0251   PO2ART 388 (H) 12/22/2020 0251   HCO3 27.9 12/22/2020 0251   O2SAT 100.0 12/22/2020 0251     Coagulation Profile: Recent Labs  Lab 12/20/20 1448  INR 1.0     Cardiac Enzymes: No results for input(s): CKTOTAL, CKMB, CKMBINDEX, TROPONINI in the last 168 hours.  HbA1C: No results found for: HGBA1C  CBG: Recent Labs  Lab 12/20/20 1642  [place] 115*     Review of Systems:   UNABLE TO OBTAIN PATIENT IS INTUBATED AD SEDATED  Past Medical History  He,  has a past medical history of Back pain and Hypertension.  Surgical History   History reviewed. No pertinent surgical history.   Social History   reports that he has been smoking. He has never used smokeless tobacco.   Family History   His family history is not on file.   Allergies No Known Allergies   Home Medications  Prior to Admission medications   Medication Sig Start Date End Date Taking? Authorizing Provider  cyclobenzaprine (FLEXERIL) 10 MG tablet Take 1 tablet (10 mg total) by mouth 3 (three) times daily as needed. 12/12/17   Sable Feil, PA-C  losartan (COZAAR) 100 MG tablet Take 100 mg by mouth daily.    [provider]  meloxicam (MOBIC) 15 MG tablet Take 1 tablet (15 mg total) by mouth daily. 12/12/17   Sable Feil, PA-C  olmesartan (BENICAR) 20 MG tablet Take 20 mg by mouth daily.    [provider]  oxyCODONE-acetaminophen (PERCOCET) 7.5-325 MG tablet Take 1 tablet by mouth every 6 (six) hours as needed. 12/12/17   Sable Feil, PA-C    Scheduled Meds:  aspirin  81 mg Oral Daily   atorvastatin  80 mg Oral Daily    Chlorhexidine Gluconate Cloth  6 each Topical Q0600   docusate  100 mg Oral BID   enoxaparin (LOVENOX) injection  40 mg Subcutaneous Q24H   losartan  25 mg Oral Daily   metoprolol tartrate  25 mg Oral BID   polyethylene glycol  17 g Per Tube Daily   sodium chloride flush  3 mL Intravenous Q12H   ticagrelor  90 mg Oral BID   Continuous Infusions:  sodium chloride 10 mL/hr at 12/22/20 [id number]   fentaNYL infusion INTRAVENOUS Stopped (12/22/20 1059)   midazolam 5 mg/hr (12/22/20 0951)   norepinephrine (LEVOPHED) Adult infusion Stopped (12/22/20 1206)   PRN Meds:.sodium chloride, acetaminophen, cyclobenzaprine, midazolam, sodium chloride flush  Assessment & Plan:  Acute hypoxic respiratory failure in the setting of cardiac dysrhythmia  PMHx:presumed OSA, tobacco abuse -Ventilator settings: PRVC 8 mL/kg, 90 % FiO2, 5 PEEP -Wean PEEP and FiO2 for sats greater than 90% -Plateau pressures less than 30 cm H20 -VAP bundle in place -Intermittent chest x-ray & ABG -WUA/ SBT as appropriate -Ensure adequate pulmonary hygiene  -Nicotine patch ordered -Budesonide nebs BID, bronchodilators PRN -PAD protocol in place: continue Fentanyl drip & Versed drip&IVP  Polymorphic ventricular tachycardia/ventricular fibrillation in the setting of prolonged QTc S/p Defibrillation (100-200J biphasic) under sedation  -Serial EKG -Replace electrolytes as needed -Monitor BMP+Ma -Avoid QTC prolongation agents -Metoprolol PRN -Keep NPO  -STOP Amiodarone    Inferior Lateral STEMI status post DES placement to the proximal left circumflex artery, 70% RCA also noted Echo showed low normal EF 50%.  Inferolateral wall hypokinesis -ASA '81mg'$  PO daily +Brilinta -Atorvastatin '80mg'$  PO daily, check lipid panel  -Continue metoprolol (Hold if SBP<90 / cardiogenic shock)  -Cardiology following  Acute systolic heart failure/post MI Cardiomyopathy -Hypertension -Post MI A-fib with RVR -Continuous cardiac  monitoring -Levophed to maintain MAP greater than 65 -Continue metoprolol as above -Losartan as BP and renal function permits -TSH, FT4 -Stop amiodarone as above -Cardiology following, appreciate input   Best practice:  Diet:  Tube Feed  Pain/Anxiety/Delirium protocol (if indicated): Yes (RASS goal -1) VAP protocol (if indicated): Yes DVT prophylaxis: LMWH GI prophylaxis: N/A Glucose control:  SSI No Central venous access:  Yes, and it is still needed Arterial line:  Yes, and it is still needed Foley:  Yes, and it is still needed Mobility:  bed rest  PT consulted: N/A Last date of multidisciplinary goals of care discussion [9/5] Code Status:  full code Disposition: ICU   = Goals of Care = Code Status Order: FULL  Primary Emergency Contact: DANIELS,GAIL, Home Phone: [phone] Wishes to pursue full aggressive treatment and intervention options, including CPR and intubation, goals of care will be addressed on going with family if that should become necessary.   Critical care provider statement:   Total critical care time: 33 minutes   Performed by: Lanney Gins MD   Critical care time was exclusive of separately billable procedures and treating other patients.   Critical care was necessary to treat or prevent imminent or life-threatening deterioration.   Critical care was time spent personally by me on the following activities: development of treatment plan with patient and/or surrogate as well as nursing, discussions with consultants, evaluation of patient's response to treatment, examination of patient, obtaining history from patient or surrogate, ordering and performing treatments and interventions, ordering and review of laboratory studies, ordering and review of radiographic studies, pulse oximetry and re-evaluation of patient's condition.    Ottie Glazier, M.D.  Pulmonary & Critical Care Medicine     .

## 2020-12-24 DIAGNOSIS — I480 Paroxysmal atrial fibrillation: Secondary | ICD-10-CM

## 2020-12-24 DIAGNOSIS — I499 Cardiac arrhythmia, unspecified: Secondary | ICD-10-CM

## 2020-12-24 LAB — BASIC METABOLIC PANEL
Anion gap: 8 (ref 5–15)
BUN: 30 mg/dL — ABNORMAL HIGH (ref 8–23)
CO2: 28 mmol/L (ref 22–32)
Calcium: 9.5 mg/dL (ref 8.9–10.3)
Chloride: 102 mmol/L (ref 98–111)
Creatinine, Ser: 1.4 mg/dL — ABNORMAL HIGH (ref 0.61–1.24)
GFR, Estimated: 56 mL/min — ABNORMAL LOW (ref >60)
Glucose, Bld: 96 mg/dL (ref 70–99)
Potassium: 4.2 mmol/L (ref 3.5–5.1)
Sodium: 138 mmol/L (ref 135–145)

## 2020-12-24 LAB — CBC
HCT: 43.1 % (ref 39.0–52.0)
Hemoglobin: 14.8 g/dL (ref 13.0–17.0)
MCH: 31.5 pg (ref 26.0–34.0)
MCHC: 34.3 g/dL (ref 30.0–36.0)
MCV: 91.7 fL (ref 80.0–100.0)
Platelets: 204 10*3/uL (ref 150–400)
RBC: 4.7 MIL/uL (ref 4.22–5.81)
RDW: 13 % (ref 11.5–15.5)
WBC: 10 10*3/uL (ref 4.0–10.5)
nRBC: 0 % (ref 0.0–0.2)

## 2020-12-24 LAB — PHOSPHORUS: Phosphorus: 3.9 mg/dL (ref 2.5–4.6)

## 2020-12-24 LAB — MAGNESIUM: Magnesium: 2.3 mg/dL (ref 1.7–2.4)

## 2020-12-24 MED ORDER — LIDOCAINE IN D5W 4-5 MG/ML-% IV SOLN
1.0000 mg/min | INTRAVENOUS | Status: DC
  Administered 2020-12-24: 1 mg/min via INTRAVENOUS
  Filled 2020-12-24: qty 500

## 2020-12-24 MED ORDER — LIDOCAINE BOLUS VIA INFUSION
75.0000 mg | Freq: Once | INTRAVENOUS | Status: AC
  Administered 2020-12-24: 75 mg via INTRAVENOUS
  Filled 2020-12-24: qty 76

## 2020-12-24 NOTE — Progress Notes (Signed)
Patient ID: Darryl Gilbert, male   DOB: 04/21/1955, 65 y.o.   MRN: [id number]  PROGRESS NOTE    Darryl Gilbert  [id number] DOB: Aug 10, 1955 DOA: 12/20/2020 PCP: Patient, No Pcp Per (Inactive)   Brief Narrative:  65 year old male with history of hypertension, chronic back pain, tobacco use presented with chest pain and was found to have inferior lateral STEMI.  Cardiology was consulted; he underwent cardiac cath and DES to left circumflex.  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  He had episodes of A. fib for which amiodarone was started.  He was transferred to ICU postcardiac catheterization.  Subsequently he had polymorphic VT and V. fib requiring defibrillation; amiodarone drip was discontinued.  He was subsequently intubated on 12/21/2020.  He was extubated on 12/22/2020.  He was transferred back to [facility] service on 12/24/2020.  Assessment & Plan:   Acute respiratory failure with hypoxia in the setting of cardiac dysrhythmia -Patient required intubation on 12/21/2020 after polymorphic VT and V. fib requiring defibrillation -Extubated on 12/22/2020 -Currently on 2 L oxygen via nasal cannula.  Wean off as able.  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  Continue aspirin, prasugrel, statin and metoprolol  Acute systolic heart failure due to ischemic cardiomyopathy -Echo showed EF of 45 to 50%.  Patient received 1 dose of IV Lasix on 12/23/2020.  Further diuretic dosing as per cardiology. -Strict input and output.  Daily weights.  Fluid restriction.  Continue metoprolol and losartan  [place] Polymorphic ventricular tachycardia/V. fib in the setting of prolonged QTC -Status post defibrillation.  Off amiodarone -Avoid QT prolonging medications. -Maintain potassium above 4 and magnesium above 2 -Follow  cardiology recommendations  Paroxysmal A. fib -Currently intermittently tachycardic.  Continue metoprolol.  Anticoagulation not started by cardiology in the setting of ongoing DAPT  Hypertension -Blood pressure stable.  Continue metoprolol and losartan  Hyperlipidemia  -continue Lipitor  Chronic tobacco use -Cessation advised by prior hospitalist  Leukocytosis -Resolved  DVT prophylaxis: Lovenox Code Status: Full Family Communication: None at bedside Disposition Plan: Status is: Inpatient  Remains inpatient appropriate because:Inpatient level of care appropriate due to severity of illness  Dispo: The patient is from: Home              Anticipated d/c is to: Home              Patient currently is not medically stable to d/c.   Difficult to place patient No   Consultants: PCCM/cardiology  Procedures: Cardiac catheterization/echo/intubations and extubation  Antimicrobials:  None  Subjective: Patient seen and examined at bedside.  Denies worsening shortness of breath or chest pain.  No overnight fever or vomiting reported.  Objective: Vitals:   12/24/20 0756 12/24/20 0800 12/24/20 1000 12/24/20 1100  BP:  128/70 136/73 113/79  Pulse:  (!) 59 (!) 119 94  Resp:  12 20 (!) 21  Temp: 97.6 F (36.4 C)     TempSrc: Oral     SpO2:  100% (!) 80% 99%  Weight:      Height:        Intake/Output Summary (Last 24 hours) at 12/24/2020 1244 Last data filed at 12/24/2020 0549 Gross per 24 hour  Intake 349.31 ml  Output 1800 ml  Net -1450.69 ml   Filed Weights   12/20/20 1447 12/20/20 1641  Weight: 96.2  kg 93.6 kg    Examination:  General exam: Appears calm and comfortable.  Currently on 2 L oxygen via nasal cannula.  Looks deconditioned Respiratory system: Bilateral decreased breath sounds at bases with some scattered crackles Cardiovascular system: S1 & S2 heard, intermittently tachycardic gastrointestinal system: Abdomen is nondistended, soft and nontender. Normal  bowel sounds heard. Extremities: No cyanosis, clubbing, edema  Central nervous system: Alert and oriented. No focal neurological deficits. Moving extremities Skin: No rashes, lesions or ulcers Psychiatry: Judgement and insight appear normal. Mood & affect appropriate.     Data Reviewed: I have personally reviewed following labs and imaging studies  CBC: Recent Labs  Lab 12/20/20 1448 12/21/20 0543 12/22/20 0222 12/23/20 0541 12/24/20 0540  WBC 9.9 10.6* 13.7* 10.4 10.0  NEUTROABS 6.1  --   --   --   --   HGB 15.2 13.8 15.4 13.8 14.8  HCT 42.9 39.9 43.5 39.9 43.1  MCV 91.5 91.3 93.5 92.8 91.7  PLT 250 185 245 179 0000000   Basic Metabolic Panel: Recent Labs  Lab 12/20/20 1448 12/21/20 0543 12/22/20 0222 12/22/20 1216 12/23/20 0541 12/24/20 0540  NA 135 135 136 136 136 138  K 3.9 3.9 3.5 3.9 3.7 4.2  CL 102 104 101 105 100 102  CO2 '23 23 26 25 27 28  '$ GLUCOSE 139* 102* 140* 112* 99 96  BUN 22 26* 28* 28* 32* 30*  CREATININE 1.63* 1.33* 1.37* 1.30* 1.27* 1.40*  CALCIUM 9.6 9.1 10.8* 8.8* 9.1 9.5  MG 1.7  --  2.2  --  2.2 2.3  PHOS  --   --   --   --  3.6 3.9   GFR: Estimated Creatinine Clearance: 60.2 mL/min (A) (by C-G formula based on SCr of 1.4 mg/dL (H)). Liver Function Tests: Recent Labs  Lab 12/20/20 1448 12/21/20 0543 12/22/20 1216  AST 30 318* 139*  ALT 22 69* 52*  ALKPHOS 49 40 43  BILITOT 1.0 0.6 0.9  PROT 7.5 6.8 6.9  ALBUMIN 4.1 3.7 3.5   No results for input(s): LIPASE, AMYLASE in the last 168 hours. No results for input(s): AMMONIA in the last 168 hours. Coagulation Profile: Recent Labs  Lab 12/20/20 1448  INR 1.0   Cardiac Enzymes: No results for input(s): CKTOTAL, CKMB, CKMBINDEX, TROPONINI in the last 168 hours. BNP (last 3 results) No results for input(s): PROBNP in the last 8760 hours. HbA1C: No results for input(s): HGBA1C in the last 72 hours. CBG: Recent Labs  Lab 12/20/20 1642  GLUCAP 115*   Lipid Profile: No results for  input(s): CHOL, HDL, LDLCALC, TRIG, CHOLHDL, LDLDIRECT in the last 72 hours. Thyroid Function Tests: No results for input(s): TSH, T4TOTAL, FREET4, T3FREE, THYROIDAB in the last 72 hours. Anemia Panel: No results for input(s): VITAMINB12, FOLATE, FERRITIN, TIBC, IRON, RETICCTPCT in the last 72 hours. Sepsis Labs: No results for input(s): PROCALCITON, LATICACIDVEN in the last 168 hours.  Recent Results (from the past 240 hour(s))  Resp Panel by RT-PCR (Flu A&B, Covid) Nasopharyngeal Swab     Status: None   Collection Time: 12/20/20  2:48 PM   Specimen: Nasopharyngeal Swab; Nasopharyngeal(NP) swabs in vial transport medium  Result Value Ref Range Status   SARS Coronavirus 2 by RT PCR NEGATIVE NEGATIVE Final    Comment: (NOTE) SARS-CoV-2 target nucleic acids are NOT DETECTED.  The SARS-CoV-2 RNA is generally detectable in upper respiratory specimens during the acute phase of infection. The lowest concentration of SARS-CoV-2 viral copies this assay  can detect is 138 copies/mL. A negative result does not preclude SARS-Cov-2 infection and should not be used as the sole basis for treatment or other patient management decisions. A negative result may occur with  improper specimen collection/handling, submission of specimen other than nasopharyngeal swab, presence of viral mutation(s) within the areas targeted by this assay, and inadequate number of viral copies(<138 copies/mL). A negative result must be combined with clinical observations, patient history, and epidemiological information. The expected result is Negative.  Fact Sheet for Patients:  EntrepreneurPulse.com.au  Fact Sheet for Healthcare Providers:  IncredibleEmployment.be  This test is no t yet approved or cleared by the [place] FDA and  has been authorized for detection and/or diagnosis of SARS-CoV-2 by FDA under an Emergency Use Authorization (EUA). This EUA will remain  in effect  (meaning this test can be used) for the duration of the COVID-19 declaration under Section 564(b)(1) of the Act, 21 U.S.C.section 360bbb-3(b)(1), unless the authorization is terminated  or revoked sooner.       Influenza A by PCR NEGATIVE NEGATIVE Final   Influenza B by PCR NEGATIVE NEGATIVE Final    Comment: (NOTE) The Xpert Xpress SARS-CoV-2/FLU/RSV plus assay is intended as an aid in the diagnosis of influenza from Nasopharyngeal swab specimens and should not be used as a sole basis for treatment. Nasal washings and aspirates are unacceptable for Xpert Xpress SARS-CoV-2/FLU/RSV testing.  Fact Sheet for Patients: EntrepreneurPulse.com.au  Fact Sheet for Healthcare Providers: IncredibleEmployment.be  This test is not yet approved or cleared by the [place] FDA and has been authorized for detection and/or diagnosis of SARS-CoV-2 by FDA under an Emergency Use Authorization (EUA). This EUA will remain in effect (meaning this test can be used) for the duration of the COVID-19 declaration under Section 564(b)(1) of the Act, 21 U.S.C. section 360bbb-3(b)(1), unless the authorization is terminated or revoked.  Performed at [facility], [address] [place], [place] [address]   MRSA Next Gen by PCR, Nasal     Status: None   Collection Time: 12/20/20  4:43 PM   Specimen: Nasal Mucosa; Nasal Swab  Result Value Ref Range Status   MRSA by PCR Next Gen NOT DETECTED NOT DETECTED Final    Comment: (NOTE) The GeneXpert MRSA Assay (FDA approved for NASAL specimens only), is one component of a comprehensive MRSA colonization surveillance program. It is not intended to diagnose MRSA infection nor to guide or monitor treatment for MRSA infections. Test performance is not FDA approved in patients less than 4 years old. Performed at [facility], [address] [place], [place] [address]          Radiology Studies: [facility]  Chest [place] 1 View  Result Date: 12/23/2020 CLINICAL DATA:  65 year old male with respiratory failure. EXAM: PORTABLE CHEST 1 VIEW COMPARISON:  Portable chest 12/22/2020 and earlier. FINDINGS: Portable AP upright view at 0548 hours. Extubated. Enteric tube removed. Stable left IJ central line. Mildly lower lung volumes. Platelike curvilinear opacity at the left lung base is new. Streaky right lung base opacity has mildly increased. Mediastinal contours remain normal. No pneumothorax, pulmonary edema, pleural effusion. No acute osseous abnormality identified. Negative visible bowel gas. IMPRESSION: 1. Extubated and enteric tube removed.  Stable left IJ central line. 2. Lower lung volumes with increased bibasilar opacity most resembling atelectasis. Electronically Signed   By: Genevie Ann M.D.   On: 12/23/2020 06:18   ECHOCARDIOGRAM LIMITED  Result Date: 12/22/2020    ECHOCARDIOGRAM LIMITED REPORT  Patient Name:   Darryl Gilbert [facility] Date of Exam: 12/22/2020 Medical Rec #:  [id number]    Height:       73.0 in Accession #:    [id number]   Weight:       206.3 lb Date of Birth:  10/15/1955    BSA:          2.180 m Patient Age:    43 years     BP:           132/66 mmHg Patient Gender: M            HR:           69 bpm. Exam Location:  [facility] Procedure: Limited Echo Indications:     Cardiac Arrest I46.9  History:         Patient has prior history of Echocardiogram examinations, most                  recent 12/21/2020.  Sonographer:     Kathlen Brunswick RDCS Referring Phys:  [id number] Kate Sable Diagnosing Phys: Ida Rogue MD IMPRESSIONS  1. Limited tudy  2. Left ventricular ejection fraction, by estimation, is 45 to 50%. The left ventricle has mildly decreased function. The left ventricle demonstrates regional wall motion abnormalities (Hypokinesis of the inferolateral wall). There is mild left ventricular hypertrophy.  3. Right ventricular systolic function is normal. The right ventricular size is normal. Tricuspid  regurgitation signal is inadequate for assessing PA pressure. FINDINGS  Left Ventricle: Left ventricular ejection fraction, by estimation, is 45 to 50%. The left ventricle has mildly decreased function. The left ventricle demonstrates regional wall motion abnormalities. The left ventricular internal cavity size was normal in size. There is mild left ventricular hypertrophy. Right Ventricle: The right ventricular size is normal. No increase in right ventricular wall thickness. Right ventricular systolic function is normal. Tricuspid regurgitation signal is inadequate for assessing PA pressure. Left Atrium: Left atrial size was normal in size. Right Atrium: Right atrial size was normal in size. Pericardium: There is no evidence of pericardial effusion. Mitral Valve: The mitral valve is normal in structure. No evidence of mitral valve stenosis. Tricuspid Valve: The tricuspid valve is normal in structure. Tricuspid valve regurgitation is not demonstrated. No evidence of tricuspid stenosis. Aortic Valve: The aortic valve was not well visualized. Aortic valve regurgitation is not visualized. No aortic stenosis is present. Pulmonic Valve: The pulmonic valve was normal in structure. Pulmonic valve regurgitation is not visualized. No evidence of pulmonic stenosis. Aorta: The aortic root is normal in size and structure. Venous: The inferior vena cava is normal in size with greater than 50% respiratory variability, suggesting right atrial pressure of 3 mmHg. IAS/Shunts: No atrial level shunt detected by color flow Doppler. LEFT VENTRICLE PLAX 2D LVIDd:         4.70 cm LVIDs:         3.50 cm LV PW:         1.30 cm LV IVS:        1.30 cm  LV Volumes (MOD) LV vol d, MOD A4C: 108.0 ml LV vol s, MOD A4C: 50.2 ml LV SV MOD A4C:     108.0 ml Ida Rogue MD Electronically signed by Ida Rogue MD Signature Date/Time: 12/22/2020/4:17:11 PM    Final         Scheduled Meds:  aspirin  81 mg Oral Daily   atorvastatin  80 mg  Oral Daily   Chlorhexidine Gluconate Cloth  6  each Topical Q0600   docusate  100 mg Oral BID   enoxaparin (LOVENOX) injection  40 mg Subcutaneous Q24H   losartan  25 mg Oral Daily   metoprolol tartrate  25 mg Oral BID   polyethylene glycol  17 g Per Tube Daily   prasugrel  10 mg Oral Daily   sodium chloride flush  3 mL Intravenous Q12H   Continuous Infusions:  sodium chloride 10 mL/hr at 12/22/20 [id number]          Aline August, MD [facility] 12/24/2020, 12:44 PM

## 2020-12-24 NOTE — Consult Note (Signed)
ELECTROPHYSIOLOGY CONSULT NOTE  Patient ID: Darryl Gilbert, MRN: [id number], DOB/AGE: [id number] y.o. Admit date: 12/20/2020 Date of Consult: 12/24/2020  Primary Physician: Patient, No Pcp Per (Inactive) Primary Cardiologist: Darryl Gilbert is a 65 y.o. male who is being seen today for the evaluation of TdP at the request of CE/BAE.   Chief Complaint: arrhythmia   HPI Darryl Gilbert is a 65 y.o. male admitted 9/3 with acute IMI, presenting with chest pain with radiation into his neck and arm.  Also found to have atrial fibrillation with a rapid rate.  Underwent emergent catheterization demonstrating 100% stenosis of his proximal circumflex--stented, 70% proximal RCA; 60% OM1.  EF 35-40%.  Amiodarone started for atrial fibrillation  Hospital day 2 developed polymorphic ventricular tachycardia in a pattern consistent with torsade.  Degenerated into ventricular fibrillation requiring multiple shocks.  No ST elevation was noted.  QT prolongation of greater than 550 ms.  Potassium was 3.5.  Received IV magnesium, IV metoprolol for recurrent SVT, and his amiodarone was discontinued.  No prior history of syncope  Prior episodes of recurrent abrupt onset offset tachy palpitations duration typically just minutes, nothing sustained.  ECG QTc  12/24/20 414 12/23/20 507  12/22/20 710  12/20/20    459    Past Medical History:  Diagnosis Date   Back pain    Hypertension       Surgical History:  Past Surgical History:  Procedure Laterality Date   CORONARY/GRAFT ACUTE MI REVASCULARIZATION N/A 12/20/2020   Procedure: Coronary/Graft Acute MI Revascularization;  Surgeon: Wellington Hampshire, MD;  Location: [facility];  Service: Cardiovascular;  Laterality: N/A;   LEFT HEART CATH AND CORONARY ANGIOGRAPHY N/A 12/20/2020   Procedure: LEFT HEART CATH AND CORONARY ANGIOGRAPHY;  Surgeon: Wellington Hampshire, MD;  Location: [facility];  Service: Cardiovascular;  Laterality: N/A;     Home  Meds: Prior to Admission medications   Medication Sig Start Date End Date Taking? Authorizing Provider  cyclobenzaprine (FLEXERIL) 10 MG tablet Take 1 tablet (10 mg total) by mouth 3 (three) times daily as needed. 12/12/17   Sable Feil, PA-C  losartan (COZAAR) 100 MG tablet Take 100 mg by mouth daily.    [provider]  meloxicam (MOBIC) 15 MG tablet Take 1 tablet (15 mg total) by mouth daily. 12/12/17   Sable Feil, PA-C  olmesartan (BENICAR) 20 MG tablet Take 20 mg by mouth daily.    [provider]  oxyCODONE-acetaminophen (PERCOCET) 7.5-325 MG tablet Take 1 tablet by mouth every 6 (six) hours as needed. 12/12/17   Sable Feil, PA-C    Inpatient Medications:   aspirin  81 mg Oral Daily   atorvastatin  80 mg Oral Daily   Chlorhexidine Gluconate Cloth  6 each Topical Q0600   docusate  100 mg Oral BID   enoxaparin (LOVENOX) injection  40 mg Subcutaneous Q24H   losartan  25 mg Oral Daily   metoprolol tartrate  25 mg Oral BID   polyethylene glycol  17 g Per Tube Daily   prasugrel  10 mg Oral Daily   sodium chloride flush  3 mL Intravenous Q12H      Allergies: No Known Allergies  Social History   Socioeconomic History   Marital status: Divorced    Spouse name: Not on file   Number of children: Not on file   Years of education: Not on file   Highest education level: Not on file  Occupational History   Not on file  Tobacco Use   Smoking status: Every Day   Smokeless tobacco: Never  Substance and Sexual Activity   Alcohol use: Not on file   Drug use: Not on file   Sexual activity: Not on file  Other Topics Concern   Not on file  Social History Narrative   Not on file   Social Determinants of Health   Financial Resource Strain: Not on file  Food Insecurity: Not on file  Transportation Needs: Not on file  Physical Activity: Not on file  Stress: Not on file  Social Connections: Not on file  Intimate Partner Violence: Not on file      History reviewed. No pertinent family history.   ROS:  Please see the history of present illness.     All other systems reviewed and negative.    Physical Exam: Blood pressure 114/79, pulse (!) 106, temperature 98.2 F (36.8 C), resp. rate 17, height 6' 0.99" (1.854 m), weight 93.6 kg, SpO2 97 %. General: Well developed, well nourished male in no acute distress. Head: Normocephalic, atraumatic, sclera non-icteric, no xanthomas, nares are without discharge. EENT: normal Lymph Nodes:  none Back: without scoliosis/kyphosis , no CVA tendersness Neck: Negative for carotid bruits. JVD not elevated. Lungs: Clear bilaterally to auscultation without wheezes, rales, or rhonchi. Breathing is unlabored. Heart: RRR with S1 S2. No murmur , rubs, or gallops appreciated. Abdomen: Soft, non-tender, non-distended with normoactive bowel sounds. No hepatomegaly. No rebound/guarding. No obvious abdominal masses. Msk:  Strength and tone appear normal for age. Extremities: No clubbing or cyanosis. No edema.  Distal pedal pulses are 2+ and equal bilaterally. Skin: Warm and Dry Neuro: Alert and oriented X 3. CN III-XII intact Grossly normal sensory and motor function . Psych:  Responds to questions appropriately with a normal affect.      Labs: Cardiac Enzymes No results for input(s): CKTOTAL, CKMB, TROPONINI in the last 72 hours. CBC Lab Results  Component Value Date   WBC 10.0 12/24/2020   HGB 14.8 12/24/2020   HCT 43.1 12/24/2020   MCV 91.7 12/24/2020   PLT 204 12/24/2020   PROTIME: No results for input(s): LABPROT, INR in the last 72 hours. Chemistry  Recent Labs  Lab 12/22/20 1216 12/23/20 0541 12/24/20 0540  NA 136   < > 138  K 3.9   < > 4.2  CL 105   < > 102  CO2 25   < > 28  BUN 28*   < > 30*  CREATININE 1.30*   < > 1.40*  CALCIUM 8.8*   < > 9.5  PROT 6.9  --   --   BILITOT 0.9  --   --   ALKPHOS 43  --   --   ALT 52*  --   --   AST 139*  --   --   GLUCOSE 112*   < > 96    < > = values in this interval not displayed.   Lipids Lab Results  Component Value Date   CHOL 215 (H) 12/21/2020   HDL 27 (L) 12/21/2020   LDLCALC 134 (H) 12/21/2020   TRIG 270 (H) 12/21/2020   BNP No results found for: PROBNP Thyroid Function Tests: No results for input(s): TSH, T4TOTAL, T3FREE, THYROIDAB in the last 72 hours.  Invalid input(s): FREET3    Miscellaneous No results found for: DDIMER  Radiology/Studies:  DG Chest 1 View  Result Date: 12/22/2020 CLINICAL DATA:  Encounter for  central line placement EXAM: CHEST  1 VIEW COMPARISON:  Earlier today FINDINGS: New left IJ line with tip near the SVC origin. No pneumothorax or new mediastinal widening. Enteric and endotracheal tubes in good position. Stable pulmonary inflation and interstitial coarsening. Normal heart size. IMPRESSION: New central line and enteric tube without complicating feature. Stable aeration. Electronically Signed   By: Monte Fantasia M.D.   On: 12/22/2020 05:04   DG Chest 1 View  Result Date: 12/22/2020 CLINICAL DATA:  OG tube placement, post code EXAM: CHEST  1 VIEW COMPARISON:  None. FINDINGS: Endotracheal tube has been placed with the tip 2 cm above the carina. No OG tube is visualized. Heart is normal size. No confluent airspace opacity, effusion or pneumothorax. No acute bony abnormality. IMPRESSION: Endotracheal tube 2 cm above the carina.  No visible OG tube. No acute cardiopulmonary disease. Electronically Signed   By: Rolm Baptise M.D.   On: 12/22/2020 02:47   CARDIAC CATHETERIZATION  Result Date: 12/20/2020   Ost RCA to Prox RCA lesion is 70% stenosed.   Prox Cx to Mid Cx lesion is 100% stenosed.   1st Mrg lesion is 60% stenosed.   A drug-eluting stent was successfully placed using a STENT ONYX FRONTIER 3.0X22.   Post intervention, there is a 0% residual stenosis.   There is moderate left ventricular systolic dysfunction.   LV end diastolic pressure is moderately elevated.   The left ventricular  ejection fraction is 35-45% by visual estimate. 1.  Codominant coronary arteries with thrombotic occlusion of the proximal left circumflex which is the culprit for inferior STEMI.  There is also 70% stenosis in ostial right coronary artery which has high anterior takeoff.  Catheter induced spasm in the ostial right coronary artery cannot be completely excluded. 2.  Moderately reduced LV systolic function with segmental wall motion abnormalities in the left circumflex distribution.  Moderately elevated left ventricular end-diastolic pressure at 22 mmHg. 3.  Successful angioplasty and drug-eluting stent placement to the left circumflex. Recommendations: Dual antiplatelet therapy for at least 12 months. Aggressive treatment of risk factors and smoking cessation. The patient had atrial fibrillation on presentation but converted sinus rhythm at the end of the case with amiodarone drip.  Continue amiodarone drip for now and monitor rhythm closely.   DG Chest Port 1 View  Result Date: 12/23/2020 CLINICAL DATA:  65 year old male with respiratory failure. EXAM: PORTABLE CHEST 1 VIEW COMPARISON:  Portable chest 12/22/2020 and earlier. FINDINGS: Portable AP upright view at 0548 hours. Extubated. Enteric tube removed. Stable left IJ central line. Mildly lower lung volumes. Platelike curvilinear opacity at the left lung base is new. Streaky right lung base opacity has mildly increased. Mediastinal contours remain normal. No pneumothorax, pulmonary edema, pleural effusion. No acute osseous abnormality identified. Negative visible bowel gas. IMPRESSION: 1. Extubated and enteric tube removed.  Stable left IJ central line. 2. Lower lung volumes with increased bibasilar opacity most resembling atelectasis. Electronically Signed   By: Genevie Ann M.D.   On: 12/23/2020 06:18   DG Chest Port 1 View  Result Date: 12/21/2020 CLINICAL DATA:  Respiratory distress EXAM: PORTABLE CHEST 1 VIEW COMPARISON:  None. FINDINGS: Normal heart size.  Normal mediastinal contour. No pneumothorax. No pleural effusion. Lungs appear clear, with no acute consolidative airspace disease and no pulmonary edema. IMPRESSION: No active disease. Electronically Signed   By: Ilona Sorrel M.D.   On: 12/21/2020 07:10   ECHOCARDIOGRAM COMPLETE  Result Date: 12/21/2020    ECHOCARDIOGRAM REPORT  Patient Name:   TIMATHY MIXELL [facility] Date of Exam: 12/21/2020 Medical Rec #:  [id number]    Height:       73.0 in Accession #:    [id number]   Weight:       206.3 lb Date of Birth:  21-Jun-1955    BSA:          2.180 m Patient Age:    89 years     BP:           126/75 mmHg Patient Gender: M            HR:           62 bpm. Exam Location:  [facility] Procedure: 2D Echo, 3D Echo and Strain Analysis Indications:     AMI I21.9  History:         Patient has no prior history of Echocardiogram examinations.  Sonographer:     Kathlen Brunswick RDCS Referring Phys:  [id number] A ARIDA Diagnosing Phys: Kate Sable MD  Sonographer Comments: Global longitudinal strain was attempted. IMPRESSIONS  1. Left ventricular ejection fraction, by estimation, is 50%. The left ventricle has low normal function. The left ventricle demonstrates regional wall motion abnormalities (see scoring diagram/findings for description). There is mild left ventricular hypertrophy. Left ventricular diastolic parameters are consistent with Grade II diastolic dysfunction (pseudonormalization). There is moderate hypokinesis of the left ventricular, entire inferolateral wall.  2. Right ventricular systolic function is normal. The right ventricular size is normal.  3. The mitral valve is normal in structure. No evidence of mitral valve regurgitation.  4. The aortic valve is tricuspid. Aortic valve regurgitation is not visualized. FINDINGS  Left Ventricle: Left ventricular ejection fraction, by estimation, is 50%. The left ventricle has low normal function. The left ventricle demonstrates regional wall motion abnormalities. Moderate  hypokinesis of the left ventricular, entire inferolateral  wall. Global longitudinal strain performed but not reported based on interpreter judgement due to suboptimal tracking. 3D left ventricular ejection fraction analysis performed but not reported based on interpreter judgement due to suboptimal quality. The left ventricular internal cavity size was normal in size. There is mild left ventricular hypertrophy. Left ventricular diastolic parameters are consistent with Grade II diastolic dysfunction (pseudonormalization). Right Ventricle: The right ventricular size is normal. No increase in right ventricular wall thickness. Right ventricular systolic function is normal. Left Atrium: Left atrial size was normal in size. Right Atrium: Right atrial size was normal in size. Pericardium: There is no evidence of pericardial effusion. Mitral Valve: The mitral valve is normal in structure. No evidence of mitral valve regurgitation. Tricuspid Valve: The tricuspid valve is normal in structure. Tricuspid valve regurgitation is not demonstrated. Aortic Valve: The aortic valve is tricuspid. Aortic valve regurgitation is not visualized. Aortic valve peak gradient measures 5.7 mmHg. Pulmonic Valve: The pulmonic valve was not well visualized. Pulmonic valve regurgitation is not visualized. Aorta: The aortic root is normal in size and structure. Venous: The inferior vena cava was not well visualized. IAS/Shunts: No atrial level shunt detected by color flow Doppler.  LEFT VENTRICLE PLAX 2D LVIDd:         4.90 cm      Diastology LVIDs:         3.40 cm      LV e' medial:    5.66 cm/s LV PW:         1.10 cm      LV E/e' medial:  12.9 LV IVS:  1.30 cm      LV e' lateral:   4.35 cm/s LVOT diam:     2.00 cm      LV E/e' lateral: 16.7 LV SV:         40 LV SV Index:   18 LVOT Area:     3.14 cm                              3D Volume EF: LV Volumes (MOD)            3D EF:        55 % LV vol d, MOD A2C: 139.0 ml LV EDV:       246 ml LV  vol s, MOD A2C: 75.4 ml  LV ESV:       110 ml LV SV MOD A2C:     63.6 ml  LV SV:        135 ml RIGHT VENTRICLE RV Basal diam:  2.80 cm RV S prime:     13.10 cm/s TAPSE (M-mode): 1.9 cm LEFT ATRIUM             Index       RIGHT ATRIUM           Index LA diam:        2.90 cm 1.33 cm/m  RA Area:     10.90 cm LA Vol (A2C):   46.7 ml 21.42 ml/m RA Volume:   26.00 ml  11.92 ml/m LA Vol (A4C):   37.2 ml 17.06 ml/m LA Biplane Vol: 41.9 ml 19.22 ml/m  AORTIC VALVE AV Area (Vmax): 1.93 cm AV Vmax:        119.00 cm/s AV Peak Grad:   5.7 mmHg LVOT Vmax:      73.10 cm/s LVOT Vmean:     43.500 cm/s LVOT VTI:       0.126 m  AORTA Ao Root diam: 3.30 cm MITRAL VALVE MV Area (PHT): 2.60 cm    SHUNTS MV Decel Time: 292 msec    Systemic VTI:  0.13 m MV E velocity: 72.80 cm/s  Systemic Diam: 2.00 cm MV A velocity: 53.10 cm/s MV E/A ratio:  1.37 Kate Sable MD Electronically signed by Kate Sable MD Signature Date/Time: 12/21/2020/10:34:11 AM    Final    ECHOCARDIOGRAM LIMITED  Result Date: 12/22/2020    ECHOCARDIOGRAM LIMITED REPORT   Patient Name:   MASSI SHUTE [facility] Date of Exam: 12/22/2020 Medical Rec #:  [id number]    Height:       73.0 in Accession #:    [id number]   Weight:       206.3 lb Date of Birth:  [birth year]    BSA:          2.180 m Patient Age:    54 years     BP:           132/66 mmHg Patient Gender: M            HR:           69 bpm. Exam Location:  [facility] Procedure: Limited Echo Indications:     Cardiac Arrest I46.9  History:         Patient has prior history of Echocardiogram examinations, most                  recent 12/21/2020.  Sonographer:     Kathlen Brunswick RDCS Referring Phys:  [id number] BRIAN AGBOR-ETANG Diagnosing Phys: Ida Rogue MD IMPRESSIONS  1. Limited tudy  2. Left ventricular ejection fraction, by estimation, is 45 to 50%. The left ventricle has mildly decreased function. The left ventricle demonstrates regional wall motion abnormalities (Hypokinesis of the inferolateral wall). There is mild  left ventricular hypertrophy.  3. Right ventricular systolic function is normal. The right ventricular size is normal. Tricuspid regurgitation signal is inadequate for assessing PA pressure. FINDINGS  Left Ventricle: Left ventricular ejection fraction, by estimation, is 45 to 50%. The left ventricle has mildly decreased function. The left ventricle demonstrates regional wall motion abnormalities. The left ventricular internal cavity size was normal in size. There is mild left ventricular hypertrophy. Right Ventricle: The right ventricular size is normal. No increase in right ventricular wall thickness. Right ventricular systolic function is normal. Tricuspid regurgitation signal is inadequate for assessing PA pressure. Left Atrium: Left atrial size was normal in size. Right Atrium: Right atrial size was normal in size. Pericardium: There is no evidence of pericardial effusion. Mitral Valve: The mitral valve is normal in structure. No evidence of mitral valve stenosis. Tricuspid Valve: The tricuspid valve is normal in structure. Tricuspid valve regurgitation is not demonstrated. No evidence of tricuspid stenosis. Aortic Valve: The aortic valve was not well visualized. Aortic valve regurgitation is not visualized. No aortic stenosis is present. Pulmonic Valve: The pulmonic valve was normal in structure. Pulmonic valve regurgitation is not visualized. No evidence of pulmonic stenosis. Aorta: The aortic root is normal in size and structure. Venous: The inferior vena cava is normal in size with greater than 50% respiratory variability, suggesting right atrial pressure of 3 mmHg. IAS/Shunts: No atrial level shunt detected by color flow Doppler. 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  The differential diagnosis would include amiodarone which does but only infrequently cause torsade as well as ischemia.  Aggressive therapies directed at the latter are indicated.  In the event that it was amiodarone, it was likely related to blockade of the IKr receptor and as such would be at risk for other IKr  blockers and as such I have told him that he should avoid such drugs and have given him the name for the [employer]/ and QT drugs.org website and to avoid any drugs on these lists.  As relates to his atrial palpitations, they are relatively brief most of the time.  With his coronary disease he is not a candidate for 1C; with his QT prolongation is not a candidate for a class III.  Hence, the next step for him would be catheter ablation.  He has frequent PACs and this was in fact the initial population subjected to PVI.  We will continue low-dose beta-blockers and can use as needed beta-blockers as necessary    Virl Axe

## 2020-12-24 NOTE — TOC Initial Note (Signed)
Transition of Care [facility]) - Initial/Assessment Note    Patient Details  Name: Darryl Gilbert MRN: [id number] Date of Birth: [birth year]  Transition of [facility] CM/SW Contact:    Kerin Salen, RN Phone Number: 12/24/2020, 11:20 AM  Clinical Narrative:  Spoke with patient, who is alert and oriented x4, lives with his Fiance, independent with ADL's. Drives and able to attend PCP appointments. Use [employer] or [employer]. No HH services used in the past, no use of assisted devices. Independent with cooking and shopping. No TOC needs assessed at this time, will continue to track for discharge needs.                 Expected Discharge Plan: Home/Self Care Barriers to Discharge: Continued Medical Work up   Patient Goals and CMS Choice     Choice offered to / list presented to : NA  Expected Discharge Plan and Services Expected Discharge Plan: Home/Self Care     Post Acute Care Choice: NA Living arrangements for the past 2 months: Mobile Home                                      Prior Living Arrangements/Services Living arrangements for the past 2 months: Mobile Home Lives with:: Significant Other Patient language and need for interpreter reviewed:: Yes Do you feel safe going back to the place where you live?: Yes      Need for Family Participation in Patient Care: No (Comment) Care giver support system in place?: Yes (comment)   Criminal Activity/Legal Involvement Pertinent to Current Situation/Hospitalization: No - Comment as needed  Activities of Daily Living Home Assistive Devices/Equipment: None ADL Screening (condition at time of admission) Patient's cognitive ability adequate to safely complete daily activities?: No Is the patient deaf or have difficulty hearing?: No Does the patient have difficulty seeing, even when wearing glasses/contacts?: No Does the patient have difficulty concentrating, remembering, or making decisions?: No Patient able to  express need for assistance with ADLs?: No Does the patient have difficulty dressing or bathing?: No Independently performs ADLs?: Yes (appropriate for developmental age) Does the patient have difficulty walking or climbing stairs?: No Weakness of Legs: None Weakness of Arms/Hands: None  Permission Sought/Granted                  Emotional Assessment Appearance:: Appears stated age Attitude/Demeanor/Rapport: Engaged Affect (typically observed): Accepting, Appropriate Orientation: : Oriented to Self, Oriented to Place, Oriented to  Time, Oriented to Situation Alcohol / Substance Use: Not Applicable Psych Involvement: No (comment)  Admission diagnosis:  Other chest pain [R07.89] ST elevation myocardial infarction (STEMI) involving other coronary artery of inferior wall (HCC) [I21.19] STEMI (ST elevation myocardial infarction) ([place]) [I21.3] Acute ST elevation myocardial infarction (STEMI) of inferior wall (HCC) [I21.19] Patient Active Problem List   Diagnosis Date Noted   Smoking    STEMI (ST elevation myocardial infarction) ([place]) 12/20/2020   Acute ST elevation myocardial infarction (STEMI) of inferior wall ([place]) 12/20/2020   PCP:  Patient, No Pcp Per (Inactive) Pharmacy:   [facility] [place], [place] AT [facility] [address] [place] [address] Phone: [phone] Fax: [phone]     Social Determinants of Health (SDOH) Interventions    Readmission Risk Interventions No flowsheet data found.

## 2020-12-24 NOTE — Progress Notes (Addendum)
Progress Note  Patient Name: Darryl Gilbert Date of Encounter: 12/24/2020  [facility] Cardiologist: Dr. [name]  Subjective   Denies chest pain, heart rhythm has been irregular throughout the morning.  Inpatient Medications    Scheduled Meds:  aspirin  81 mg Oral Daily   atorvastatin  80 mg Oral Daily   Chlorhexidine Gluconate Cloth  6 each Topical Q0600   docusate  100 mg Oral BID   enoxaparin (LOVENOX) injection  40 mg Subcutaneous Q24H   losartan  25 mg Oral Daily   metoprolol tartrate  25 mg Oral BID   polyethylene glycol  17 g Per Tube Daily   prasugrel  10 mg Oral Daily   sodium chloride flush  3 mL Intravenous Q12H   Continuous Infusions:  sodium chloride 10 mL/hr at 12/22/20 0614   PRN Meds: sodium chloride, acetaminophen, cyclobenzaprine, midazolam, oxyCODONE-acetaminophen, sodium chloride flush   Vital Signs    Vitals:   12/24/20 1000 12/24/20 1100 12/24/20 1200 12/24/20 1300  BP: 136/73 113/79 98/80 112/71  Pulse: (!) 119 94 61 (!) 53  Resp: 20 (!) 21 (!) 24 (!) 26  Temp:      TempSrc:      SpO2: (!) 80% 99% 97% 98%  Weight:      Height:        Intake/Output Summary (Last 24 hours) at 12/24/2020 1525 Last data filed at 12/24/2020 0549 Gross per 24 hour  Intake 349.31 ml  Output 1800 ml  Net -1450.69 ml   Last 3 Weights 12/20/2020 12/20/2020  Weight (lbs) 206 lb 5.6 oz 212 lb  Weight (kg) 93.6 kg 96.163 kg      Telemetry    Sinus rhythm, SVTs,- Personally Reviewed  ECG     - Personally Reviewed  Physical Exam   GEN: No acute distress.   Neck: No JVD Cardiac: RRR, no murmurs, rubs, or gallops.  Respiratory: Clear to auscultation bilaterally. GI: Soft, nontender, non-distended  MS: No edema; No deformity. Neuro:  Nonfocal  Psych: Normal affect   Labs    High Sensitivity Troponin:   Recent Labs  Lab 12/20/20 1448 12/20/20 1730 12/20/20 1939 12/20/20 2128  TROPONINIHS 19* 4,521* >24,000* >24,000*      Chemistry Recent Labs  Lab  12/20/20 1448 12/21/20 0543 12/22/20 0222 12/22/20 1216 12/23/20 0541 12/24/20 0540  NA 135 135   < > 136 136 138  K 3.9 3.9   < > 3.9 3.7 4.2  CL 102 104   < > 105 100 102  CO2 23 23   < > '25 27 28  '$ GLUCOSE 139* 102*   < > 112* 99 96  BUN 22 26*   < > 28* 32* 30*  CREATININE 1.63* 1.33*   < > 1.30* 1.27* 1.40*  CALCIUM 9.6 9.1   < > 8.8* 9.1 9.5  PROT 7.5 6.8  --  6.9  --   --   ALBUMIN 4.1 3.7  --  3.5  --   --   AST 30 318*  --  139*  --   --   ALT 22 69*  --  52*  --   --   ALKPHOS 49 40  --  43  --   --   BILITOT 1.0 0.6  --  0.9  --   --   GFRNONAA 47* 60*   < > >60 >60 56*  ANIONGAP 10 8   < > '6 9 8   '$ < > =  values in this interval not displayed.     Hematology Recent Labs  Lab 12/22/20 0222 12/23/20 0541 12/24/20 0540  WBC 13.7* 10.4 10.0  RBC 4.65 4.30 4.70  HGB 15.4 13.8 14.8  HCT 43.5 39.9 43.1  MCV 93.5 92.8 91.7  MCH 33.1 32.1 31.5  MCHC 35.4 34.6 34.3  RDW 13.6 13.3 13.0  PLT 245 179 204    BNPNo results for input(s): BNP, PROBNP in the last 168 hours.   DDimer No results for input(s): DDIMER in the last 168 hours.   Radiology    DG Chest Port 1 View  Result Date: 12/23/2020 CLINICAL DATA:  65 year old male with respiratory failure. EXAM: PORTABLE CHEST 1 VIEW COMPARISON:  Portable chest 12/22/2020 and earlier. FINDINGS: Portable AP upright view at 0548 hours. Extubated. Enteric tube removed. Stable left IJ central line. Mildly lower lung volumes. Platelike curvilinear opacity at the left lung base is new. Streaky right lung base opacity has mildly increased. Mediastinal contours remain normal. No pneumothorax, pulmonary edema, pleural effusion. No acute osseous abnormality identified. Negative visible bowel gas. IMPRESSION: 1. Extubated and enteric tube removed.  Stable left IJ central line. 2. Lower lung volumes with increased bibasilar opacity most resembling atelectasis. Electronically Signed   By: Genevie Ann M.D.   On: 12/23/2020 06:18    Cardiac  Studies   TTE 12/21/2020 1. Left ventricular ejection fraction, by estimation, is 50%. The left  ventricle has low normal function. The left ventricle demonstrates  regional wall motion abnormalities (see scoring diagram/findings for  description). There is mild left ventricular  hypertrophy. Left ventricular diastolic parameters are consistent with  Grade II diastolic dysfunction (pseudonormalization). There is moderate  hypokinesis of the left ventricular, entire inferolateral wall.   2. Right ventricular systolic function is normal. The right ventricular  size is normal.   3. The mitral valve is normal in structure. No evidence of mitral valve  regurgitation.   4. The aortic valve is tricuspid. Aortic valve regurgitation is not  visualized.   Patient Profile     65 y.o. male with history of hypertension, tobacco use presenting with chest pain, diagnosed with inferior MI underwent drug-eluting stent to the left circumflex.  Hospital course complicated by QT prolongation and torsades in the setting of hypokalemia, amiodarone use.  Assessment & Plan    Inferior STEMI -S/p DES to left circumflex -Denies chest pain -Continue aspirin, Effient, Lipitor.  2.  Transient A. fib, SVT. -Lopressor 25 mg twice daily -EP input hopefully tomorrow -Will not anticoagulate at this time since A. fib occurred in the setting of acute MI.  3. Torsades dP -Avoid QT prolonging medications -Replete electrolytes -EP input as above.   Total encounter time 35 minutes  Greater than 50% was spent in counseling and coordination of care with the patient   Addendum Discussed with EP SK, will start lidocaine drip at 1 mg/min for now.     Signed, Kate Sable, MD  12/24/2020, 3:25 PM

## 2020-12-24 NOTE — Progress Notes (Signed)
PT Cancellation Note  Patient Details Name: CRISTOFHER WEITKAMP MRN: [id number] DOB: 1955/11/25   Cancelled Treatment:    Reason Eval/Treat Not Completed: Medical issues which prohibited therapy. Order received, pt chart reviewed. RN observed a-fib this morning, ECG this morning revealed PVC. HR ranging from 70's-150's - per orders, target HR is 65-105. With pt history of ventricular tachycardia, PT will hold at this time. RN asked PT to follow up this afternoon. Will continue to follow and attempt evaluation as medically appropriate.    Patrina Levering PT, DPT 12/24/20 9:47 AM [phone]

## 2020-12-25 ENCOUNTER — Encounter: Payer: Self-pay | Admitting: Physician Assistant

## 2020-12-25 DIAGNOSIS — Z8679 Personal history of other diseases of the circulatory system: Secondary | ICD-10-CM

## 2020-12-25 DIAGNOSIS — J9601 Acute respiratory failure with hypoxia: Secondary | ICD-10-CM

## 2020-12-25 DIAGNOSIS — J9602 Acute respiratory failure with hypercapnia: Secondary | ICD-10-CM

## 2020-12-25 LAB — CBC
HCT: 40.8 % (ref 39.0–52.0)
Hemoglobin: 14.3 g/dL (ref 13.0–17.0)
MCH: 32.1 pg (ref 26.0–34.0)
MCHC: 35 g/dL (ref 30.0–36.0)
MCV: 91.7 fL (ref 80.0–100.0)
Platelets: 208 10*3/uL (ref 150–400)
RBC: 4.45 MIL/uL (ref 4.22–5.81)
RDW: 12.9 % (ref 11.5–15.5)
WBC: 9.4 10*3/uL (ref 4.0–10.5)
nRBC: 0 % (ref 0.0–0.2)

## 2020-12-25 LAB — BASIC METABOLIC PANEL
Anion gap: 7 (ref 5–15)
BUN: 31 mg/dL — ABNORMAL HIGH (ref 8–23)
CO2: 28 mmol/L (ref 22–32)
Calcium: 9 mg/dL (ref 8.9–10.3)
Chloride: 100 mmol/L (ref 98–111)
Creatinine, Ser: 1.31 mg/dL — ABNORMAL HIGH (ref 0.61–1.24)
GFR, Estimated: >60 mL/min (ref >60)
Glucose, Bld: 114 mg/dL — ABNORMAL HIGH (ref 70–99)
Potassium: 4.2 mmol/L (ref 3.5–5.1)
Sodium: 135 mmol/L (ref 135–145)

## 2020-12-25 LAB — MAGNESIUM: Magnesium: 2.2 mg/dL (ref 1.7–2.4)

## 2020-12-25 LAB — PHOSPHORUS: Phosphorus: 3.8 mg/dL (ref 2.5–4.6)

## 2020-12-25 MED ORDER — ALUM & MAG HYDROXIDE-SIMETH 200-200-20 MG/5ML PO SUSP
30.0000 mL | ORAL | Status: DC | PRN
  Administered 2020-12-25: 30 mL via ORAL
  Filled 2020-12-25: qty 30

## 2020-12-25 NOTE — Plan of Care (Signed)
Pt followed by cardiac team medications adjusted. Lidocaine infusion stopped in the morning. He continues to have frequent PACs no complaints of sob or palpitations. Darryl Gilbert worked with PT and was able to sit in chair for a couple hours. Percocet was given x1 for chronic back pain.  Neuro intact (left pupil slightly larger than R both reactive), RA 2L of O2 when asleep. Adequate appetite and fluid intake. Foley cath and central line discontinued. First void post foley cath removal wnl ~ 525 out. 1 PIV intact R arm. No skin concerns. Fall precautions in place. Call bell within reach. Written education material re: new medications, heart healthy diet and resources for quitting smoking were provided to pt.   Problem: Education: Goal: Knowledge of General Education information will improve Description: Including pain rating scale, medication(s)/side effects and non-pharmacologic comfort measures Outcome: Progressing   Problem: Health Behavior/Discharge Planning: Goal: Ability to manage health-related needs will improve Outcome: Progressing   Problem: Clinical Measurements: Goal: Ability to maintain clinical measurements within normal limits will improve Outcome: Progressing Goal: Will remain free from infection Outcome: Progressing Goal: Diagnostic test results will improve Outcome: Progressing Goal: Respiratory complications will improve Outcome: Progressing Goal: Cardiovascular complication will be avoided Outcome: Progressing   Problem: Activity: Goal: Risk for activity intolerance will decrease Outcome: Progressing   Problem: Nutrition: Goal: Adequate nutrition will be maintained Outcome: Progressing   Problem: Coping: Goal: Level of anxiety will decrease Outcome: Progressing   Problem: Elimination: Goal: Will not experience complications related to bowel motility Outcome: Progressing Goal: Will not experience complications related to urinary retention Outcome: Progressing    Problem: Pain Managment: Goal: General experience of comfort will improve Outcome: Progressing   Problem: Safety: Goal: Ability to remain free from injury will improve Outcome: Progressing   Problem: Skin Integrity: Goal: Risk for impaired skin integrity will decrease Outcome: Progressing   Problem: Cardiovascular: Goal: Ability to achieve and maintain adequate cardiovascular perfusion will improve Outcome: Progressing Goal: Vascular access site(s) Level 0-1 will be maintained Outcome: Progressing

## 2020-12-25 NOTE — Progress Notes (Signed)
NAME:  Darryl Gilbert, MRN:  [id number], DOB:  May 18, 1955, LOS: 5 ADMISSION DATE:  12/20/2020, CONSULTATION DATE:  12/22/2020 REFERRING MD: Noberto Retort MD CHIEF COMPLAINT:  Chest Pain    HPI  65 y.o male with significant PMH of hypertension tobacco use and chronic back pain who presented to the ED with chief complaints of chest pain.  Patient unable to provide history so history mostly obtained from patient's chart.  Per his chart, patient complained of substernal chest pain radiating to his neck and left arm.  He apparently described pain as heavy sensation in his chest rating 10 out of a 10 on a pain intensity rating scale associated with dizziness and shortness of breath EMS was called and patient was found to have significant anterior ST elevation on his EKG hence code STEMI was activated.  He received aspirin and 100 mics of fentanyl but no nitroglycerin due to hypotension.  Patient was transported to the ED for further evaluation.  12/25/20- patient with Afrvr overnight on lidocaine drip.  Reviewed with [facility] attending and cardiology team.   ED Course: On arrival to the ED, he was afebrile with blood pressure 146/116 mm Hg and pulse rate 76 beats/min, respiration 23, oxygen saturation 100% on room air. There were no focal neurological deficits; he was alert and oriented x4.  Initial twelve-lead EKG showed inferior STEMI, sinus rhythm with rates in the 130s.  Patient received IV metoprolol and cardiology consulted in the ED who recommended heparinization and left heart cath emergently.  Patient was taken to Cath Lab emergently.  Patient had PCI to left circumflex.  70% RCA also noted. Echo showed low normal EF 50%.  Inferolateral wall hypokinesis.  In the OR patient went into into brief episode of A. fib after intervention, amiodarone drip was started, patient has maintained sinus rhythm since.  Patient was transferred to the ICU post cath. Initial pertinent labs/Diagnostics WBC/Hgb/Hct/Plts:   9.4/14.3/40.8/208 (09/08 0530)  Glucose 139, BUN 22, creatinine 1.63 Troponin 19>4521>>24,000>>24,000 EKG: Inferior ST elevation MI  Hospital Course: During the course of his recovering in the ICU, patient went into polymorphic ventricular tachycardia that degenerated quickly into ventricular fibrillation.  CODE BLUE was initiated and patient required 3 rounds of defibrillation following administration of 2 mg of Versed and 50 of fentanyl.  Twelve-lead EKG was obtained and showed no significant ST elevation but was noted with prolonged QT interval greater than 550 ms consistent with torsades de points.  Cardiology was started 5 who came to the bedside and recommended discontinuation of amiodarone drip.  Patient was ready received sodium bicarb calcium gluconate and 2 mg of IV magnesium.  Patient then went into SVT and received 5 mg of IV metoprolol per cardiology with conversion to sinus rhythm and gradual shortening of QT interval.  PCCM consulted for further management.  Past Medical History  Hypertension Back pain  Significant Hospital Events   9/3: Patient presenting with Inferior lateral STEMI  status post DES placement to the proximal left circumflex artery 9/4: Patient went into polymorphic ventricular tachycardia s/p defibrillation.  Intubated.  PCCM consulted  Consults:  Cardiology PCCM  Procedures:  9/3: Cardiac cath 9/4: Left IJ placement 9/4: Left femoral art line 9/4: Mechanical intubation  Significant Diagnostic Tests:  9/3: Chest Xray> no active cardiopulmonary process  Micro Data:  9/3: SARS-CoV-2 PCR> negative 9/3: Influenza PCR> negative  Antimicrobials:  None required   OBJECTIVE  Blood pressure (!) 115/93, pulse 79, temperature (!) 97.5 F (36.4 C), temperature  source Oral, resp. rate (!) 22, height 6' 0.99" (1.854 m), weight 93.6 kg, SpO2 94 %.        Intake/Output Summary (Last 24 hours) at 12/25/2020 1010 Last data filed at 12/25/2020 0855 Gross per 24  hour  Intake 1456.85 ml  Output 3100 ml  Net -1643.15 ml    Filed Weights   12/20/20 1447 12/20/20 1641  Weight: 96.2 kg 93.6 kg   Physical Examination  GENERAL: Age appropriate NAD  EYES: Pupils equal, round, reactive to light and accommodation. No scleral icterus. Extraocular muscles intact.  HEENT: Head atraumatic, normocephalic. Oropharynx and nasopharynx clear.  NECK:  Supple, no jugular venous distention. No thyroid enlargement, no tenderness.  LUNGS: Normal breath sounds bilaterally, no wheezing, rales,rhonchi or crepitation. No use of accessory muscles of respiration.  CARDIOVASCULAR: S1, S2 normal. No murmurs, rubs, or gallops.  ABDOMEN: Soft, nontender, nondistended. Bowel sounds present. No organomegaly or mass.  EXTREMITIES: No pedal edema, cyanosis, or clubbing.  NEUROLOGIC: Cranial nerves II through XII are intact.  Muscle strength not assessed.  Sensation intact. Gait not checked.  PSYCHIATRIC: The patient is intubated and sedated SKIN: No obvious rash, lesion, or ulcer.   Labs/imaging that I havepersonally reviewed  (right click and "Reselect all SmartList Selections" daily)     Labs   CBC: Recent Labs  Lab 12/20/20 1448 12/21/20 0543 12/22/20 0222 12/23/20 0541 12/24/20 0540 12/25/20 0530  WBC 9.9 10.6* 13.7* 10.4 10.0 9.4  NEUTROABS 6.1  --   --   --   --   --   HGB 15.2 13.8 15.4 13.8 14.8 14.3  HCT 42.9 39.9 43.5 39.9 43.1 40.8  MCV 91.5 91.3 93.5 92.8 91.7 91.7  PLT 250 185 245 179 204 208     Basic Metabolic Panel: Recent Labs  Lab 12/20/20 1448 12/21/20 0543 12/22/20 0222 12/22/20 1216 12/23/20 0541 12/24/20 0540 12/25/20 0530  NA 135   < > 136 136 136 138 135  K 3.9   < > 3.5 3.9 3.7 4.2 4.2  CL 102   < > 101 105 100 102 100  CO2 23   < > '26 25 27 28 28  '$ GLUCOSE 139*   < > 140* 112* 99 96 114*  BUN 22   < > 28* 28* 32* 30* 31*  CREATININE 1.63*   < > 1.37* 1.30* 1.27* 1.40* 1.31*  CALCIUM 9.6   < > 10.8* 8.8* 9.1 9.5 9.0  MG 1.7   --  2.2  --  2.2 2.3 2.2  PHOS  --   --   --   --  3.6 3.9 3.8   < > = values in this interval not displayed.    GFR: Estimated Creatinine Clearance: 64.4 mL/min (A) (by C-G formula based on SCr of 1.31 mg/dL (H)). Recent Labs  Lab 12/22/20 0222 12/23/20 0541 12/24/20 0540 12/25/20 0530  WBC 13.7* 10.4 10.0 9.4     Liver Function Tests: Recent Labs  Lab 12/20/20 1448 12/21/20 0543 12/22/20 1216  AST 30 318* 139*  ALT 22 69* 52*  ALKPHOS 49 40 43  BILITOT 1.0 0.6 0.9  PROT 7.5 6.8 6.9  ALBUMIN 4.1 3.7 3.5    No results for input(s): LIPASE, AMYLASE in the last 168 hours. No results for input(s): AMMONIA in the last 168 hours.  ABG    Component Value Date/Time   PHART 7.40 12/22/2020 0251   PCO2ART 45 12/22/2020 0251   PO2ART 388 (H) 12/22/2020 0251  HCO3 27.9 12/22/2020 0251   O2SAT 100.0 12/22/2020 0251     Coagulation Profile: Recent Labs  Lab 12/20/20 1448  INR 1.0     Cardiac Enzymes: No results for input(s): CKTOTAL, CKMB, CKMBINDEX, TROPONINI in the last 168 hours.  HbA1C: Hgb A1c MFr Bld  Date/Time Value Ref Range Status  12/21/2020 05:43 AM 6.0 (H) 4.8 - 5.6 % Final    Comment:    (NOTE)         Prediabetes: 5.7 - 6.4         Diabetes: >6.4         Glycemic control for adults with diabetes: <7.0     CBG: Recent Labs  Lab 12/20/20 1642  GLUCAP 115*     Review of Systems:   UNABLE TO OBTAIN PATIENT IS INTUBATED AD SEDATED  Past Medical History  He,  has a past medical history of Back pain and Hypertension.   Surgical History    Past Surgical History:  Procedure Laterality Date   CORONARY/GRAFT ACUTE MI REVASCULARIZATION N/A 12/20/2020   Procedure: Coronary/Graft Acute MI Revascularization;  Surgeon: Wellington Hampshire, MD;  Location: [facility];  Service: Cardiovascular;  Laterality: N/A;   LEFT HEART CATH AND CORONARY ANGIOGRAPHY N/A 12/20/2020   Procedure: LEFT HEART CATH AND CORONARY ANGIOGRAPHY;  Surgeon: Wellington Hampshire, MD;  Location: [facility];  Service: Cardiovascular;  Laterality: N/A;     Social History   reports that he has been smoking. He has never used smokeless tobacco.   Family History   His family history is not on file.   Allergies No Known Allergies   Home Medications  Prior to Admission medications   Medication Sig Start Date End Date Taking? Authorizing Provider  cyclobenzaprine (FLEXERIL) 10 MG tablet Take 1 tablet (10 mg total) by mouth 3 (three) times daily as needed. 12/12/17   Sable Feil, PA-C  losartan (COZAAR) 100 MG tablet Take 100 mg by mouth daily.    [provider]  meloxicam (MOBIC) 15 MG tablet Take 1 tablet (15 mg total) by mouth daily. 12/12/17   Sable Feil, PA-C  olmesartan (BENICAR) 20 MG tablet Take 20 mg by mouth daily.    [provider]  oxyCODONE-acetaminophen (PERCOCET) 7.5-325 MG tablet Take 1 tablet by mouth every 6 (six) hours as needed. 12/12/17   Sable Feil, PA-C    Scheduled Meds:  aspirin  81 mg Oral Daily   atorvastatin  80 mg Oral Daily   Chlorhexidine Gluconate Cloth  6 each Topical Q0600   docusate  100 mg Oral BID   enoxaparin (LOVENOX) injection  40 mg Subcutaneous Q24H   losartan  25 mg Oral Daily   metoprolol tartrate  25 mg Oral BID   polyethylene glycol  17 g Per Tube Daily   prasugrel  10 mg Oral Daily   sodium chloride flush  3 mL Intravenous Q12H   Continuous Infusions:  sodium chloride 10 mL/hr at 12/25/20 0522   PRN Meds:.sodium chloride, acetaminophen, cyclobenzaprine, midazolam, oxyCODONE-acetaminophen, sodium chloride flush  Assessment & Plan:  Acute hypoxic respiratory failure in the setting of cardiac dysrhythmia  PMHx:presumed OSA, tobacco abuse -Ventilator settings: PRVC 8 mL/kg, 90 % FiO2, 5 PEEP -Wean PEEP and FiO2 for sats greater than 90% -Plateau pressures less than 30 cm H20 -VAP bundle in place -Intermittent chest x-ray & ABG -WUA/ SBT as appropriate -Ensure  adequate pulmonary hygiene  -Nicotine patch ordered -Budesonide nebs  BID, bronchodilators PRN -PAD protocol in place: continue Fentanyl drip & Versed drip&IVP  Polymorphic ventricular tachycardia/ventricular fibrillation in the setting of prolonged QTc S/p Defibrillation (100-200J biphasic) under sedation  -Serial EKG -Replace electrolytes as needed -Monitor BMP+Ma -Avoid QTC prolongation agents -Metoprolol PRN -Keep NPO  -STOP Amiodarone    Inferior Lateral STEMI status post DES placement to the proximal left circumflex artery, 70% RCA also noted Echo showed low normal EF 50%.  Inferolateral wall hypokinesis -ASA '81mg'$  PO daily +Brilinta -Atorvastatin '80mg'$  PO daily, check lipid panel  -Continue metoprolol (Hold if SBP<90 / cardiogenic shock)  -Cardiology following  Acute systolic heart failure/post MI Cardiomyopathy -Hypertension -Post MI A-fib with RVR -Continuous cardiac monitoring -Levophed to maintain MAP greater than 65 -Continue metoprolol as above -Losartan as BP and renal function permits -TSH, FT4 -Stop amiodarone as above -Cardiology following, appreciate input   Best practice:  Diet:  Tube Feed  Pain/Anxiety/Delirium protocol (if indicated): Yes (RASS goal -1) VAP protocol (if indicated): Yes DVT prophylaxis: LMWH GI prophylaxis: N/A Glucose control:  SSI No Central venous access:  Yes, and it is still needed Arterial line:  Yes, and it is still needed Foley:  Yes, and it is still needed Mobility:  bed rest  PT consulted: N/A Last date of multidisciplinary goals of care discussion [9/5] Code Status:  full code Disposition: ICU   = Goals of Care = Code Status Order: FULL  Primary Emergency ContactHenrene Hawking, Home Phone: [phone] Wishes to pursue full aggressive treatment and intervention options, including CPR and intubation, goals of care will be addressed on going with family if that should become necessary.   Critical care provider  statement:   Total critical care time: 33 minutes   Performed by: Lanney Gins MD   Critical care time was exclusive of separately billable procedures and treating other patients.   Critical care was necessary to treat or prevent imminent or life-threatening deterioration.   Critical care was time spent personally by me on the following activities: development of treatment plan with patient and/or surrogate as well as nursing, discussions with consultants, evaluation of patient's response to treatment, examination of patient, obtaining history from patient or surrogate, ordering and performing treatments and interventions, ordering and review of laboratory studies, ordering and review of radiographic studies, pulse oximetry and re-evaluation of patient's condition.    Ottie Glazier, M.D.  Pulmonary & Critical Care Medicine     .

## 2020-12-25 NOTE — Progress Notes (Signed)
Patient ID: Darryl Gilbert, male   DOB: [birth year], 65 y.o.   MRN: [id number]  PROGRESS NOTE    Darryl Gilbert  [id number] DOB: [birth year] DOA: 12/20/2020 PCP: Patient, No Pcp Per (Inactive)   Brief Narrative:  65 year old male with history of hypertension, chronic back pain, tobacco use presented with chest pain and was found to have inferior lateral STEMI.  Cardiology was consulted; he underwent cardiac cath and DES to left circumflex.  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  He had episodes of A. fib for which amiodarone was started.  He was transferred to ICU postcardiac catheterization.  Subsequently he had polymorphic VT and V. fib requiring defibrillation; amiodarone drip was discontinued.  He was subsequently intubated on 12/21/2020.  He was extubated on 12/22/2020.  He was transferred back to [facility] service on 12/24/2020.  Assessment & Plan:   Acute respiratory failure with hypoxia in the setting of cardiac dysrhythmia -Patient required intubation on 12/21/2020 after polymorphic VT and V. fib requiring defibrillation -Extubated on 12/22/2020 -Currently still on 2 L oxygen via nasal cannula.  Wean off as able.  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  Continue aspirin, prasugrel, statin and metoprolol  Acute systolic heart failure due to ischemic cardiomyopathy -Echo showed EF of 45 to 50%.  Patient received 1 dose of IV Lasix on 12/23/2020.  Further diuretic dosing as per cardiology. -Strict input and output.  Daily weights.  Fluid restriction.  Continue metoprolol and losartan  Torsades de Pointes Polymorphic ventricular tachycardia/V. fib in the setting of prolonged QTC -Status post defibrillation.  Off amiodarone -Avoid QT prolonging medications. -Maintain potassium above 4 and magnesium above  2 -Currently on lidocaine drip.  Follow cardiology recommendations  Paroxysmal A. fib -Currently intermittently bradycardic.  Continue metoprolol.  Anticoagulation not started by cardiology in the setting of ongoing DAPT  Hypertension -Blood pressure stable.  Continue metoprolol and losartan  Hyperlipidemia  -continue Lipitor  Chronic tobacco use -Cessation advised by prior hospitalist  Leukocytosis -Resolved  DVT prophylaxis: Lovenox Code Status: Full Family Communication: None at bedside Disposition Plan: Status is: Inpatient  Remains inpatient appropriate because:Inpatient level of care appropriate due to severity of illness  Dispo: The patient is from: Home              Anticipated d/c is to: Home              Patient currently is not medically stable to d/c.   Difficult to place patient No   Consultants: PCCM/cardiology  Procedures: Cardiac catheterization/echo/intubations and extubation  Antimicrobials:  None  Subjective: Patient seen and examined at bedside.  No fever, vomiting, abdominal pain or worsening shortness of breath reported.  Objective: Vitals:   12/25/20 0300 12/25/20 0400 12/25/20 0500 12/25/20 0600  BP: (!) 99/59 107/64 107/64 115/66  Pulse: (!) 58 (!) 48 (!) 57 (!) 53  Resp: '13 19 14 16  '$ Temp:      TempSrc:      SpO2: 91% 93% 96% 98%  Weight:      Height:        Intake/Output Summary (Last 24 hours) at 12/25/2020 0824 Last data filed at 12/25/2020 0554 Gross per 24 hour  Intake 1408.3 ml  Output 2750 ml  Net -1341.7 ml    Filed Weights   12/20/20 1447 12/20/20 1641  Weight:  96.2 kg 93.6 kg    Examination:  General exam: No distress.  On 2 L oxygen via nasal cannula.  Looks deconditioned Respiratory system: Decreased breath sounds at bases bilaterally with basilar crackles  cardiovascular system: Intermittently bradycardic; S1-S2 heard gastrointestinal system: Abdomen is distended slightly, soft and nontender.  Bowel sounds are  heard  extremities: Trace lower extremity edema; no cyanosis Central nervous system: Awake and alert.  No focal neurological deficits.  Moves extremities Skin: No obvious ecchymosis/lesions Psychiatry: Affect is mostly flat.   Data Reviewed: I have personally reviewed following labs and imaging studies  CBC: Recent Labs  Lab 12/20/20 1448 12/21/20 0543 12/22/20 0222 12/23/20 0541 12/24/20 0540 12/25/20 0530  WBC 9.9 10.6* 13.7* 10.4 10.0 9.4  NEUTROABS 6.1  --   --   --   --   --   HGB 15.2 13.8 15.4 13.8 14.8 14.3  HCT 42.9 39.9 43.5 39.9 43.1 40.8  MCV 91.5 91.3 93.5 92.8 91.7 91.7  PLT 250 185 245 179 204 [id number]    Basic Metabolic Panel: Recent Labs  Lab 12/20/20 1448 12/21/20 0543 12/22/20 0222 12/22/20 1216 12/23/20 0541 12/24/20 0540 12/25/20 0530  NA 135   < > 136 136 136 138 135  K 3.9   < > 3.5 3.9 3.7 4.2 4.2  CL 102   < > 101 105 100 102 100  CO2 23   < > '26 25 27 28 28  '$ GLUCOSE 139*   < > 140* 112* 99 96 114*  BUN 22   < > 28* 28* 32* 30* 31*  CREATININE 1.63*   < > 1.37* 1.30* 1.27* 1.40* 1.31*  CALCIUM 9.6   < > 10.8* 8.8* 9.1 9.5 9.0  MG 1.7  --  2.2  --  2.2 2.3 2.2  PHOS  --   --   --   --  3.6 3.9 3.8   < > = values in this interval not displayed.    GFR: Estimated Creatinine Clearance: 64.4 mL/min (A) (by C-G formula based on SCr of 1.31 mg/dL (H)). Liver Function Tests: Recent Labs  Lab 12/20/20 1448 12/21/20 0543 12/22/20 1216  AST 30 318* 139*  ALT 22 69* 52*  ALKPHOS 49 40 43  BILITOT 1.0 0.6 0.9  PROT 7.5 6.8 6.9  ALBUMIN 4.1 3.7 3.5    No results for input(s): LIPASE, AMYLASE in the last 168 hours. No results for input(s): AMMONIA in the last 168 hours. Coagulation Profile: Recent Labs  Lab 12/20/20 1448  INR 1.0    Cardiac Enzymes: No results for input(s): CKTOTAL, CKMB, CKMBINDEX, TROPONINI in the last 168 hours. BNP (last 3 results) No results for input(s): PROBNP in the last 8760 hours. HbA1C: No results for  input(s): HGBA1C in the last 72 hours. CBG: Recent Labs  Lab 12/20/20 1642  GLUCAP 115*    Lipid Profile: No results for input(s): CHOL, HDL, LDLCALC, TRIG, CHOLHDL, LDLDIRECT in the last 72 hours. Thyroid Function Tests: No results for input(s): TSH, T4TOTAL, FREET4, T3FREE, THYROIDAB in the last 72 hours. Anemia Panel: No results for input(s): VITAMINB12, FOLATE, FERRITIN, TIBC, IRON, RETICCTPCT in the last 72 hours. Sepsis Labs: No results for input(s): PROCALCITON, LATICACIDVEN in the last 168 hours.  Recent Results (from the past 240 hour(s))  Resp Panel by RT-PCR (Flu A&B, Covid) Nasopharyngeal Swab     Status: None   Collection Time: 12/20/20  2:48 PM   Specimen: Nasopharyngeal Swab; Nasopharyngeal(NP) swabs in vial transport medium  Result Value Ref Range Status   SARS Coronavirus 2 by RT PCR NEGATIVE NEGATIVE Final    Comment: (NOTE) SARS-CoV-2 target nucleic acids are NOT DETECTED.  The SARS-CoV-2 RNA is generally detectable in upper respiratory specimens during the acute phase of infection. The lowest concentration of SARS-CoV-2 viral copies this assay can detect is 138 copies/mL. A negative result does not preclude SARS-Cov-2 infection and should not be used as the sole basis for treatment or other patient management decisions. A negative result may occur with  improper specimen collection/handling, submission of specimen other than nasopharyngeal swab, presence of viral mutation(s) within the areas targeted by this assay, and inadequate number of viral copies(<138 copies/mL). A negative result must be combined with clinical observations, patient history, and epidemiological information. The expected result is Negative.  Fact Sheet for Patients:  EntrepreneurPulse.com.au  Fact Sheet for Healthcare Providers:  IncredibleEmployment.be  This test is no t yet approved or cleared by the [place] FDA and  has been authorized  for detection and/or diagnosis of SARS-CoV-2 by FDA under an Emergency Use Authorization (EUA). This EUA will remain  in effect (meaning this test can be used) for the duration of the COVID-19 declaration under Section 564(b)(1) of the Act, 21 U.S.C.section 360bbb-3(b)(1), unless the authorization is terminated  or revoked sooner.       Influenza A by PCR NEGATIVE NEGATIVE Final   Influenza B by PCR NEGATIVE NEGATIVE Final    Comment: (NOTE) The Xpert Xpress SARS-CoV-2/FLU/RSV plus assay is intended as an aid in the diagnosis of influenza from Nasopharyngeal swab specimens and should not be used as a sole basis for treatment. Nasal washings and aspirates are unacceptable for Xpert Xpress SARS-CoV-2/FLU/RSV testing.  Fact Sheet for Patients: EntrepreneurPulse.com.au  Fact Sheet for Healthcare Providers: IncredibleEmployment.be  This test is not yet approved or cleared by the [place] FDA and has been authorized for detection and/or diagnosis of SARS-CoV-2 by FDA under an Emergency Use Authorization (EUA). This EUA will remain in effect (meaning this test can be used) for the duration of the COVID-19 declaration under Section 564(b)(1) of the Act, 21 U.S.C. section 360bbb-3(b)(1), unless the authorization is terminated or revoked.  Performed at [facility], [address] [place], [place] [address]   MRSA Next Gen by PCR, Nasal     Status: None   Collection Time: 12/20/20  4:43 PM   Specimen: Nasal Mucosa; Nasal Swab  Result Value Ref Range Status   MRSA by PCR Next Gen NOT DETECTED NOT DETECTED Final    Comment: (NOTE) The GeneXpert MRSA Assay (FDA approved for NASAL specimens only), is one component of a comprehensive MRSA colonization surveillance program. It is not intended to diagnose MRSA infection nor to guide or monitor treatment for MRSA infections. Test performance is not FDA approved in patients less than 25  years old. Performed at [facility], [address], [place],  [address]           Radiology Studies: No results found.      Scheduled Meds:  aspirin  81 mg Oral Daily   atorvastatin  80 mg Oral Daily   Chlorhexidine Gluconate Cloth  6 each Topical Q0600   docusate  100 mg Oral BID   enoxaparin (LOVENOX) injection  40 mg Subcutaneous Q24H   losartan  25 mg Oral Daily   metoprolol tartrate  25 mg Oral BID   polyethylene glycol  17 g Per Tube Daily   prasugrel  10  mg Oral Daily   sodium chloride flush  3 mL Intravenous Q12H   Continuous Infusions:  sodium chloride 10 mL/hr at 12/25/20 0522   lidocaine 1 mg/min (12/25/20 0522)          Aline August, MD [facility] 12/25/2020, 8:24 AM

## 2020-12-25 NOTE — Progress Notes (Signed)
Progress Note  Patient Name: Darryl Gilbert Date of Encounter: 12/25/2020  Primary Cardiologist: [name]  Subjective   Feels well this morning.  No chest pain, dyspnea, or palpitations.  He was started on a lidocaine drip yesterday afternoon after discussion with EP.  Inpatient Medications    Scheduled Meds:  aspirin  81 mg Oral Daily   atorvastatin  80 mg Oral Daily   Chlorhexidine Gluconate Cloth  6 each Topical Q0600   docusate  100 mg Oral BID   enoxaparin (LOVENOX) injection  40 mg Subcutaneous Q24H   losartan  25 mg Oral Daily   metoprolol tartrate  25 mg Oral BID   polyethylene glycol  17 g Per Tube Daily   prasugrel  10 mg Oral Daily   sodium chloride flush  3 mL Intravenous Q12H   Continuous Infusions:  sodium chloride 10 mL/hr at 12/25/20 0522   lidocaine 1 mg/min (12/25/20 0522)   PRN Meds: sodium chloride, acetaminophen, cyclobenzaprine, midazolam, oxyCODONE-acetaminophen, sodium chloride flush   Vital Signs    Vitals:   12/25/20 0300 12/25/20 0400 12/25/20 0500 12/25/20 0600  BP: (!) 99/59 107/64 107/64 115/66  Pulse: (!) 58 (!) 48 (!) 57 (!) 53  Resp: '13 19 14 16  '$ Temp:      TempSrc:      SpO2: 91% 93% 96% 98%  Weight:      Height:        Intake/Output Summary (Last 24 hours) at 12/25/2020 0759 Last data filed at 12/25/2020 0554 Gross per 24 hour  Intake 1648.3 ml  Output 2750 ml  Net -1101.7 ml   Filed Weights   12/20/20 1447 12/20/20 1641  Weight: 96.2 kg 93.6 kg    Telemetry    Frequent runs of atrial tachycardia yesterday morning through early afternoon with subsequent development of a sinus rhythm since- Personally Reviewed  ECG    Repeat EKG this morning pending with EKG from 9/7 demonstrating sinus bradycardia, 57 bpm, frequent PACs, nonspecific ST-T changes, improved QT at 414 ms- Personally Reviewed  Physical Exam   GEN: No acute distress.   Neck: No JVD. Cardiac: RRR, no murmurs, rubs, or gallops.  Respiratory: Clear to  auscultation bilaterally.  GI: Soft, nontender, non-distended.   MS: No edema; No deformity. Neuro:  Alert and oriented x 3; Nonfocal.  Psych: Normal affect.  Labs    Chemistry Recent Labs  Lab 12/20/20 1448 12/21/20 0543 12/22/20 0222 12/22/20 1216 12/23/20 0541 12/24/20 0540 12/25/20 0530  NA 135 135   < > 136 136 138 135  K 3.9 3.9   < > 3.9 3.7 4.2 4.2  CL 102 104   < > 105 100 102 100  CO2 23 23   < > '25 27 28 28  '$ GLUCOSE 139* 102*   < > 112* 99 96 114*  BUN 22 26*   < > 28* 32* 30* 31*  CREATININE 1.63* 1.33*   < > 1.30* 1.27* 1.40* 1.31*  CALCIUM 9.6 9.1   < > 8.8* 9.1 9.5 9.0  PROT 7.5 6.8  --  6.9  --   --   --   ALBUMIN 4.1 3.7  --  3.5  --   --   --   AST 30 318*  --  139*  --   --   --   ALT 22 69*  --  52*  --   --   --   ALKPHOS 49 40  --  43  --   --   --   BILITOT 1.0 0.6  --  0.9  --   --   --   GFRNONAA 47* 60*   < > >60 >60 56* >60  ANIONGAP 10 8   < > '6 9 8 7   '$ < > = values in this interval not displayed.     Hematology Recent Labs  Lab 12/23/20 0541 12/24/20 0540 12/25/20 0530  WBC 10.4 10.0 9.4  RBC 4.30 4.70 4.45  HGB 13.8 14.8 14.3  HCT 39.9 43.1 40.8  MCV 92.8 91.7 91.7  MCH 32.1 31.5 32.1  MCHC 34.6 34.3 35.0  RDW 13.3 13.0 12.9  PLT 179 204 208    Cardiac EnzymesNo results for input(s): TROPONINI in the last 168 hours. No results for input(s): TROPIPOC in the last 168 hours.   BNPNo results for input(s): BNP, PROBNP in the last 168 hours.   DDimer No results for input(s): DDIMER in the last 168 hours.   Radiology    No results found.  Cardiac Studies   Limited echo 12/22/2020: 1. Limited tudy   2. Left ventricular ejection fraction, by estimation, is 45 to 50%. The  left ventricle has mildly decreased function. The left ventricle  demonstrates regional wall motion abnormalities (Hypokinesis of the  inferolateral wall). There is mild left  ventricular hypertrophy.   3. Right ventricular systolic function is normal. The  right ventricular  size is normal. Tricuspid regurgitation signal is inadequate for assessing  PA pressure. __________  2D echo 12/21/2020:  1. Left ventricular ejection fraction, by estimation, is 50%. The left  ventricle has low normal function. The left ventricle demonstrates  regional wall motion abnormalities (see scoring diagram/findings for  description). There is mild left ventricular  hypertrophy. Left ventricular diastolic parameters are consistent with  Grade II diastolic dysfunction (pseudonormalization). There is moderate  hypokinesis of the left ventricular, entire inferolateral wall.   2. Right ventricular systolic function is normal. The right ventricular  size is normal.   3. The mitral valve is normal in structure. No evidence of mitral valve  regurgitation.   4. The aortic valve is tricuspid. Aortic valve regurgitation is not  visualized.  __________  LHC 12/20/2020:   Ost RCA to Prox RCA lesion is 70% stenosed.   Prox Cx to Mid Cx lesion is 100% stenosed.   1st Mrg lesion is 60% stenosed.   A drug-eluting stent was successfully placed using a STENT ONYX FRONTIER 3.0X22.   Post intervention, there is a 0% residual stenosis.   There is moderate left ventricular systolic dysfunction.   LV end diastolic pressure is moderately elevated.   The left ventricular ejection fraction is 35-45% by visual estimate.   1.  Codominant coronary arteries with thrombotic occlusion of the proximal left circumflex which is the culprit for inferior STEMI.  There is also 70% stenosis in ostial right coronary artery which has high anterior takeoff.  Catheter induced spasm in the ostial right coronary artery cannot be completely excluded. 2.  Moderately reduced LV systolic function with segmental wall motion abnormalities in the left circumflex distribution.  Moderately elevated left ventricular end-diastolic pressure at 22 mmHg. 3.  Successful angioplasty and drug-eluting stent placement to  the left circumflex.   Recommendations: Dual antiplatelet therapy for at least 12 months. Aggressive treatment of risk factors and smoking cessation. The patient had atrial fibrillation on presentation but converted sinus rhythm at the end of the case with amiodarone drip.  Continue amiodarone drip for now and monitor rhythm closely.   Patient Profile     65 y.o. male with history of hypertension, prior tobacco use, chronic back pain, and followed for recent inferior ST elevation myocardial infarction s/p emergent 9/30 LHC with PCI/DES to the left circumflex, new acute systolic heart failure due to post MI cardiomyopathy, atrial fibrillation with RVR thought likely due to myocardial ischemia and subsequent V. tach V. Fib/torsades de pointes.  Assessment & Plan    1.  Inferior STEMI: -No chest pain -Status post PCI/DES to the LCx with residual 70% ostial RCA stenosis -Continue DAPT without interruption with ASA and Effient (previously on ticagrelor though had associated dyspnea with this leading him to be transitioned to prasugrel with noted resolution of shortness of breath) -With regards to residual RCA stenosis, medical management will be pursued at this time with PCI reserved for refractory symptoms -Should PCI of the RCA need to be undertaken, given his history of torsades, he would need close monitoring in the hospital thereafter per EP -Cardiac rehab  2.  Acute HFrEF secondary to ICM: -Limited 2D echo post MI shows an EF of 45 to 50% -Continue Lopressor and losartan (as BP allows) -Relative hypotension precludes further escalation of GDMT -Not currently requiring standing diuretic -Recommend obtaining repeat echo in the outpatient setting in several months time following PCI and optimization of GDMT to evaluate for improvement in LVSF -CHF education  3.  Torsades de points: -Occurred post MI in the setting of marked QT prolongation and IV amiodarone -QT has normalized by EKG on  9/7 -Avoid QT prolonging medications, website discussed with patient by EP -With no further significant ventricular arrhythmia noted on telemetry, discontinue lidocaine -Continue Lopressor 25 mg twice daily, BP precludes titration at this time -Current magnesium at 2.2, recommend maintaining above goal 2.0 -Potassium 4.2 with recommendation to maintain above goal 4.0 -Await formal consult by EP  4.  PAF with RVR: -Occurred in the context of his inferior MI with no further recurrence noted -Continue metoprolol -Not currently on [place] given need for DAPT and isolated episode -Should he have recurrence of A. Fib, [place] will need to be reconsidered at that time -Consider outpatient cardiac monitoring at discharge -Medication options are limited outside of the above  5.  Atrial tachycardia/frequent PACs: -Episodes of atrial tachycardia have improved over the past 24 hours, though he does continue to have frequent PACs, largely asymptomatic -Lopressor as above -Ultimately, may need to consider ablation given limited medication options  6.  HTN: -Blood pressure stable, though has been soft at times -Medications as above  7.  HLD: -LDL 134 with goal being less than 70, not on a statin prior to admission -Atorvastatin 80 mg -Follow-up fasting lipid panel and LFT in the outpatient setting in approximately 8 weeks with recommendation to escalate lipid therapy to achieve target LDL less than 70  8.  Hyperglycemia: -A1c 6.0 -Follow-up with PCP as outpatient  9.  Renal dysfunction: -Uncertain chronicity -Overall stable -Continue to monitor  For questions or updates, please contact [place] Please consult [email or web address] for contact info under Cardiology/STEMI.    Signed, Christell Faith, PA-C [facility] Pager: [phone] 12/25/2020, 7:59 AM

## 2020-12-25 NOTE — Evaluation (Signed)
Physical Therapy Evaluation Patient Details Name: Darryl Gilbert MRN: [id number] DOB: March 27, 1956 Today's Date: 12/25/2020   History of Present Illness  65 y.o. male presents via EMS with chest pain; he was found to have an inferior lateral STEMI. Reported PMH of hypertension, tobacco use and chronic back pain.  Clinical Impression  Pt received supine in bed, awake and agreeable to therapy. Pleasant and demo good safety awareness throughout session. He lives in a trailer with his wife, has 5 STE with B railings. 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 PT organized and managed all lines and leads prior to mobilization. SUP provided for bed mobility and STS for safety, especially as to not pull on lines. Multiple STS performed, pt progressed from SUP to Mod I. Short distance ambulation performed within room; CGA for minimal steadying initially w/o AD. PT educated pt he was safe to stand from recliner however not safe to ambulate w/o assistance of nursing staff - pt agreeable. RN notified. Due to minimally decreased standing stability and limited functional endurance, pt would benefit from continued skilled therapy while in the hospital. No PT needs after d/c.     Follow Up Recommendations No PT follow up    Equipment Recommendations  None recommended by PT    Recommendations for Other Services       Precautions / Restrictions Precautions Precautions: None Restrictions Weight Bearing Restrictions: No      Mobility  Bed Mobility Overal bed mobility: Needs Assistance Bed Mobility: Supine to Sit     Supine to sit: Supervision     General bed mobility comments: SUP for safety; PT managed lines and leads    Transfers Overall transfer level: Needs assistance Equipment used: None Transfers: Sit to/from Stand Sit to Stand: Supervision         General transfer comment: SUP for safety and line management; progressed to Mod I from recliner  Ambulation/Gait Ambulation/Gait  assistance: Min guard Gait Distance (Feet): 3 Feet Assistive device: None Gait Pattern/deviations: Step-through pattern;Decreased stride length;Trunk flexed Gait velocity: decreased   General Gait Details: cautious steps taken to recliner. CGA for minimal steadying upon initial step. No AD used.  Stairs            Wheelchair Mobility    Modified Rankin (Stroke Patients Only)       Balance Overall balance assessment: Needs assistance Sitting-balance support: Feet supported;No upper extremity supported Sitting balance-Leahy Scale: Normal Sitting balance - Comments: no sitting balance concerns   Standing balance support: No upper extremity supported;During functional activity Standing balance-Leahy Scale: Fair Standing balance comment: CGA required on one occasion during first step of ambulation. SUP with static standing and during standing trunk flexion/extension to stretch.                             Pertinent Vitals/Pain Pain Assessment: No/denies pain    Home Living Family/patient expects to be discharged to:: Private residence Living Arrangements: Spouse/significant other Available Help at Discharge: Family Type of Home: Mobile home Home Access: Stairs to enter Entrance Stairs-Rails: Can reach both Entrance Stairs-Number of Steps: 5 Home Layout: One level Home Equipment: Crutches;Cane - single point;Walker - 2 wheels;Shower seat      Prior Function Level of Independence: Independent         Comments: fully independent with ADLs and IADLs.     Hand Dominance        Extremity/Trunk Assessment   Upper Extremity  Assessment Upper Extremity Assessment: Overall WFL for tasks assessed    Lower Extremity Assessment Lower Extremity Assessment: Overall WFL for tasks assessed (5/5 BLE)    Cervical / Trunk Assessment Cervical / Trunk Assessment: Normal  Communication   Communication: No difficulties  Cognition Arousal/Alertness:  Awake/alert Behavior During Therapy: WFL for tasks assessed/performed Overall Cognitive Status: Within Functional Limits for tasks assessed                                 General Comments: A&Ox4; pleasant and agreeable throughout; good safety awareness.      General Comments      Exercises Other Exercises Other Exercises: Pt educated on PT role, POC, safety with mobility, hospital-acquired deconditioning, recovery post-d/c, d/c recs. Additional time taken to organize and manage patient's multiple lines and leads. Vitals monitored during activity.   Assessment/Plan    PT Assessment Patient needs continued PT services  PT Problem List Decreased activity tolerance;Decreased balance;Decreased mobility       PT Treatment Interventions Balance training;Gait training;Stair training;Neuromuscular re-education;Therapeutic activities;Functional mobility training;Patient/family education    PT Goals (Current goals can be found in the Care Plan section)  Acute Rehab PT Goals Patient Stated Goal: to go home and return to work (desk job, remote from home) PT Goal Formulation: With patient Time For Goal Achievement: 01/08/21 Potential to Achieve Goals: Good    Frequency Min 2X/week   Barriers to discharge        Co-evaluation               AM-PAC PT "6 Clicks" Mobility  Outcome Measure Help needed turning from your back to your side while in a flat bed without using bedrails?: None Help needed moving from lying on your back to sitting on the side of a flat bed without using bedrails?: None Help needed moving to and from a bed to a chair (including a wheelchair)?: A Little Help needed standing up from a chair using your arms (e.g., wheelchair or bedside chair)?: A Little Help needed to walk in hospital room?: A Little Help needed climbing 3-5 steps with a railing? : A Little 6 Click Score: 20    End of Session Equipment Utilized During Treatment: Gait  belt Activity Tolerance: Patient tolerated treatment well Patient left: in chair;with call bell/phone within reach Nurse Communication: Mobility status PT Visit Diagnosis: Unsteadiness on feet (R26.81);Other abnormalities of gait and mobility (R26.89)    Time: [id number] PT Time Calculation (min) (ACUTE ONLY): 27 min   Charges:   PT Evaluation $PT Eval Low Complexity: 1 Low PT Treatments $Therapeutic Activity: 8-22 mins        Patrina Levering PT, DPT 12/25/20 11:28 AM [id number]   Darryl Gilbert 12/25/2020, 11:20 AM

## 2020-12-26 LAB — BASIC METABOLIC PANEL
Anion gap: 10 (ref 5–15)
BUN: 31 mg/dL — ABNORMAL HIGH (ref 8–23)
CO2: 27 mmol/L (ref 22–32)
Calcium: 9.7 mg/dL (ref 8.9–10.3)
Chloride: 98 mmol/L (ref 98–111)
Creatinine, Ser: 1.31 mg/dL — ABNORMAL HIGH (ref 0.61–1.24)
GFR, Estimated: >60 mL/min (ref >60)
Glucose, Bld: 106 mg/dL — ABNORMAL HIGH (ref 70–99)
Potassium: 4.2 mmol/L (ref 3.5–5.1)
Sodium: 135 mmol/L (ref 135–145)

## 2020-12-26 LAB — MAGNESIUM: Magnesium: 2.4 mg/dL (ref 1.7–2.4)

## 2020-12-26 MED ORDER — ATORVASTATIN CALCIUM 80 MG PO TABS
80.0000 mg | ORAL_TABLET | Freq: Every day | ORAL | 0 refills | Status: DC
Start: 2020-12-26 — End: 2020-12-31

## 2020-12-26 MED ORDER — ASPIRIN 81 MG PO CHEW: 81.0000 mg | CHEWABLE_TABLET | Freq: Every day | ORAL | 0 refills | Status: AC

## 2020-12-26 MED ORDER — PRASUGREL HCL 10 MG PO TABS
10.0000 mg | ORAL_TABLET | Freq: Every day | ORAL | 0 refills | Status: DC
Start: 2020-12-26 — End: 2020-12-31

## 2020-12-26 MED ORDER — METOPROLOL TARTRATE 25 MG PO TABS: 25.0000 mg | ORAL_TABLET | Freq: Two times a day (BID) | ORAL | 0 refills | Status: DC

## 2020-12-26 MED ORDER — LOSARTAN POTASSIUM 25 MG PO TABS: 25.0000 mg | ORAL_TABLET | Freq: Every day | ORAL | 0 refills | Status: DC

## 2020-12-26 NOTE — Progress Notes (Signed)
Progress Note  Patient Name: Darryl Gilbert Date of Encounter: 12/26/2020  Primary Cardiologist: [name]  Subjective   Feels well. No chest pain. Notes some mild SOB. Lytes at goal this morning. Does not feel volume up.   Inpatient Medications    Scheduled Meds:  aspirin  81 mg Oral Daily   atorvastatin  80 mg Oral Daily   Chlorhexidine Gluconate Cloth  6 each Topical Q0600   docusate  100 mg Oral BID   enoxaparin (LOVENOX) injection  40 mg Subcutaneous Q24H   losartan  25 mg Oral Daily   metoprolol tartrate  25 mg Oral BID   polyethylene glycol  17 g Per Tube Daily   prasugrel  10 mg Oral Daily   sodium chloride flush  3 mL Intravenous Q12H   Continuous Infusions:  sodium chloride Stopped (12/25/20 0900)   PRN Meds: sodium chloride, acetaminophen, alum & mag hydroxide-simeth, cyclobenzaprine, midazolam, oxyCODONE-acetaminophen, sodium chloride flush   Vital Signs    Vitals:   12/26/20 0200 12/26/20 0300 12/26/20 0700 12/26/20 0800  BP: 108/65 117/62 121/78 129/73  Pulse: (!) 51 60 (!) 50 63  Resp: 18 (!) 28 (!) 21 (!) 21  Temp: 98.9 F (37.2 C)   97.9 F (36.6 C)  TempSrc: Oral   Oral  SpO2: 96% 95% 97% 91%  Weight:      Height:        Intake/Output Summary (Last 24 hours) at 12/26/2020 0829 Last data filed at 12/26/2020 0745 Gross per 24 hour  Intake 1039.14 ml  Output 2825 ml  Net -1785.86 ml   Filed Weights   12/20/20 1447 12/20/20 1641  Weight: 96.2 kg 93.6 kg    Telemetry    SR with occasional isolated PACs - Personally Reviewed  ECG    Repeat EKG from 9/7 demonstrating sinus bradycardia, 57 bpm, frequent PACs, nonspecific ST-T changes, improved QT at 414 ms, order from 9/8 remains pending - Personally Reviewed  Physical Exam   GEN: No acute distress.   Neck: No JVD. Cardiac: RRR, no murmurs, rubs, or gallops.  Respiratory: Clear to auscultation bilaterally.  GI: Soft, nontender, non-distended.   MS: No edema; No deformity. Neuro:  Alert  and oriented x 3; Nonfocal.  Psych: Normal affect.  Labs    Chemistry Recent Labs  Lab 12/20/20 1448 12/21/20 0543 12/22/20 0222 12/22/20 1216 12/23/20 0541 12/24/20 0540 12/25/20 0530 12/26/20 0726  NA 135 135   < > 136   < > 138 135 135  K 3.9 3.9   < > 3.9   < > 4.2 4.2 4.2  CL 102 104   < > 105   < > 102 100 98  CO2 23 23   < > 25   < > '28 28 27  '$ GLUCOSE 139* 102*   < > 112*   < > 96 114* 106*  BUN 22 26*   < > 28*   < > 30* 31* 31*  CREATININE 1.63* 1.33*   < > 1.30*   < > 1.40* 1.31* 1.31*  CALCIUM 9.6 9.1   < > 8.8*   < > 9.5 9.0 9.7  PROT 7.5 6.8  --  6.9  --   --   --   --   ALBUMIN 4.1 3.7  --  3.5  --   --   --   --   AST 30 318*  --  139*  --   --   --   --  ALT 22 69*  --  52*  --   --   --   --   ALKPHOS 49 40  --  43  --   --   --   --   BILITOT 1.0 0.6  --  0.9  --   --   --   --   GFRNONAA 47* 60*   < > >60   < > 56* >60 >60  ANIONGAP 10 8   < > 6   < > '8 7 10   '$ < > = values in this interval not displayed.     Hematology Recent Labs  Lab 12/23/20 0541 12/24/20 0540 12/25/20 0530  WBC 10.4 10.0 9.4  RBC 4.30 4.70 4.45  HGB 13.8 14.8 14.3  HCT 39.9 43.1 40.8  MCV 92.8 91.7 91.7  MCH 32.1 31.5 32.1  MCHC 34.6 34.3 35.0  RDW 13.3 13.0 12.9  PLT 179 204 208    Cardiac EnzymesNo results for input(s): TROPONINI in the last 168 hours. No results for input(s): TROPIPOC in the last 168 hours.   BNPNo results for input(s): BNP, PROBNP in the last 168 hours.   DDimer No results for input(s): DDIMER in the last 168 hours.   Radiology    No results found.  Cardiac Studies   Limited echo 12/22/2020: 1. Limited tudy   2. Left ventricular ejection fraction, by estimation, is 45 to 50%. The  left ventricle has mildly decreased function. The left ventricle  demonstrates regional wall motion abnormalities (Hypokinesis of the  inferolateral wall). There is mild left  ventricular hypertrophy.   3. Right ventricular systolic function is normal. The  right ventricular  size is normal. Tricuspid regurgitation signal is inadequate for assessing  PA pressure. __________  2D echo 12/21/2020:  1. Left ventricular ejection fraction, by estimation, is 50%. The left  ventricle has low normal function. The left ventricle demonstrates  regional wall motion abnormalities (see scoring diagram/findings for  description). There is mild left ventricular  hypertrophy. Left ventricular diastolic parameters are consistent with  Grade II diastolic dysfunction (pseudonormalization). There is moderate  hypokinesis of the left ventricular, entire inferolateral wall.   2. Right ventricular systolic function is normal. The right ventricular  size is normal.   3. The mitral valve is normal in structure. No evidence of mitral valve  regurgitation.   4. The aortic valve is tricuspid. Aortic valve regurgitation is not  visualized.  __________  LHC 12/20/2020:   Ost RCA to Prox RCA lesion is 70% stenosed.   Prox Cx to Mid Cx lesion is 100% stenosed.   1st Mrg lesion is 60% stenosed.   A drug-eluting stent was successfully placed using a STENT ONYX FRONTIER 3.0X22.   Post intervention, there is a 0% residual stenosis.   There is moderate left ventricular systolic dysfunction.   LV end diastolic pressure is moderately elevated.   The left ventricular ejection fraction is 35-45% by visual estimate.   1.  Codominant coronary arteries with thrombotic occlusion of the proximal left circumflex which is the culprit for inferior STEMI.  There is also 70% stenosis in ostial right coronary artery which has high anterior takeoff.  Catheter induced spasm in the ostial right coronary artery cannot be completely excluded. 2.  Moderately reduced LV systolic function with segmental wall motion abnormalities in the left circumflex distribution.  Moderately elevated left ventricular end-diastolic pressure at 22 mmHg. 3.  Successful angioplasty and drug-eluting stent placement to  the left  circumflex.   Recommendations: Dual antiplatelet therapy for at least 12 months. Aggressive treatment of risk factors and smoking cessation. The patient had atrial fibrillation on presentation but converted sinus rhythm at the end of the case with amiodarone drip.  Continue amiodarone drip for now and monitor rhythm closely.   Patient Profile     65 y.o. 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 Tach, V. Fib/torsades de pointes.  Assessment & Plan    1.  Inferior STEMI: -No chest pain -Status post PCI/DES to the LCx with residual 70% ostial RCA stenosis -Continue DAPT without interruption with ASA and Effient (previously on ticagrelor though had associated dyspnea with this leading him to be transitioned to prasugrel with noted resolution of shortness of breath) -With regards to residual RCA stenosis, medical management will be pursued at this time with PCI reserved for refractory symptoms despite optimization of antianginal therapy  -Should PCI of the RCA need to be undertaken, given his history of torsades, he would need close monitoring in the hospital thereafter per EP -Aggressive risk factor modification and secondary prevention  -Post cath instructions  -Cardiac rehab  2.  Acute HFrEF secondary to ICM: -Appears euvolemic and well compensated  -No hypoxic events -Wean from O2 as tolerated -May benefit from IS -Limited 2D echo post MI shows an EF of 45 to 50% -Continue Lopressor and losartan  -Relative hypotension precludes further escalation of GDMT -Not currently requiring standing diuretic -Recommend obtaining repeat echo in the outpatient setting in several  months time following PCI and optimization of GDMT to evaluate for improvement in LVSF -CHF education  3.  Torsades de points: -Occurred post MI in the setting of marked QT prolongation and IV amiodarone -QT has normalized by EKG on 9/7 -Avoid QT prolonging medications, website discussed with patient by EP -With no further significant ventricular arrhythmia noted on telemetry, lidocaine was stopped on 9/8 -Continue Lopressor 25 mg twice daily, BP precludes titration at this time -Current magnesium at 2.4, recommend maintaining above goal 2.0 -Potassium 4.2 with recommendation to maintain above goal 4.0 -EP has consulted, see their note  4.  PAF with RVR: -Occurred in the context of his inferior MI with no further recurrence noted -Continue metoprolol, prn metoprolol for breakthrough episodes -Not currently on [place] given need for DAPT and isolated episode -Should he have recurrence of A. Fib, [place] will need to be reconsidered at that time -Consider outpatient cardiac monitoring in follow up, MD prefers [facility] vs [employer] at this time  -Medication options are limited outside of the above  5.  Atrial tachycardia/frequent PACs: -Episodes of atrial tachycardia have improved over the past 24 hours, though he does continue to have frequent PACs, largely asymptomatic -Lopressor as above -Ultimately, may need to consider ablation given limited medication options  6.  HTN: -Blood pressure stable -Medications as above  7.  HLD: -LDL 134 with goal being less than 70, not on a statin prior to admission -Atorvastatin 80 mg -Follow-up fasting lipid panel and LFT in the outpatient setting in approximately 8 weeks with recommendation to escalate lipid therapy to achieve target LDL less than 70  8.  Hyperglycemia: -A1c 6.0 -Follow-up with PCP as outpatient  9.  Renal dysfunction: -Uncertain chronicity -Overall stable -Continue to monitor  [facility] for discharge from our perspective on  current cardiac medications. We will arrange follow up in our office.   For questions or updates, please contact [place] Please consult [email or web address] for contact info under Cardiology/STEMI.    Signed, Christell Faith, PA-C [place] Pager: [phone] 12/26/2020, 8:29 AM

## 2020-12-26 NOTE — Discharge Summary (Signed)
Physician Discharge Summary  Darryl Gilbert [id number] DOB: 1956/02/17 DOA: 12/20/2020  PCP: Patient, No Pcp Per (Inactive)  Admit date: 12/20/2020 Discharge date: 12/26/2020  Admitted From: Home Disposition: Home  Recommendations for Outpatient Follow-up:  Follow up with PCP in 1 week with repeat CBC/BMP Outpatient follow-up with cardiology Follow up in ED if symptoms worsen or new appear   Home Health: No Equipment/Devices: None  Discharge Condition: Stable CODE STATUS: Full Diet recommendation: Heart healthy/fluid restriction of up to 1200 cc a day  Brief/Interim Summary: 65 year old male with history of hypertension, chronic back pain, tobacco use presented with chest pain and was found to have inferior lateral STEMI.  Cardiology was consulted; he underwent cardiac cath and DES to left circumflex.  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  He had episodes of A. fib for which amiodarone was started.  He was transferred to ICU postcardiac catheterization.  Subsequently he had polymorphic VT and V. fib requiring defibrillation; amiodarone drip was discontinued.  He was subsequently intubated on 12/21/2020.  He was extubated on 12/22/2020.  He was transferred back to [facility] service on 12/24/2020.  He had intermittent tachycardia for which she was briefly placed on lidocaine drip; EP was consulted.  Lidocaine drip was subsequently discontinued.  Subsequently, he has much improved and hemodynamically stable.  Cardiology has cleared the patient for discharge.  He will be discharged home today with close outpatient follow-up with cardiology.  Discharge Diagnoses:   Acute respiratory failure with hypoxia in the setting of cardiac dysrhythmia -Patient required intubation on 12/21/2020 after polymorphic VT and V. fib requiring defibrillation -Extubated on 12/22/2020 -Currently intermittently requiring 2 L oxygen by nasal cannula.  Will walk him around to see if he  needs supplemental oxygen on discharge.   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  Outpatient follow-up with cardiology.  Continue aspirin, prasugrel, statin and metoprolol on discharge.   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  Patient received 1 dose of IV Lasix on 12/23/2020.  He has not required any more diuretic since then and will not need any diuretic on discharge. -Continue diet and fluid restriction.  - Continue metoprolol and losartan   [place] Polymorphic ventricular tachycardia/V. fib in the setting of prolonged QTC -Status post defibrillation.  Off amiodarone -Avoid QT prolonging medications. --Treated with lidocaine drip briefly and subsequently discontinued as per EP recommendations.  Continue current dose of metoprolol on discharge.  Paroxysmal A. fib -Currently intermittently bradycardic.  Continue metoprolol.  Anticoagulation not started by cardiology in the setting of ongoing DAPT   Hypertension -Blood pressure stable.  Continue metoprolol and losartan   Hyperlipidemia  -continue Lipitor   Chronic tobacco use -Cessation advised   Leukocytosis -Resolved  Discharge Instructions  Discharge Instructions     AMB Referral to Cardiac Rehabilitation - Phase II   Complete by: As directed    Diagnosis:  STEMI Coronary Stents     After initial evaluation and assessments completed: Virtual Based Care may be provided alone or in conjunction with Phase 2 Cardiac Rehab based on patient barriers.: Yes   Ambulatory referral to Cardiology   Complete by: As directed    Diet - low sodium heart healthy   Complete by: As directed  Increase activity slowly   Complete by: As directed       Allergies as of 12/26/2020        Reactions   Amiodarone Other (See Comments)   QT prolongation in the context of inferior STEMI and IV amiodarone use. Avoid all QT prolonging medications.         Medication List     STOP taking these medications    cyclobenzaprine 10 MG tablet Commonly known as: FLEXERIL   meloxicam 15 MG tablet Commonly known as: MOBIC   olmesartan 20 MG tablet Commonly known as: BENICAR   oxyCODONE-acetaminophen 7.5-325 MG tablet Commonly known as: Percocet       TAKE these medications    aspirin 81 MG chewable tablet Chew 1 tablet (81 mg total) by mouth daily.   atorvastatin 80 MG tablet Commonly known as: LIPITOR Take 1 tablet (80 mg total) by mouth daily.   losartan 25 MG tablet Commonly known as: COZAAR Take 1 tablet (25 mg total) by mouth daily. What changed:  medication strength how much to take   metoprolol tartrate 25 MG tablet Commonly known as: LOPRESSOR Take 1 tablet (25 mg total) by mouth 2 (two) times daily.   prasugrel 10 MG Tabs tablet Commonly known as: EFFIENT Take 1 tablet (10 mg total) by mouth daily.        Follow-up Information     PCP. Schedule an appointment as soon as possible for a visit in 1 week(s).          End, Harrell Gave, MD. Schedule an appointment as soon as possible for a visit in 1 week(s).   Specialty: Cardiology Contact information: [address] [place] [address] [phone]         Nelva Bush, MD Follow up today.   Specialty: Cardiology Why: appointment Sept 23 2022 DR END OFFICE AT 0830AM Contact information: [place] [id number] [phone]                Allergies  Allergen Reactions   Amiodarone Other (See Comments)    QT prolongation in the context of inferior STEMI and IV amiodarone use. Avoid all QT prolonging medications.     Consultations: PCCM/cardiology/EP   Procedures/Studies: DG Chest 1 View  Result Date: 12/22/2020 CLINICAL  DATA:  Encounter for central line placement EXAM: CHEST  1 VIEW COMPARISON:  Earlier today FINDINGS: New left IJ line with tip near the SVC origin. No pneumothorax or new mediastinal widening. Enteric and endotracheal tubes in good position. Stable pulmonary inflation and interstitial coarsening. Normal heart size. IMPRESSION: New central line and enteric tube without complicating feature. Stable aeration. Electronically Signed   By: Monte Fantasia M.D.   On: 12/22/2020 05:04   DG Chest 1 View  Result Date: 12/22/2020 CLINICAL DATA:  OG tube placement, post code EXAM: CHEST  1 VIEW COMPARISON:  None. FINDINGS: Endotracheal tube has been placed with the tip 2 cm above the carina. No OG tube is visualized. Heart is normal size. No confluent airspace opacity, effusion or pneumothorax. No acute bony abnormality. IMPRESSION: Endotracheal tube 2 cm above the carina.  No visible OG tube. No acute cardiopulmonary disease. Electronically Signed   By: Rolm Baptise M.D.   On: 12/22/2020 02:47   CARDIAC CATHETERIZATION  Result Date: 12/20/2020   Ost RCA to Prox RCA lesion is 70% stenosed.   Prox Cx to Mid Cx lesion is 100% stenosed.   1st Mrg lesion  is 60% stenosed.   A drug-eluting stent was successfully placed using a STENT ONYX FRONTIER 3.0X22.   Post intervention, there is a 0% residual stenosis.   There is moderate left ventricular systolic dysfunction.   LV end diastolic pressure is moderately elevated.   The left ventricular ejection fraction is 35-45% by visual estimate. 1.  Codominant coronary arteries with thrombotic occlusion of the proximal left circumflex which is the culprit for inferior STEMI.  There is also 70% stenosis in ostial right coronary artery which has high anterior takeoff.  Catheter induced spasm in the ostial right coronary artery cannot be completely excluded. 2.  Moderately reduced LV systolic function with segmental wall motion abnormalities in the left circumflex distribution.  Moderately  elevated left ventricular end-diastolic pressure at 22 mmHg. 3.  Successful angioplasty and drug-eluting stent placement to the left circumflex. Recommendations: Dual antiplatelet therapy for at least 12 months. Aggressive treatment of risk factors and smoking cessation. The patient had atrial fibrillation on presentation but converted sinus rhythm at the end of the case with amiodarone drip.  Continue amiodarone drip for now and monitor rhythm closely.   DG Chest Port 1 View  Result Date: 12/23/2020 CLINICAL DATA:  65 year old male with respiratory failure. EXAM: PORTABLE CHEST 1 VIEW COMPARISON:  Portable chest 12/22/2020 and earlier. FINDINGS: Portable AP upright view at 0548 hours. Extubated. Enteric tube removed. Stable left IJ central line. Mildly lower lung volumes. Platelike curvilinear opacity at the left lung base is new. Streaky right lung base opacity has mildly increased. Mediastinal contours remain normal. No pneumothorax, pulmonary edema, pleural effusion. No acute osseous abnormality identified. Negative visible bowel gas. IMPRESSION: 1. Extubated and enteric tube removed.  Stable left IJ central line. 2. Lower lung volumes with increased bibasilar opacity most resembling atelectasis. Electronically Signed   By: Genevie Ann M.D.   On: 12/23/2020 06:18   DG Chest Port 1 View  Result Date: 12/21/2020 CLINICAL DATA:  Respiratory distress EXAM: PORTABLE CHEST 1 VIEW COMPARISON:  None. FINDINGS: Normal heart size. Normal mediastinal contour. No pneumothorax. No pleural effusion. Lungs appear clear, with no acute consolidative airspace disease and no pulmonary edema. IMPRESSION: No active disease. Electronically Signed   By: Ilona Sorrel M.D.   On: 12/21/2020 07:10   ECHOCARDIOGRAM COMPLETE  Result Date: 12/21/2020    ECHOCARDIOGRAM REPORT   Patient Name:   Darryl Gilbert [facility] Date of Exam: 12/21/2020 Medical Rec #:  [id number]    Height:       73.0 in Accession #:    [id number]   Weight:       206.3 lb Date of  Birth:  03-May-1955    BSA:          2.180 m Patient Age:    70 years     BP:           126/75 mmHg Patient Gender: M            HR:           62 bpm. Exam Location:  [facility] Procedure: 2D Echo, 3D Echo and Strain Analysis Indications:     AMI I21.9  History:         Patient has no prior history of Echocardiogram examinations.  Sonographer:     Kathlen Brunswick RDCS Referring Phys:  [id number] A ARIDA Diagnosing Phys: Kate Sable MD  Sonographer Comments: Global longitudinal strain was attempted. IMPRESSIONS  1. Left ventricular ejection fraction, by estimation, is 50%. The left ventricle  has low normal function. The left ventricle demonstrates regional wall motion abnormalities (see scoring diagram/findings for description). There is mild left ventricular hypertrophy. Left ventricular diastolic parameters are consistent with Grade II diastolic dysfunction (pseudonormalization). There is moderate hypokinesis of the left ventricular, entire inferolateral wall.  2. Right ventricular systolic function is normal. The right ventricular size is normal.  3. The mitral valve is normal in structure. No evidence of mitral valve regurgitation.  4. The aortic valve is tricuspid. Aortic valve regurgitation is not visualized. FINDINGS  Left Ventricle: Left ventricular ejection fraction, by estimation, is 50%. The left ventricle has low normal function. The left ventricle demonstrates regional wall motion abnormalities. Moderate hypokinesis of the left ventricular, entire inferolateral  wall. Global longitudinal strain performed but not reported based on interpreter judgement due to suboptimal tracking. 3D left ventricular ejection fraction analysis performed but not reported based on interpreter judgement due to suboptimal quality. The left ventricular internal cavity size was normal in size. There is mild left ventricular hypertrophy. Left ventricular diastolic parameters are consistent with Grade II diastolic dysfunction  (pseudonormalization). Right Ventricle: The right ventricular size is normal. No increase in right ventricular wall thickness. Right ventricular systolic function is normal. Left Atrium: Left atrial size was normal in size. Right Atrium: Right atrial size was normal in size. Pericardium: There is no evidence of pericardial effusion. Mitral Valve: The mitral valve is normal in structure. No evidence of mitral valve regurgitation. Tricuspid Valve: The tricuspid valve is normal in structure. Tricuspid valve regurgitation is not demonstrated. Aortic Valve: The aortic valve is tricuspid. Aortic valve regurgitation is not visualized. Aortic valve peak gradient measures 5.7 mmHg. Pulmonic Valve: The pulmonic valve was not well visualized. Pulmonic valve regurgitation is not visualized. Aorta: The aortic root is normal in size and structure. Venous: The inferior vena cava was not well visualized. IAS/Shunts: No atrial level shunt detected by color flow Doppler.  LEFT VENTRICLE PLAX 2D LVIDd:         4.90 cm      Diastology LVIDs:         3.40 cm      LV e' medial:    5.66 cm/s LV PW:         1.10 cm      LV E/e' medial:  12.9 LV IVS:        1.30 cm      LV e' lateral:   4.35 cm/s LVOT diam:     2.00 cm      LV E/e' lateral: 16.7 LV SV:         40 LV SV Index:   18 LVOT Area:     3.14 cm                              3D Volume EF: LV Volumes (MOD)            3D EF:        55 % LV vol d, MOD A2C: 139.0 ml LV EDV:       246 ml LV vol s, MOD A2C: 75.4 ml  LV ESV:       110 ml LV SV MOD A2C:     63.6 ml  LV SV:        135 ml RIGHT VENTRICLE RV Basal diam:  2.80 cm RV S prime:     13.10 cm/s TAPSE (M-mode): 1.9 cm LEFT ATRIUM  Index       RIGHT ATRIUM           Index LA diam:        2.90 cm 1.33 cm/m  RA Area:     10.90 cm LA Vol (A2C):   46.7 ml 21.42 ml/m RA Volume:   26.00 ml  11.92 ml/m LA Vol (A4C):   37.2 ml 17.06 ml/m LA Biplane Vol: 41.9 ml 19.22 ml/m  AORTIC VALVE AV Area (Vmax): 1.93 cm AV Vmax:         119.00 cm/s AV Peak Grad:   5.7 mmHg LVOT Vmax:      73.10 cm/s LVOT Vmean:     43.500 cm/s LVOT VTI:       0.126 m  AORTA Ao Root diam: 3.30 cm MITRAL VALVE MV Area (PHT): 2.60 cm    SHUNTS MV Decel Time: 292 msec    Systemic VTI:  0.13 m MV E velocity: 72.80 cm/s  Systemic Diam: 2.00 cm MV A velocity: 53.10 cm/s MV E/A ratio:  1.37 Kate Sable MD Electronically signed by Kate Sable MD Signature Date/Time: 12/21/2020/10:34:11 AM    Final    ECHOCARDIOGRAM LIMITED  Result Date: 12/22/2020    ECHOCARDIOGRAM LIMITED REPORT   Patient Name:   Darryl Gilbert [facility] Date of Exam: 12/22/2020 Medical Rec #:  [id number]    Height:       73.0 in Accession #:    [id number]   Weight:       206.3 lb Date of Birth:  Jun 26, 1955    BSA:          2.180 m Patient Age:    28 years     BP:           132/66 mmHg Patient Gender: M            HR:           69 bpm. Exam Location:  [facility] Procedure: Limited Echo Indications:     Cardiac Arrest I46.9  History:         Patient has prior history of Echocardiogram examinations, most                  recent 12/21/2020.  Sonographer:     Kathlen Brunswick RDCS Referring Phys:  [id number] Kate Sable Diagnosing Phys: Ida Rogue MD IMPRESSIONS  1. Limited tudy  2. Left ventricular ejection fraction, by estimation, is 45 to 50%. The left ventricle has mildly decreased function. The left ventricle demonstrates regional wall motion abnormalities (Hypokinesis of the inferolateral wall). There is mild left ventricular hypertrophy.  3. Right ventricular systolic function is normal. The right ventricular size is normal. Tricuspid regurgitation signal is inadequate for assessing PA pressure. FINDINGS  Left Ventricle: Left ventricular ejection fraction, by estimation, is 45 to 50%. The left ventricle has mildly decreased function. The left ventricle demonstrates regional wall motion abnormalities. The left ventricular internal cavity size was normal in size. There is mild left ventricular hypertrophy.  Right Ventricle: The right ventricular size is normal. No increase in right ventricular wall thickness. Right ventricular systolic function is normal. Tricuspid regurgitation signal is inadequate for assessing PA pressure. Left Atrium: Left atrial size was normal in size. Right Atrium: Right atrial size was normal in size. Pericardium: There is no evidence of pericardial effusion. Mitral Valve: The mitral valve is normal in structure. No evidence of mitral valve stenosis. Tricuspid Valve: The tricuspid valve is normal in structure. Tricuspid valve regurgitation is not  demonstrated. No evidence of tricuspid stenosis. Aortic Valve: The aortic valve was not well visualized. Aortic valve regurgitation is not visualized. No aortic stenosis is present. Pulmonic Valve: The pulmonic valve was normal in structure. Pulmonic valve regurgitation is not visualized. No evidence of pulmonic stenosis. Aorta: The aortic root is normal in size and structure. Venous: The inferior vena cava is normal in size with greater than 50% respiratory variability, suggesting right atrial pressure of 3 mmHg. IAS/Shunts: No atrial level shunt detected by color flow Doppler. LEFT VENTRICLE PLAX 2D LVIDd:         4.70 cm LVIDs:         3.50 cm LV PW:         1.30 cm LV IVS:        1.30 cm  LV Volumes (MOD) LV vol d, MOD A4C: 108.0 ml LV vol s, MOD A4C: 50.2 ml LV SV MOD A4C:     108.0 ml Ida Rogue MD Electronically signed by Ida Rogue MD Signature Date/Time: 12/22/2020/4:17:11 PM    Final       Subjective: Patient seen and examined at bedside.  He feels much better and wants to go home today.  Denies any current chest pain, nausea, vomiting or shortness of breath.  Discharge Exam: Vitals:   12/26/20 1000 12/26/20 1100  BP: 130/67 (!) 129/115  Pulse: 73 73  Resp: (!) 21 (!) 25  Temp:    SpO2: 100% 95%    General: Pt is alert, awake, not in acute distress Cardiovascular: rate controlled, S1/S2 + Respiratory: bilateral  decreased breath sounds at bases with some scattered crackles and intermittently tachypneic Abdominal: Soft, NT, ND, bowel sounds + Extremities: Trace lower extremity edema; no cyanosis    The results of significant diagnostics from this hospitalization (including imaging, microbiology, ancillary and laboratory) are listed below for reference.     Microbiology: Recent Results (from the past 240 hour(s))  Resp Panel by RT-PCR (Flu A&B, Covid) Nasopharyngeal Swab     Status: None   Collection Time: 12/20/20  2:48 PM   Specimen: Nasopharyngeal Swab; Nasopharyngeal(NP) swabs in vial transport medium  Result Value Ref Range Status   SARS Coronavirus 2 by RT PCR NEGATIVE NEGATIVE Final    Comment: (NOTE) SARS-CoV-2 target nucleic acids are NOT DETECTED.  The SARS-CoV-2 RNA is generally detectable in upper respiratory specimens during the acute phase of infection. The lowest concentration of SARS-CoV-2 viral copies this assay can detect is 138 copies/mL. A negative result does not preclude SARS-Cov-2 infection and should not be used as the sole basis for treatment or other patient management decisions. A negative result may occur with  improper specimen collection/handling, submission of specimen other than nasopharyngeal swab, presence of viral mutation(s) within the areas targeted by this assay, and inadequate number of viral copies(<138 copies/mL). A negative result must be combined with clinical observations, patient history, and epidemiological information. The expected result is Negative.  Fact Sheet for Patients:  EntrepreneurPulse.com.au  Fact Sheet for Healthcare Providers:  IncredibleEmployment.be  This test is no t yet approved or cleared by the [place] FDA and  has been authorized for detection and/or diagnosis of SARS-CoV-2 by FDA under an Emergency Use Authorization (EUA). This EUA will remain  in effect (meaning this test can  be used) for the duration of the COVID-19 declaration under Section 564(b)(1) of the Act, 21 U.S.C.section 360bbb-3(b)(1), unless the authorization is terminated  or revoked sooner.       Influenza A  by PCR NEGATIVE NEGATIVE Final   Influenza B by PCR NEGATIVE NEGATIVE Final    Comment: (NOTE) The Xpert Xpress SARS-CoV-2/FLU/RSV plus assay is intended as an aid in the diagnosis of influenza from Nasopharyngeal swab specimens and should not be used as a sole basis for treatment. Nasal washings and aspirates are unacceptable for Xpert Xpress SARS-CoV-2/FLU/RSV testing.  Fact Sheet for Patients: EntrepreneurPulse.com.au  Fact Sheet for Healthcare Providers: IncredibleEmployment.be  This test is not yet approved or cleared by the [place] FDA and has been authorized for detection and/or diagnosis of SARS-CoV-2 by FDA under an Emergency Use Authorization (EUA). This EUA will remain in effect (meaning this test can be used) for the duration of the COVID-19 declaration under Section 564(b)(1) of the Act, 21 U.S.C. section 360bbb-3(b)(1), unless the authorization is terminated or revoked.  Performed at [facility], [address] [place], [place] [address]   MRSA Next Gen by PCR, Nasal     Status: None   Collection Time: 12/20/20  4:43 PM   Specimen: Nasal Mucosa; Nasal Swab  Result Value Ref Range Status   MRSA by PCR Next Gen NOT DETECTED NOT DETECTED Final    Comment: (NOTE) The GeneXpert MRSA Assay (FDA approved for NASAL specimens only), is one component of a comprehensive MRSA colonization surveillance program. It is not intended to diagnose MRSA infection nor to guide or monitor treatment for MRSA infections. Test performance is not FDA approved in patients less than 90 years old. Performed at [facility], [address] [place], [place] [address]      Labs: BNP (last 3 results) No results for input(s):  BNP in the last 8760 hours. Basic Metabolic Panel: Recent Labs  Lab 12/22/20 0222 12/22/20 1216 12/23/20 0541 12/24/20 0540 12/25/20 0530 12/26/20 0726  NA 136 136 136 138 135 135  K 3.5 3.9 3.7 4.2 4.2 4.2  CL 101 105 100 102 100 98  CO2 '26 25 27 28 28 27  '$ GLUCOSE 140* 112* 99 96 114* 106*  BUN 28* 28* 32* 30* 31* 31*  CREATININE 1.37* 1.30* 1.27* 1.40* 1.31* 1.31*  CALCIUM 10.8* 8.8* 9.1 9.5 9.0 9.7  MG 2.2  --  2.2 2.3 2.2 2.4  PHOS  --   --  3.6 3.9 3.8  --    Liver Function Tests: Recent Labs  Lab 12/20/20 1448 12/21/20 0543 12/22/20 1216  AST 30 318* 139*  ALT 22 69* 52*  ALKPHOS 49 40 43  BILITOT 1.0 0.6 0.9  PROT 7.5 6.8 6.9  ALBUMIN 4.1 3.7 3.5   No results for input(s): LIPASE, AMYLASE in the last 168 hours. No results for input(s): AMMONIA in the last 168 hours. CBC: Recent Labs  Lab 12/20/20 1448 12/21/20 0543 12/22/20 0222 12/23/20 0541 12/24/20 0540 12/25/20 0530  WBC 9.9 10.6* 13.7* 10.4 10.0 9.4  NEUTROABS 6.1  --   --   --   --   --   HGB 15.2 13.8 15.4 13.8 14.8 14.3  HCT 42.9 39.9 43.5 39.9 43.1 40.8  MCV 91.5 91.3 93.5 92.8 91.7 91.7  PLT 250 185 245 179 204 208   Cardiac Enzymes: No results for input(s): CKTOTAL, CKMB, CKMBINDEX, TROPONINI in the last 168 hours. BNP: Invalid input(s): POCBNP CBG: Recent Labs  Lab 12/20/20 1642  GLUCAP 115*   D-Dimer No results for input(s): DDIMER in the last 72 hours. Hgb A1c No results for input(s): HGBA1C in the last 72 hours. Lipid Profile No results  for input(s): CHOL, HDL, LDLCALC, TRIG, CHOLHDL, LDLDIRECT in the last 72 hours. Thyroid function studies No results for input(s): TSH, T4TOTAL, T3FREE, THYROIDAB in the last 72 hours.  Invalid input(s): FREET3 Anemia work up No results for input(s): VITAMINB12, FOLATE, FERRITIN, TIBC, IRON, RETICCTPCT in the last 72 hours. Urinalysis No results found for: COLORURINE, APPEARANCEUR, [place], [place], GLUCOSEU, [place], [place],  [place], PROTEINUR, UROBILINOGEN, NITRITE, LEUKOCYTESUR Sepsis Labs Invalid input(s): PROCALCITONIN,  WBC,  LACTICIDVEN Microbiology Recent Results (from the past 240 hour(s))  Resp Panel by RT-PCR (Flu A&B, Covid) Nasopharyngeal Swab     Status: None   Collection Time: 12/20/20  2:48 PM   Specimen: Nasopharyngeal Swab; Nasopharyngeal(NP) swabs in vial transport medium  Result Value Ref Range Status   SARS Coronavirus 2 by RT PCR NEGATIVE NEGATIVE Final    Comment: (NOTE) SARS-CoV-2 target nucleic acids are NOT DETECTED.  The SARS-CoV-2 RNA is generally detectable in upper respiratory specimens during the acute phase of infection. The lowest concentration of SARS-CoV-2 viral copies this assay can detect is 138 copies/mL. A negative result does not preclude SARS-Cov-2 infection and should not be used as the sole basis for treatment or other patient management decisions. A negative result may occur with  improper specimen collection/handling, submission of specimen other than nasopharyngeal swab, presence of viral mutation(s) within the areas targeted by this assay, and inadequate number of viral copies(<138 copies/mL). A negative result must be combined with clinical observations, patient history, and epidemiological information. The expected result is Negative.  Fact Sheet for Patients:  EntrepreneurPulse.com.au  Fact Sheet for Healthcare Providers:  IncredibleEmployment.be  This test is no t yet approved or cleared by the [place] FDA and  has been authorized for detection and/or diagnosis of SARS-CoV-2 by FDA under an Emergency Use Authorization (EUA). This EUA will remain  in effect (meaning this test can be used) for the duration of the COVID-19 declaration under Section 564(b)(1) of the Act, 21 U.S.C.section 360bbb-3(b)(1), unless the authorization is terminated  or revoked sooner.       Influenza A by PCR NEGATIVE NEGATIVE  Final   Influenza B by PCR NEGATIVE NEGATIVE Final    Comment: (NOTE) The Xpert Xpress SARS-CoV-2/FLU/RSV plus assay is intended as an aid in the diagnosis of influenza from Nasopharyngeal swab specimens and should not be used as a sole basis for treatment. Nasal washings and aspirates are unacceptable for Xpert Xpress SARS-CoV-2/FLU/RSV testing.  Fact Sheet for Patients: EntrepreneurPulse.com.au  Fact Sheet for Healthcare Providers: IncredibleEmployment.be  This test is not yet approved or cleared by the [place] FDA and has been authorized for detection and/or diagnosis of SARS-CoV-2 by FDA under an Emergency Use Authorization (EUA). This EUA will remain in effect (meaning this test can be used) for the duration of the COVID-19 declaration under Section 564(b)(1) of the Act, 21 U.S.C. section 360bbb-3(b)(1), unless the authorization is terminated or revoked.  Performed at [facility], [address] [place], [place] [address]   MRSA Next Gen by PCR, Nasal     Status: None   Collection Time: 12/20/20  4:43 PM   Specimen: Nasal Mucosa; Nasal Swab  Result Value Ref Range Status   MRSA by PCR Next Gen NOT DETECTED NOT DETECTED Final    Comment: (NOTE) The GeneXpert MRSA Assay (FDA approved for NASAL specimens only), is one component of a comprehensive MRSA colonization surveillance program. It is not intended to diagnose MRSA infection nor to guide or monitor treatment for MRSA infections. Test  performance is not FDA approved in patients less than 30 years old. Performed at [facility], [address], [place], [place] [address]      Time coordinating discharge: 35 minutes  SIGNED:   Aline August, MD  [facility] 12/26/2020, 11:18 AM

## 2020-12-29 ENCOUNTER — Telehealth: Payer: Self-pay | Admitting: Cardiovascular Disease

## 2020-12-29 NOTE — Telephone Encounter (Signed)
Patient calling Would like to know if he can take nexium daily and a stool softener with other medications  Please call to discuss

## 2020-12-29 NOTE — Telephone Encounter (Signed)
Will route to PharmD to advise.

## 2020-12-29 NOTE — Telephone Encounter (Signed)
Patient made aware of Megan PharmDs response and recommendation. Patient verbalized understanding and voiced appreciation for the call back.

## 2020-12-29 NOTE — Telephone Encounter (Signed)
Ok to take Nexium and stool softener with his other meds.

## 2020-12-31 ENCOUNTER — Encounter: Payer: Self-pay | Admitting: Physician Assistant

## 2020-12-31 ENCOUNTER — Ambulatory Visit (INDEPENDENT_AMBULATORY_CARE_PROVIDER_SITE_OTHER): Payer: Self-pay | Admitting: Physician Assistant

## 2020-12-31 ENCOUNTER — Other Ambulatory Visit: Payer: Self-pay

## 2020-12-31 VITALS — BP 142/82 | HR 56 | Ht 73.0 in | Wt 194.0 lb

## 2020-12-31 DIAGNOSIS — G8929 Other chronic pain: Secondary | ICD-10-CM

## 2020-12-31 DIAGNOSIS — E785 Hyperlipidemia, unspecified: Secondary | ICD-10-CM

## 2020-12-31 DIAGNOSIS — I10 Essential (primary) hypertension: Secondary | ICD-10-CM

## 2020-12-31 DIAGNOSIS — I491 Atrial premature depolarization: Secondary | ICD-10-CM

## 2020-12-31 DIAGNOSIS — I471 Supraventricular tachycardia: Secondary | ICD-10-CM

## 2020-12-31 DIAGNOSIS — Z515 Encounter for palliative care: Secondary | ICD-10-CM | POA: Insufficient documentation

## 2020-12-31 DIAGNOSIS — I255 Ischemic cardiomyopathy: Secondary | ICD-10-CM

## 2020-12-31 DIAGNOSIS — I2121 ST elevation (STEMI) myocardial infarction involving left circumflex coronary artery: Secondary | ICD-10-CM

## 2020-12-31 DIAGNOSIS — R739 Hyperglycemia, unspecified: Secondary | ICD-10-CM

## 2020-12-31 DIAGNOSIS — I4719 Other supraventricular tachycardia: Secondary | ICD-10-CM

## 2020-12-31 DIAGNOSIS — I4901 Ventricular fibrillation: Secondary | ICD-10-CM

## 2020-12-31 DIAGNOSIS — I5020 Unspecified systolic (congestive) heart failure: Secondary | ICD-10-CM

## 2020-12-31 DIAGNOSIS — I472 Ventricular tachycardia, unspecified: Secondary | ICD-10-CM

## 2020-12-31 DIAGNOSIS — I2510 Atherosclerotic heart disease of native coronary artery without angina pectoris: Secondary | ICD-10-CM

## 2020-12-31 DIAGNOSIS — R7989 Other specified abnormal findings of blood chemistry: Secondary | ICD-10-CM

## 2020-12-31 DIAGNOSIS — I480 Paroxysmal atrial fibrillation: Secondary | ICD-10-CM

## 2020-12-31 DIAGNOSIS — M546 Pain in thoracic spine: Secondary | ICD-10-CM

## 2020-12-31 DIAGNOSIS — I4721 Torsades de pointes: Secondary | ICD-10-CM

## 2020-12-31 DIAGNOSIS — N289 Disorder of kidney and ureter, unspecified: Secondary | ICD-10-CM

## 2020-12-31 DIAGNOSIS — R945 Abnormal results of liver function studies: Secondary | ICD-10-CM

## 2020-12-31 MED ORDER — LOSARTAN POTASSIUM 25 MG PO TABS: 25.0000 mg | ORAL_TABLET | Freq: Every day | ORAL | 6 refills | Status: DC

## 2020-12-31 MED ORDER — NITROGLYCERIN 0.4 MG SL SUBL: 0.4000 mg | SUBLINGUAL_TABLET | SUBLINGUAL | 3 refills | Status: DC | PRN

## 2020-12-31 MED ORDER — HYDROCODONE-ACETAMINOPHEN 5-325 MG PO TABS: 1.0000 | ORAL_TABLET | Freq: Two times a day (BID) | ORAL | 0 refills | Status: AC | PRN

## 2020-12-31 MED ORDER — ATORVASTATIN CALCIUM 80 MG PO TABS: 80.0000 mg | ORAL_TABLET | Freq: Every day | ORAL | 6 refills | Status: DC

## 2020-12-31 MED ORDER — METOPROLOL TARTRATE 25 MG PO TABS: 25.0000 mg | ORAL_TABLET | Freq: Two times a day (BID) | ORAL | 6 refills | Status: DC

## 2020-12-31 MED ORDER — ESOMEPRAZOLE MAGNESIUM 20 MG PO CPDR: 20.0000 mg | DELAYED_RELEASE_CAPSULE | Freq: Every day | ORAL | 6 refills | Status: AC

## 2020-12-31 MED ORDER — HYDROCODONE-ACETAMINOPHEN 5-325 MG PO TABS: 1.0000 | ORAL_TABLET | Freq: Two times a day (BID) | ORAL | 0 refills | Status: DC | PRN

## 2020-12-31 MED ORDER — PRASUGREL HCL 10 MG PO TABS: 10.0000 mg | ORAL_TABLET | Freq: Every day | ORAL | 6 refills | Status: DC

## 2020-12-31 NOTE — Progress Notes (Signed)
Cardiology Office Note    Date:  12/31/2020   ID:  Darryl Gilbert, DOB Dec 25, 1955, MRN [id number]  PCP:  Patient, No Pcp Per (Inactive)  Cardiologist:  Darryl Sacramento, MD  Electrophysiologist:  Darryl Axe, MD   Chief Complaint: Hospital follow-up  History of Present Illness:   Darryl Gilbert is a 65 y.o. male with history of recently diagnosed CAD in the setting of inferior ST elevation MI on 12/20/2020 status post PCI/DES to the LCx complicated by V. tach/V. fib/torsades to points, HFrEF secondary to post MI ICM, A. fib, HTN, HLD, hyperglycemia, prior tobacco use, and chronic back pain who presents for complex hospital follow-up after recent admission to [facility] on 9/3 through 9/9 as outlined below.  He was recently admitted to [facility] on 12/20/2020 with an inferior STEMI complicated by acute HFrEF secondary to post MI ICM, V. tach/V. fib/torsades to points in the setting of marked QT prolongation, IV amiodarone, and MI, PAF with RVR, and renal dysfunction.  Upon presentation, he underwent emergent LHC which showed codominant coronary arteries with thrombotic occlusion of the proximal LCx which was the culprit for the inferior ST EMI.  There was also 70% stenosis in the ostial RCA which had a high anterior takeoff.  Catheter induced spasm in the ostial right coronary artery was unable to be completely excluded.  He underwent successful PCI/DES to the LCx.  Initial presenting rhythm was A. fib, though he converted to sinus rhythm by the end of his LHC with amiodarone drip.  Echo post PCI on 9/4, demonstrated an EF of 50%, mild LVH, moderate hypokinesis of the entire inferolateral wall, grade 2 diastolic dysfunction, normal RV systolic function and ventricular cavity size, and no significant valvular abnormalities.  Post PCI on 9/5, he began to develop frequent PVCs along with runs of NSVT.  He was asymptomatic with this.  He then went into polymorphic VT that degenerated into ventricular fibrillation which  required multiple shocks.  He was intubated for airway protection.  Twelve-lead EKG showed no significant ST elevation, though QT interval was significantly prolonged at greater than 550 ms.  His presentation was consistent with torsades to points.  Given this, amiodarone was discontinued.  He received 2 g of magnesium.  He then went into SVT converting to sinus rhythm with IV metoprolol.  With discontinuation of amiodarone, QT interval continued to improve throughout his hospital stay and he had no further episodes of significant ventricular arrhythmias.  Throughout his stay he was noted to have frequent PACs and short runs of atrial tachycardia.  After discussion with EP, he was briefly placed on a lidocaine drip.  Interventional cardiology indicated there was no need for relook angiography given his event did not seem to be mediated by stent thrombosis as he had no complaints of chest pain or anginal symptoms and his EKG showed no ST elevation.  QT prolonging medications were recommended to be avoided.  Repeat limited echo following the above on 9/5 demonstrated an EF of 45 to 50%, hypokinesis of the inferolateral wall, mild LVH, normal RV systolic function and ventricular cavity size.  He comes in accompanied by his wife today and is doing reasonably well from a cardiac perspective.  He is somewhat surprised at the amount of fatigue he has been experiencing following his discharge.  He did return back to work immediately following discharge, though does work from home.  He has largely been working half days.  No angina, dyspnea, palpitations, dizziness, presyncope, syncope, falls,  hematochezia, or melena.  He has not missed any doses of DAPT.  He does have chronic thoracic back pain that stems from a prior injury.  He was previously on Flexeril for this, though this was discontinued during his hospital admission.  His back pain becomes so severe at times it limits his functional status and ability to recover  from a cardiac perspective.  He does plan to discuss this further with his PCP, Dr. Lavera Gilbert, at his next visit with them.   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    Hyperlipidemia LDL goal <70    Hypertension    Ischemic cardiomyopathy     Past Surgical History:  Procedure Laterality Date   CORONARY/GRAFT ACUTE MI REVASCULARIZATION N/A 12/20/2020   Procedure: Coronary/Graft Acute MI Revascularization;  Surgeon: Darryl Hampshire, MD;  Location: [place] [facility];  Service: Cardiovascular;  Laterality: N/A;   LEFT HEART CATH AND CORONARY ANGIOGRAPHY N/A 12/20/2020   Procedure: LEFT HEART CATH AND CORONARY ANGIOGRAPHY;  Surgeon: Darryl Hampshire, MD;  Location: [place] [facility];  Service: Cardiovascular;  Laterality: N/A;    Current Medications: Current Meds  Medication Sig   aspirin 81 MG chewable tablet Chew 1 tablet (81 mg total) by mouth daily.   HYDROcodone-acetaminophen (NORCO/VICODIN) 5-325 MG tablet Take 1 tablet by mouth 2 (two) times daily as needed for up to 3 days for severe pain.   nitroGLYCERIN (NITROSTAT) 0.4 MG SL tablet Place 1 tablet (0.4 mg total) under the tongue every 5 (five) minutes as needed for chest pain.   [DISCONTINUED] atorvastatin (LIPITOR) 80 MG tablet Take 1 tablet (80 mg total) by mouth daily.   [DISCONTINUED] esomeprazole (NEXIUM) 20 MG capsule Take 20 mg by mouth daily at 12 noon.   [DISCONTINUED] HYDROcodone-acetaminophen (NORCO/VICODIN) 5-325 MG tablet Take 1 tablet by mouth 2 (two) times daily as needed for up to 3 days for severe pain.   [DISCONTINUED] losartan (COZAAR) 25 MG tablet  Take 1 tablet (25 mg total) by mouth daily.   [DISCONTINUED] metoprolol tartrate (LOPRESSOR) 25 MG tablet Take 1 tablet (25 mg total) by mouth 2 (two) times daily.   [DISCONTINUED] prasugrel (EFFIENT) 10 MG TABS tablet Take 1 tablet (10 mg total) by mouth daily.    Allergies:   Amiodarone   Social History   Socioeconomic History   Marital status: Divorced    Spouse name: Not on file   Number of children: Not on file   Years of education: Not on file   Highest education level: Not on file  Occupational History   Not on file  Tobacco Use   Smoking status: Every Day   Smokeless tobacco: Never  Substance and Sexual Activity   Alcohol use: Not on file   Drug use: Not on file   Sexual activity: Not on file  Other Topics Concern   Not on file  Social History Narrative   Not on file   Social Determinants of Health   Financial Resource Strain: Not on file  Food Insecurity: Not on file  Transportation Needs: Not on file  Physical Activity:  Not on file  Stress: Not on file  Social Connections: Not on file     Family History:  The patient's family history is not on file.  ROS:   Review of Systems  Constitutional:  Positive for malaise/fatigue. Negative for chills, diaphoresis, fever and weight loss.  HENT:  Negative for congestion.   Eyes:  Negative for discharge and redness.  Respiratory:  Negative for cough, sputum production, shortness of breath and wheezing.   Cardiovascular:  Negative for chest pain, palpitations, orthopnea, claudication, leg swelling and PND.  Gastrointestinal:  Negative for abdominal pain, blood in stool, heartburn, melena, nausea and vomiting.  Musculoskeletal:  Positive for back pain. Negative for falls and myalgias.  Skin:  Negative for rash.  Neurological:  Negative for dizziness, tingling, tremors, sensory change, speech change, focal weakness, loss of consciousness and weakness.  Endo/Heme/Allergies:  Does not bruise/bleed easily.   Psychiatric/Behavioral:  Negative for substance abuse. The patient is not nervous/anxious.   All other systems reviewed and are negative.   EKGs/Labs/Other Studies Reviewed:    Studies reviewed were summarized above. The additional studies were reviewed today:  LHC 12/20/2020:   Ost RCA to Prox RCA lesion is 70% stenosed.   Prox Cx to Mid Cx lesion is 100% stenosed.   1st Mrg lesion is 60% stenosed.   A drug-eluting stent was successfully placed using a STENT ONYX FRONTIER 3.0X22.   Post intervention, there is a 0% residual stenosis.   There is moderate left ventricular systolic dysfunction.   LV end diastolic pressure is moderately elevated.   The left ventricular ejection fraction is 35-45% by visual estimate.   1.  Codominant coronary arteries with thrombotic occlusion of the proximal left circumflex which is the culprit for inferior STEMI.  There is also 70% stenosis in ostial right coronary artery which has high anterior takeoff.  Catheter induced spasm in the ostial right coronary artery cannot be completely excluded. 2.  Moderately reduced LV systolic function with segmental wall motion abnormalities in the left circumflex distribution.  Moderately elevated left ventricular end-diastolic pressure at 22 mmHg. 3.  Successful angioplasty and drug-eluting stent placement to the left circumflex.   Recommendations: Dual antiplatelet therapy for at least 12 months. Aggressive treatment of risk factors and smoking cessation. The patient had atrial fibrillation on presentation but converted sinus rhythm at the end of the case with amiodarone drip.  Continue amiodarone drip for now and monitor rhythm closely. __________  2D echo 12/21/2020: 1. Left ventricular ejection fraction, by estimation, is 50%. The left  ventricle has low normal function. The left ventricle demonstrates  regional wall motion abnormalities (see scoring diagram/findings for  description). There is mild left  ventricular  hypertrophy. Left ventricular diastolic parameters are consistent with  Grade II diastolic dysfunction (pseudonormalization). There is moderate  hypokinesis of the left ventricular, entire inferolateral wall.   2. Right ventricular systolic function is normal. The right ventricular  size is normal.   3. The mitral valve is normal in structure. No evidence of mitral valve  regurgitation.   4. The aortic valve is tricuspid. Aortic valve regurgitation is not  visualized.  __________  Limited echo 12/22/2020: 1. Limited study.  2. Left ventricular ejection fraction, by estimation, is 45 to 50%. The  left ventricle has mildly decreased function. The left ventricle  demonstrates regional wall motion abnormalities (Hypokinesis of the  inferolateral wall). There is mild left  ventricular hypertrophy.   3. Right ventricular systolic function is normal. The right  ventricular  size is normal. Tricuspid regurgitation signal is inadequate for assessing  PA pressure.     EKG:  EKG is ordered today.  The EKG ordered today demonstrates sinus bradycardia, 56 bpm, inferolateral T wave inversion consistent with prior tracing, no acute ST-T changes, QTc 411 ms  Recent Labs: 12/21/2020: TSH 1.189 12/22/2020: ALT 52 12/25/2020: Hemoglobin 14.3; Platelets 208 12/26/2020: BUN 31; Creatinine, Ser 1.31; Magnesium 2.4; Potassium 4.2; Sodium 135  Recent Lipid Panel    Component Value Date/Time   CHOL 215 (H) 12/21/2020 0543   TRIG 270 (H) 12/21/2020 0543   HDL 27 (L) 12/21/2020 0543   CHOLHDL 8.0 12/21/2020 0543   VLDL 54 (H) 12/21/2020 0543   LDLCALC 134 (H) 12/21/2020 0543    PHYSICAL EXAM:    VS:  BP (!) 142/82 (BP Location: Left Arm, Patient Position: Sitting, Cuff Size: Normal)   Pulse (!) 56   Ht '6\' 1"'$  (1.854 m)   Wt 194 lb (88 kg)   SpO2 99%   BMI 25.60 kg/m   BMI: Body mass index is 25.6 kg/m.  Physical Exam Vitals reviewed.  Constitutional:      Appearance: He is  well-developed.  HENT:     Head: Normocephalic and atraumatic.  Eyes:     General:        Right eye: No discharge.        Left eye: No discharge.  Neck:     Vascular: No JVD.  Cardiovascular:     Rate and Rhythm: Normal rate and regular rhythm.     Pulses:          Posterior tibial pulses are 2+ on the right side and 2+ on the left side.     Heart sounds: Normal heart sounds, S1 normal and S2 normal. Heart sounds not distant. No midsystolic click and no opening snap. No murmur heard.   No friction rub.  Pulmonary:     Effort: Pulmonary effort is normal. No respiratory distress.     Breath sounds: Normal breath sounds. No decreased breath sounds, wheezing or rales.  Chest:     Chest wall: No tenderness.  Abdominal:     General: There is no distension.     Palpations: Abdomen is soft.     Tenderness: There is no abdominal tenderness.  Musculoskeletal:     Cervical back: Normal range of motion.     Right lower leg: No edema.     Left lower leg: No edema.  Skin:    General: Skin is warm and dry.     Nails: There is no clubbing.  Neurological:     Mental Status: He is alert and oriented to person, place, and time.  Psychiatric:        Speech: Speech normal.        Behavior: Behavior normal.        Thought Content: Thought content normal.        Judgment: Judgment normal.    Wt Readings from Last 3 Encounters:  12/31/20 194 lb (88 kg)  12/20/20 206 lb 5.6 oz (93.6 kg)     ASSESSMENT & PLAN:   CAD involving the native coronary arteries with recent inferior STEMI: He is doing well without any symptoms concerning for angina.  Continue secondary prevention with aspirin, Effient, metoprolol, losartan, and atorvastatin.  Aggressive risk factor modification is recommended.  Residual CAD will be medically managed with reservation for repeat angiography with possible PCI reserved for refractory symptoms despite optimization  of antianginal therapy.  Cardiac rehab.  SL NTG sent in.   Post-cath instructions.  HFrEF secondary to post MI ICM: He appears euvolemic and well compensated.  Continue GDMT including losartan and metoprolol.  He is not requiring standing diuretic therapy.  In follow-up, escalate GDMT as able with possible escalation of losartan or addition of spironolactone.  V. tach/V. fib/torsades de points: Likely in the setting of his acute MI and exacerbated by amiodarone use.  Trend electrolytes with recommendation to maintain potassium and magnesium at goal 4.0 and 2.0 respectively.  Avoid QT prolonging medications.  This has previously been discussed with him in detail.  Websites have been provided during EP consult.  He remains on metoprolol.  PAF: No further documented episodes.  Likely in the setting of his acute MI.  Should he have recurrence of A. fib OAC will need to be revisited at that time.  Atrial tachycardia/frequent PACs: Asymptomatic and improving on twelve-lead EKG when compared to hospitalization.  He remains on metoprolol as above.  Should he have increased atrial ectopy burden, EP could consider PVI.  HTN: Blood pressure is mildly elevated in the office today.,  No medication adjustments were made at this time.  Revisit in follow-up.  HLD: LDL 134 with a goal being less than 70, not on a statin prior to recent admission.  Currently on atorvastatin 80 mg and tolerating without issues.  Plan to check a fasting lipid panel and LFT in approximately 8 weeks with recommendation to escalate lipid therapy as indicated to achieve target LDL less than 70.  Abnormal LFTs: Likely in the setting of his acute illness.  Follow-up LFT at his next visit.  Renal dysfunction: Uncertain chronicity.  Check BMP today.  Avoid nephrotoxic agents.  Hyperglycemia: A1c 6.0.  Follow-up with PCP.  Chronic back pain: No red flag symptoms.  Chronic issue, however his level of pain is affecting his recovery/rehabilitation from a cardiac perspective following his recent complex  hospitalization and functional status.  Given this, I have prescribed to him a 3-day supply of Norco so he can continue to recover from a cardiac perspective.  He has been advised that he will need to follow-up with his PCP for ongoing management of his back pain.  We will not authorize any further refills.  Disposition: F/u with Dr. [name] or an APP in 1 month, and Dr. Caryl Comes as directed.   Medication Adjustments/Labs and Tests Ordered: Current medicines are reviewed at length with the patient today.  Concerns regarding medicines are outlined above. Medication changes, Labs and Tests ordered today are summarized above and listed in the Patient Instructions accessible in Encounters.   Signed, Christell Faith, PA-C 12/31/2020 1:11 PM     [address] [place], [place] [address] [phone]

## 2020-12-31 NOTE — Patient Instructions (Addendum)
Medication Instructions:  - Your physician has recommended you make the following change in your medication:   1) START Nitroglycerin 0.4 mg sublingual tablets as needed for chest pain  2) START Norco 5-325 mg- 1 tablet twice daily as needed for pain  3) All of your prescriptions have been refilled to the [facility] pharmacy in [place]  *If you need a refill on your cardiac medications before your next appointment, please call your pharmacy*   Lab Work: - Your physician recommends that you return for lab work today: BMP/ CBC/ Magnesium   If you have labs (blood work) drawn today and your tests are completely normal, you will receive your results only by: [place] (if you have [place]) OR A paper copy in the mail If you have any lab test that is abnormal or we need to change your treatment, we will call you to review the results.   Testing/Procedures: - none ordered   Follow-Up: At [facility], you and your health needs are our priority.  As part of our continuing mission to provide you with exceptional heart care, we have created designated Provider Care Teams.  These Care Teams include your primary Cardiologist (physician) and Advanced Practice Providers (APPs -  Physician Assistants and Nurse Practitioners) who all work together to provide you with the care you need, when you need it.  We recommend signing up for the patient portal called "MyChart".  Sign up information is provided on this After Visit Summary.  MyChart is used to connect with patients for Virtual Visits (Telemedicine).  Patients are able to view lab/test results, encounter notes, upcoming appointments, etc.  Non-urgent messages can be sent to your provider as well.   To learn more about what you can do with MyChart, go to [email or web address].    Your next appointment:   1 month(s)  The format for your next appointment:   In Person  Provider:   You may see [name], MD or one of the following  Advanced Practice Providers on your designated Care Team:   Christell Faith, PA-C    Other Instructions  Nitroglycerin Sublingual Tablets What is this medication? NITROGLYCERIN (nye troe GLI ser in) prevents and treats chest pain (angina). It works by relaxing blood vessels, which decreases the amount of work the heart has to do. It belongs to a group of medications called nitrates. This medicine may be used for other purposes; ask your health care provider or pharmacist if you have questions. COMMON BRAND NAME(S): Nitroquick, Nitrostat, Nitrotab What should I tell my care team before I take this medication? They need to know if you have any of these conditions: Anemia Head injury, recent stroke, or bleeding in the brain Liver disease Previous heart attack An unusual or allergic reaction to nitroglycerin, other medications, foods, dyes, or preservatives Pregnant or trying to get pregnant Breast-feeding How should I use this medication? Take this medication by mouth as needed. Use at the first sign of an angina attack (chest pain or tightness). You can also take this medication 5 to 10 minutes before an event likely to produce chest pain. Follow the directions exactly as written on the prescription label. Place one tablet under your tongue and let it dissolve. Do not swallow whole. Replace the dose if you accidentally swallow it. It will help if your mouth is not dry. Saliva around the tablet will help it to dissolve more quickly. Do not eat or drink, smoke or chew tobacco while a tablet is dissolving.  Sit down when taking this medication. In an angina attack, you should feel better within 5 minutes after your first dose. You can take a dose every 5 minutes up to a total of 3 doses. If you do not feel better or feel worse after 1 dose, call 9-1-1 at once. Do not take more than 3 doses in 15 minutes. Your care team might give you other directions. Follow those directions if they do. Do not take your  medication more often than directed. Talk to your care team about the use of this medication in children. Special care may be needed. Overdosage: If you think you have taken too much of this medicine contact a poison control center or emergency room at once. NOTE: This medicine is only for you. Do not share this medicine with others. What if I miss a dose? This does not apply. This medication is only used as needed. What may interact with this medication? Do not take this medication with any of the following: Certain migraine medications like ergotamine and dihydroergotamine (DHE) Medications used to treat erectile dysfunction like sildenafil, tadalafil, and vardenafil Riociguat This medication may also interact with the following: Alteplase Aspirin Heparin Medications for high blood pressure Medications for mental depression Other medications used to treat angina Phenothiazines like chlorpromazine, mesoridazine, prochlorperazine, thioridazine This list may not describe all possible interactions. Give your health care provider a list of all the medicines, herbs, non-prescription drugs, or dietary supplements you use. Also tell them if you smoke, drink alcohol, or use illegal drugs. Some items may interact with your medicine. What should I watch for while using this medication? Tell your care team if you feel your medication is no longer working. Keep this medication with you at all times. Sit or lie down when you take your medication to prevent falling if you feel dizzy or faint after using it. Try to remain calm. This will help you to feel better faster. If you feel dizzy, take several deep breaths and lie down with your feet propped up, or bend forward with your head resting between your knees. You may get drowsy or dizzy. Do not drive, use machinery, or do anything that needs mental alertness until you know how this medication affects you. Do not stand or sit up quickly, especially if you  are an older patient. This reduces the risk of dizzy or fainting spells. Alcohol can make you more drowsy and dizzy. Avoid alcoholic drinks. Do not treat yourself for coughs, colds, or pain while you are taking this medication without asking your care team for advice. Some ingredients may increase your blood pressure. What side effects may I notice from receiving this medication? Side effects that you should report to your care team as soon as possible: Allergic reactions-skin rash, itching, hives, swelling of the face, lips, tongue, or throat Headache, unusual weakness or fatigue, shortness of breath, nausea, vomiting, rapid heartbeat, blue skin or lips, which may be signs of methemoglobinemia Increased pressure around the brain-severe headache, blurry vision, change in vision, nausea, vomiting Low blood pressure-dizziness, feeling faint or lightheaded, blurry vision Slow heartbeat-dizziness, feeling faint or lightheaded, confusion, trouble breathing, unusual weakness or fatigue Worsening chest pain (angina)-pain, pressure, or tightness in the chest, neck, back, or arms Side effects that usually do not require medical attention (report to your care team if they continue or are bothersome): Dizziness Flushing Headache This list may not describe all possible side effects. Call your doctor for medical advice about side effects. You  may report side effects to FDA at 1-800-FDA-1088. Where should I keep my medication? Keep out of the reach of children. Store at room temperature between 20 and 25 degrees C (68 and 77 degrees F). Store in [facility] [employer]. Protect from light and moisture. Keep tightly closed. Throw away any unused medication after the expiration date. NOTE: This sheet is a summary. It may not cover all possible information. If you have questions about this medicine, talk to your doctor, pharmacist, or health care provider.  2022 Elsevier/Gold Standard (2020-07-14 16:59:41)

## 2021-01-01 LAB — BASIC METABOLIC PANEL
BUN/Creatinine Ratio: 14 (ref 10–24)
BUN: 20 mg/dL (ref 8–27)
CO2: 25 mmol/L (ref 20–29)
Calcium: 9.8 mg/dL (ref 8.6–10.2)
Chloride: 100 mmol/L (ref 96–106)
Creatinine, Ser: 1.4 mg/dL — ABNORMAL HIGH (ref 0.76–1.27)
Glucose: 94 mg/dL (ref 65–99)
Potassium: 4.4 mmol/L (ref 3.5–5.2)
Sodium: 139 mmol/L (ref 134–144)
eGFR: 56 mL/min/{1.73_m2} — ABNORMAL LOW (ref >59)

## 2021-01-01 LAB — CBC
Hematocrit: 42.4 % (ref 37.5–51.0)
Hemoglobin: 14.4 g/dL (ref 13.0–17.7)
MCH: 31.3 pg (ref 26.6–33.0)
MCHC: 34 g/dL (ref 31.5–35.7)
MCV: 92 fL (ref 79–97)
Platelets: 306 10*3/uL (ref 150–450)
RBC: 4.6 x10E6/uL (ref 4.14–5.80)
RDW: 12.7 % (ref 11.6–15.4)
WBC: 9.8 10*3/uL (ref 3.4–10.8)

## 2021-01-01 LAB — MAGNESIUM: Magnesium: 2.1 mg/dL (ref 1.6–2.3)

## 2021-01-08 DIAGNOSIS — Z8679 Personal history of other diseases of the circulatory system: Secondary | ICD-10-CM

## 2021-01-08 HISTORY — DX: Personal history of other diseases of the circulatory system: Z86.79

## 2021-01-09 ENCOUNTER — Ambulatory Visit: Payer: Self-pay | Admitting: Physician Assistant

## 2021-01-19 NOTE — Telephone Encounter (Unsigned)
Pt c/o Shortness Of Breath: STAT if SOB developed within the last 24 hours or pt is noticeably SOB on the phone  1. Are you currently SOB (can you hear that pt is SOB on the phone)? Yes audible after talking for a few   2. How long have you been experiencing SOB? 2 weeks   3. Are you SOB when sitting or when up moving around? Intermittent   4. Are you currently experiencing any other symptoms? Palpitations like a stressed out hard beating but not stressed so not sure

## 2021-01-20 ENCOUNTER — Ambulatory Visit (INDEPENDENT_AMBULATORY_CARE_PROVIDER_SITE_OTHER): Payer: Self-pay | Admitting: Cardiovascular Disease

## 2021-01-20 ENCOUNTER — Other Ambulatory Visit: Payer: Self-pay

## 2021-01-20 ENCOUNTER — Encounter: Payer: Self-pay | Admitting: Cardiovascular Disease

## 2021-01-20 VITALS — BP 112/60 | HR 76 | Ht 73.0 in | Wt 203.0 lb

## 2021-01-20 DIAGNOSIS — I25118 Atherosclerotic heart disease of native coronary artery with other forms of angina pectoris: Secondary | ICD-10-CM

## 2021-01-20 DIAGNOSIS — F419 Anxiety disorder, unspecified: Secondary | ICD-10-CM

## 2021-01-20 DIAGNOSIS — I2121 ST elevation (STEMI) myocardial infarction involving left circumflex coronary artery: Secondary | ICD-10-CM

## 2021-01-20 DIAGNOSIS — I480 Paroxysmal atrial fibrillation: Secondary | ICD-10-CM

## 2021-01-20 DIAGNOSIS — F17200 Nicotine dependence, unspecified, uncomplicated: Secondary | ICD-10-CM

## 2021-01-20 MED ORDER — POTASSIUM CHLORIDE ER 10 MEQ PO TBCR: 10.0000 meq | EXTENDED_RELEASE_TABLET | Freq: Every day | ORAL | 5 refills | Status: DC | PRN

## 2021-01-20 MED ORDER — FUROSEMIDE 20 MG PO TABS: 20.0000 mg | ORAL_TABLET | Freq: Every day | ORAL | 3 refills | Status: DC | PRN

## 2021-01-20 NOTE — Progress Notes (Addendum)
Cardiology Office Note  Date:  01/20/2021   ID:  Darryl, Gilbert 04-Aug-1955, MRN [id number]  PCP:  Lynnell Jude, MD   Chief Complaint  Patient presents with   Other    Patient c.o low HR and SOB. Meds reviewed verbally with patient.     HPI:  Darryl Gilbert is a 65 y.o. male with history of  CAD in the setting of inferior ST elevation MI on 12/20/2020  status post PCI/DES to the LCx  complicated by V. tach/V. fib/torsades to points,  HFrEF secondary to post MI ICM,  A. fib,  HTN,  HLD,  hyperglycemia,  prior tobacco use, and chronic back pain who presents for follow-up of his coronary artery disease, cardiomyopathy  In follow-up today reports having increasing shortness of breath Symptoms for about 1-1/2 weeks "Anxiety"  in the chest , per the wife who presents with him today High stress since recent hospital events  Trying to relax,  Financial issue contributing to his stress Does not want cardiac rehab, will cause too much  Not sleeping well Wakes 1 to 2 AM, can't get back to bed, " I does go to work" Chronic fatigue, tired during the daytime  Atypical chest pain, some shortness of breath  EKG personally reviewed by myself on todays visit Shows normal sinus rhythm, frequent PACs, PVCs, ST and T wave abnormality V4 through V6, inferior leads  Prior hospital records reviewed  admission to [facility] on 9/3 through 12/26/20   inferior STEMI complicated  ICM, V. tach/V. fib/torsades to points in the setting of marked QT prolongation, IV amiodarone, and MI, PAF with RVR, and renal dysfunction.  U  Cardiac catheterization  thrombotic occlusion of the proximal LCx which was the culprit for the inferior ST EMI.  There was also 70% stenosis in the ostial RCA which had a high anterior takeoff.  Catheter induced spasm in the ostial right coronary artery was unable to be completely excluded.   successful PCI/DES to the LCx.   A. fib noted on arrival, though he converted to sinus  rhythm by the end of his LHC with amiodarone drip.    Echo post PCI on 9/4, demonstrated an EF of 50%, mild LVH, moderate hypokinesis of the entire inferolateral wall, grade 2 diastolic dysfunction, normal RV systolic function and ventricular cavity size, and no significant valvular abnormalities.    Post PCI on 9/5, he began to develop frequent PVCs along with runs of NSVT.  He was asymptomatic with this.    polymorphic VT that degenerated into ventricular fibrillation which required multiple shocks.    intubated for airway protection.   EKG with QT interval was significantly prolonged at greater than 550 ms.  His presentation was consistent with torsades to points.   amiodarone was discontinued.   received 2 g of magnesium.   went into SVT converting to sinus rhythm with IV metoprolol.    frequent PACs and short runs of atrial tachycardia.   briefly placed on a lidocaine drip.   QT prolonging medications were recommended to be avoided.   limited echo 9/5 demonstrated an EF of 45 to 50%, hypokinesis of the inferolateral wall, mild LVH, normal RV systolic function and ventricular cavity size.   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  PSH:    Past Surgical History:  Procedure Laterality Date   CORONARY/GRAFT ACUTE MI REVASCULARIZATION N/A 12/20/2020   Procedure: Coronary/Graft Acute MI Revascularization;  Surgeon: Wellington Hampshire, MD;  Location: [place];  Service: Cardiovascular;  Laterality: N/A;   LEFT HEART CATH AND CORONARY ANGIOGRAPHY N/A 12/20/2020    Procedure: LEFT HEART CATH AND CORONARY ANGIOGRAPHY;  Surgeon: Wellington Hampshire, MD;  Location: [place] [facility];  Service: Cardiovascular;  Laterality: N/A;    Current Outpatient Medications  Medication Sig Dispense Refill   aspirin 81 MG chewable tablet Chew 1 tablet (81 mg total) by mouth daily. 30 tablet 0   atorvastatin (LIPITOR) 80 MG tablet Take 1 tablet (80 mg total) by mouth daily. 30 tablet 6   esomeprazole (NEXIUM) 20 MG capsule Take 1 capsule (20 mg total) by mouth daily at 12 noon. 30 capsule 6   losartan (COZAAR) 25 MG tablet Take 1 tablet (25 mg total) by mouth daily. 30 tablet 6   metoprolol tartrate (LOPRESSOR) 25 MG tablet Take 1 tablet (25 mg total) by mouth 2 (two) times daily. 60 tablet 6   nitroGLYCERIN (NITROSTAT) 0.4 MG SL tablet Place 1 tablet (0.4 mg total) under the tongue every 5 (five) minutes as needed for chest pain. 25 tablet 3   prasugrel (EFFIENT) 10 MG TABS tablet Take 1 tablet (10 mg total) by mouth daily. 30 tablet 6   No current facility-administered medications for this visit.     Allergies:   Amiodarone   Social History:  The patient  reports that he has been smoking. He has never used smokeless tobacco.   Family History:   family history is not on file.    Review of Systems: Review of Systems  Constitutional: Negative.   HENT: Negative.    Respiratory:  Positive for shortness of breath.   Cardiovascular: Negative.   Gastrointestinal: Negative.   Musculoskeletal: Negative.   Neurological: Negative.   Psychiatric/Behavioral:  The patient is nervous/anxious.   All other systems reviewed and are negative.   PHYSICAL EXAM: VS:  BP 112/60 (BP Location: Left Arm, Patient Position: Sitting, Cuff Size: Normal)   Pulse 76   Ht 6\' 1"  (1.854 m)   Wt 203 lb (92.1 kg)   SpO2 98%   BMI 26.78 kg/m  , BMI Body mass index is 26.78 kg/m. GEN: Well nourished, well developed, in no acute distress HEENT: normal Neck: no JVD, carotid bruits, or  masses Cardiac: RRR; no murmurs, rubs, or gallops,no edema  Respiratory:  clear to auscultation bilaterally, normal work of breathing GI: soft, nontender, nondistended, + BS MS: no deformity or atrophy Skin: warm and dry, no rash Neuro:  Strength and sensation are intact Psych: euthymic mood, full affect   Recent Labs: 12/21/2020: TSH 1.189 12/22/2020: ALT 52 12/31/2020: BUN 20; Creatinine, Ser 1.40; Hemoglobin 14.4; Magnesium 2.1; Platelets 306; Potassium 4.4; Sodium 139    Lipid Panel Lab Results  Component Value Date   CHOL 215 (H) 12/21/2020   HDL 27 (L) 12/21/2020   LDLCALC 134 (H) 12/21/2020   TRIG 270 (H) 12/21/2020      Wt Readings from Last 3 Encounters:  01/20/21 203 lb (92.1 kg)  12/31/20 194 lb (88 kg)  12/20/20 206 lb 5.6 oz (93.6 kg)  ASSESSMENT AND PLAN:  Problem List Items Addressed This Visit       Cardiology Problems   Paroxysmal atrial fibrillation (HCC) - Primary   STEMI (ST elevation myocardial infarction) ([place])     Other   Smoking   Other Visit Diagnoses     Anxiety       Coronary artery disease of native artery of native heart with stable angina pectoris (HCC)          Shortness of breath Differential includes anxiety, mildly depressed ejection fraction, underlying ischemia, CHF Discussed various treatment options with him, we will start with Lasix 20 mg daily with potassium 10 mill equivalents -Case discussed with Dr. [name], he will review cardiac catheterization images to determine if further intervention needed -He is declining cardiac rehab which would be of significant benefit -There are underlying PVCs on EKG, unclear if this is contributing to his symptoms.  Avoid antiarrhythmics given prolonged QTC issues  Coronary disease with stable angina Will discuss images with Dr. [name], 70% RCA as well as other residual disease Recommend he continue his walking program Little room on his blood pressure to add  nitrates  Hyperlipidemia Repeat lipid panel in follow-up    Total encounter time more than 35 minutes  Greater than 50% was spent in counseling and coordination of care with the patient    Signed, Esmond Plants, M.D., Ph.D. [place], [place]

## 2021-01-20 NOTE — Addendum Note (Signed)
Addended by: Minna Merritts on: 01/20/2021 06:06 PM   Modules accepted: Level of Service

## 2021-01-20 NOTE — Patient Instructions (Addendum)
Medication Instructions:  Please START Lasix 20 mg daily  as needed for shortness of breath Take with potassium  If you need a refill on your cardiac medications before your next appointment, please call your pharmacy.   Lab work: No new labs needed  Testing/Procedures: No new testing needed  Follow-Up: At  [facility], you and your health needs are our priority.  As part of our continuing mission to provide you with exceptional heart care, we have created designated Provider Care Teams.  These Care Teams include your primary Cardiologist (physician) and Advanced Practice Providers (APPs -  Physician Assistants and Nurse Practitioners) who all work together to provide you with the care you need, when you need it.  You will need a follow up appointment in 3 months  Providers on your designated Care Team:   Murray Hodgkins, NP Christell Faith, PA-C Marrianne Mood, PA-C Cadence Newell, [place]  COVID-19 Vaccine Information can be found at: [email or web address].uk For questions related to vaccine distribution or appointments, please email [email or web address] or call [phone].

## 2021-01-22 ENCOUNTER — Telehealth: Payer: Self-pay | Admitting: *Deleted

## 2021-01-22 NOTE — Telephone Encounter (Signed)
-----   Message from Minna Merritts, MD sent at 01/22/2021  2:40 PM EDT ----- See note below He was recently seen in clinic by myself, was having shortness of breath which may be secondary to either fluid or residual blockage Dr. Cecelia Byars has reviewed his images and talked about him at catheterization conference If no dramatic improvement with Lasix as prescribed, He may want to undergo fixing the other blockages This will be arranged with Dr. [name]. 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  There was also possible significant proximal RCA disease.  If he is still not feeling back to baseline, it is probably worth proceeding with relook angiography to see if we need to stent the right coronary artery.  We did review his case today during cath conference and that was the consensus.  Thanks.   ----- Message ----- From: Minna Merritts, MD Sent: 01/20/2021   6:04 PM EDT To: Wellington Hampshire, MD  This is a gentleman with the other residual disease who is short of breath, significant anxiety Thx TG

## 2021-01-22 NOTE — Telephone Encounter (Signed)
Dr. [name],   Dr. Rockey Situ had on his AVS to see him in 3 months. Do you think he needs and office visit in the next 3 weeks to reassess his symptoms.  If he needs to be cathed, he may fall out of the 30 day window for an updated H&P.   Thoughts?

## 2021-01-23 NOTE — Telephone Encounter (Signed)
Yes, he needs a follow-up visit in the next few weeks to reevaluate his symptoms.  If he still having chest pain and shortness of breath, he will require repeat cardiac catheterization.

## 2021-01-23 NOTE — Telephone Encounter (Signed)
LMOV to schedule  

## 2021-01-26 ENCOUNTER — Ambulatory Visit: Payer: Self-pay | Admitting: Physician Assistant

## 2021-01-27 NOTE — Telephone Encounter (Signed)
Dr. [name], just an FYI. The patient is scheduled to see Cadence on 02/04/21.

## 2021-01-29 ENCOUNTER — Ambulatory Visit: Payer: Self-pay | Admitting: Nurse Practitioner

## 2021-02-02 NOTE — H&P (View-Only) (Signed)
Cardiology Office Note:    Date:  02/04/2021   ID:  Darryl Gilbert, Darryl Gilbert 1955/06/12, MRN [id number]  PCP:  Lynnell Jude, MD  [facility] Cardiologist:  [name], MD  [facility] Electrophysiologist:  Virl Axe, MD   Referring MD: Lynnell Jude, MD   Chief Complaint: 1 month follow-up  History of Present Illness:    Darryl Gilbert is a 65 y.o. male with a hx of CAD in the setting of inferior ST elevation MI on 12/20/2020 status post PCI/DES to the LCx complicated by V. tach/V. fib/torsades to points, HFrEF secondary to post MI ICM, A. fib, HTN, HLD, hyperglycemia, prior tobacco use, and chronic back pain who presents for complex hospital follow-up after recent admission to [facility] on 9/3 through 9/9 as outlined below.   He was recently admitted to [facility] on 12/20/2020 with an inferior STEMI complicated by acute HFrEF secondary to post MI ICM, V. tach/V. fib/torsades to points in the setting of marked QT prolongation, IV amiodarone, and MI, PAF with RVR, and renal dysfunction.  Upon presentation, he underwent emergent LHC which showed codominant coronary arteries with thrombotic occlusion of the proximal LCx which was the culprit for the inferior ST EMI.  There was also 70% stenosis in the ostial RCA which had a high anterior takeoff.  Catheter induced spasm in the ostial right coronary artery was unable to be completely excluded.  He underwent successful PCI/DES to the LCx.  Initial presenting rhythm was A. fib, though he converted to sinus rhythm by the end of his LHC with amiodarone drip.  Echo post PCI on 9/4, demonstrated an EF of 50%, mild LVH, moderate hypokinesis of the entire inferolateral wall, grade 2 diastolic dysfunction, normal RV systolic function and ventricular cavity size, and no significant valvular abnormalities.  Post PCI on 9/5, he began to develop frequent PVCs along with runs of NSVT.  He was asymptomatic with this.  He then went into polymorphic VT that degenerated into  ventricular fibrillation which required multiple shocks.  He was intubated for airway protection.  Twelve-lead EKG showed no significant ST elevation, though QT interval was significantly prolonged at greater than 550 ms.  His presentation was consistent with torsades to points.  Given this, amiodarone was discontinued.  He received 2 g of magnesium.  He then went into SVT converting to sinus rhythm with IV metoprolol.  With discontinuation of amiodarone, QT interval continued to improve throughout his hospital stay and he had no further episodes of significant ventricular arrhythmias.  Throughout his stay he was noted to have frequent PACs and short runs of atrial tachycardia.  After discussion with EP, he was briefly placed on a lidocaine drip.  Interventional cardiology indicated there was no need for relook angiography given his event did not seem to be mediated by stent thrombosis as he had no complaints of chest pain or anginal symptoms and his EKG showed no ST elevation.  QT prolonging medications were recommended to be avoided.  Repeat limited echo following the above on 9/5 demonstrated an EF of 45 to 50%, hypokinesis of the inferolateral wall, mild LVH, normal RV systolic function and ventricular cavity size.  Last seen 01/20/2021 and reported increasing shortness of breath for about 1 to 2 weeks and atypical chest pain.  He feels anxiety denies chest.  Under stress since hospital events. Could not do cardiac rehab due to cost.  Cath images were reviewed and walking program was recommended.  Today, the patient reports  some shortness of breath and chest discomfort. He feels short of breath a lot. Says he is waking of short of breath and unable to lay flat. No LLE. HE takes lasix every 20mg  daily. He elevates his feet. Doesn't wear compression socks. Has the same chest pain as before, unchanged since the last visit. Feels heaviness in his chest. He is not doing cardiac rehab. He works from home. Feels  tired when he walks. Has fatigue. Diet could be better. Try to stay away from fried foods. No bleeding issues with aspirin and Effient. EKG with SB and no new ischemic changes.   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    Hyperlipidemia LDL goal <70    Hypertension    Ischemic cardiomyopathy     Past Surgical History:  Procedure Laterality Date   CORONARY/GRAFT ACUTE MI REVASCULARIZATION N/A 12/20/2020   Procedure: Coronary/Graft Acute MI Revascularization;  Surgeon: Wellington Hampshire, MD;  Location: [place] [facility];  Service: Cardiovascular;  Laterality: N/A;   LEFT HEART CATH AND CORONARY ANGIOGRAPHY N/A 12/20/2020   Procedure: LEFT HEART CATH AND CORONARY ANGIOGRAPHY;  Surgeon: Wellington Hampshire, MD;  Location: [place] [facility];  Service: Cardiovascular;  Laterality: N/A;    Current Medications: Current Meds  Medication Sig   aspirin 81 MG chewable tablet Chew 1 tablet (81 mg total) by mouth daily.   atorvastatin (LIPITOR) 80 MG tablet Take 1 tablet (80 mg total) by mouth daily.   esomeprazole (NEXIUM) 20 MG capsule Take 1 capsule (20 mg total) by mouth daily at 12 noon.   furosemide (LASIX) 20 MG tablet Take 1 tablet (20 mg total) by mouth daily as needed. For shortness of breath and swelling   metoprolol tartrate (LOPRESSOR) 25 MG tablet Take 0.5 tablets (12.5 mg total) by mouth 2 (two) times daily.   nitroGLYCERIN (NITROSTAT) 0.4 MG SL tablet Place 1 tablet (0.4 mg total) under the tongue every 5 (five) minutes as needed for chest pain.   potassium chloride (KLOR-CON) 10 MEQ tablet Take 1 tablet (10 mEq total) by mouth daily as needed.   prasugrel (EFFIENT) 10 MG TABS tablet Take 1 tablet (10 mg total) by mouth daily.   sacubitril-valsartan (ENTRESTO) 24-26 MG Take 1 tablet by mouth 2 (two) times daily.    [DISCONTINUED] losartan (COZAAR) 25 MG tablet Take 1 tablet (25 mg total) by mouth daily.   [DISCONTINUED] metoprolol tartrate (LOPRESSOR) 25 MG tablet Take 1 tablet (25 mg total) by mouth 2 (two) times daily.   Current Facility-Administered Medications for the 02/04/21 encounter (Office Visit) with Kathlen Mody, Haston Casebolt H, PA-C  Medication   sodium chloride flush (NS) 0.9 % injection 3 mL     Allergies:   Amiodarone   Social History   Socioeconomic History   Marital status: Divorced    Spouse name: Not on file   Number of children: Not on file   Years of education: Not on file   Highest education level: Not on file  Occupational History   Not on file  Tobacco Use   Smoking status: Every Day   Smokeless tobacco: Never  Substance and Sexual Activity   Alcohol use: Not on file   Drug use: Not on file   Sexual activity: Not on file  Other Topics Concern   Not on file  Social History Narrative   Not on file   Social Determinants of Health   Financial Resource Strain: Not on file  Food Insecurity: Not on file  Transportation Needs: Not on file  Physical Activity: Not on file  Stress: Not on file  Social Connections: Not on file     Family History: The patient's family history is not on file.  ROS:   Please see the history of present illness.     All other systems reviewed and are negative.  EKGs/Labs/Other Studies Reviewed:    The following studies were reviewed today:  Echo limited 12/22/20  1. Limited study   2. Left ventricular ejection fraction, by estimation, is 45 to 50%. The  left ventricle has mildly decreased function. The left ventricle  demonstrates regional wall motion abnormalities (Hypokinesis of the  inferolateral wall). There is mild left  ventricular hypertrophy.   3. Right ventricular systolic function is normal. The right ventricular  size is normal. Tricuspid regurgitation signal is inadequate for assessing  PA pressure.   Echo 12/21/20 1. Left  ventricular ejection fraction, by estimation, is 50%. The left  ventricle has low normal function. The left ventricle demonstrates  regional wall motion abnormalities (see scoring diagram/findings for  description). There is mild left ventricular  hypertrophy. Left ventricular diastolic parameters are consistent with  Grade II diastolic dysfunction (pseudonormalization). There is moderate  hypokinesis of the left ventricular, entire inferolateral wall.   2. Right ventricular systolic function is normal. The right ventricular  size is normal.   3. The mitral valve is normal in structure. No evidence of mitral valve  regurgitation.   4. The aortic valve is tricuspid. Aortic valve regurgitation is not  visualized.   Cardiac cath 12/20/20   Ost RCA to Prox RCA lesion is 70% stenosed.   Prox Cx to Mid Cx lesion is 100% stenosed.   1st Mrg lesion is 60% stenosed.   A drug-eluting stent was successfully placed using a STENT ONYX FRONTIER 3.0X22.   Post intervention, there is a 0% residual stenosis.   There is moderate left ventricular systolic dysfunction.   LV end diastolic pressure is moderately elevated.   The left ventricular ejection fraction is 35-45% by visual estimate.   1.  Codominant coronary arteries with thrombotic occlusion of the proximal left circumflex which is the culprit for inferior STEMI.  There is also 70% stenosis in ostial right coronary artery which has high anterior takeoff.  Catheter induced spasm in the ostial right coronary artery cannot be completely excluded. 2.  Moderately reduced LV systolic function with segmental wall motion abnormalities in the left circumflex distribution.  Moderately elevated left ventricular end-diastolic pressure at 22 mmHg. 3.  Successful angioplasty and drug-eluting stent placement to the left circumflex.   Recommendations: Dual antiplatelet therapy for at least 12 months. Aggressive treatment of risk factors and smoking cessation. The  patient had atrial fibrillation on presentation but converted sinus rhythm at the end of the case with amiodarone drip.  Continue amiodarone drip for now and monitor rhythm closely.   EKG:  EKG is  ordered today.  The ekg ordered today demonstrates SB, 52bpm, PVCQtc 418ms, TWI inferolateral leads  Recent Labs: 12/21/2020: TSH 1.189 12/22/2020: ALT 52 12/31/2020: BUN 20; Creatinine, Ser 1.40; Hemoglobin 14.4; Magnesium 2.1; Platelets 306; Potassium 4.4; Sodium 139  Recent Lipid Panel    Component Value Date/Time   CHOL 215 (H) 12/21/2020 0543   TRIG 270 (H) 12/21/2020 0543   HDL 27 (L)  12/21/2020 0543   CHOLHDL 8.0 12/21/2020 0543   VLDL 54 (H) 12/21/2020 0543   LDLCALC 134 (H) 12/21/2020 0543    Physical Exam:    VS:  BP 120/70 (BP Location: Left Arm, Patient Position: Sitting, Cuff Size: Normal)   Pulse (!) 52   Ht 6\' 1"  (1.854 m)   Wt 205 lb (93 kg)   SpO2 97%   BMI 27.05 kg/m     Wt Readings from Last 3 Encounters:  02/04/21 205 lb (93 kg)  01/20/21 203 lb (92.1 kg)  12/31/20 194 lb (88 kg)     GEN:  Well nourished, well developed in no acute distress HEENT: Normal NECK: No JVD; No carotid bruits LYMPHATICS: No lymphadenopathy CARDIAC: bradycardia, RR, no murmurs, rubs, gallops RESPIRATORY:  Clear to auscultation without rales, wheezing or rhonchi  ABDOMEN: Soft, non-tender, non-distended MUSCULOSKELETAL:  No edema; No deformity  SKIN: Warm and dry NEUROLOGIC:  Alert and oriented x 3 PSYCHIATRIC:  Normal affect   ASSESSMENT:    1. Chest pain of uncertain etiology   2. Coronary artery disease of native artery of native heart with stable angina pectoris ([place])   3. Hyperlipidemia, mixed   4. Essential hypertension   5. HFrEF (heart failure with reduced ejection fraction) (HCC)   6. Paroxysmal atrial fibrillation ([place])   7. Abnormal liver function test   8. Renal dysfunction   9. Smoking    PLAN:    In order of problems listed above:  CAD with STEMI s/p DES  Lcx Patient reports persistent shortness of breath and chest pain. Cath showed 70% ost to prox RCA lesion, which was previously reviewed by Dr. [name] and recommended repeat cath for refractory symptoms. BP was too soft to add Nitrates. EKG with SB and no new changes. Given persistent anginal symptoms will set him up for repeat LHC. Continue DAPT with Aspirin and Effient. Continue statin, BB, SL NTG. I will decrease Lopressor due to bradycardia with heart rate of 52bpm with complaints of generalized fatigue. Lifestyle changes discussed in detail today.   HFrEF Limited echo 9/5 showed LVEF 45-50%, mild LVH. Patient reports persistent shortness of breath, orthopnea and pnd. No LLE on exam and lungs clear. He is on lasix 20mg  daily with potassium. No overt signs of volume overload. Continue Lopressor. Stop Losartan and start Entresto 24-26mg  BID. BMET at follow-up. Continue GDMT as tolerated. Will also check BNP. If unable to afford Delene Loll can go back to Losartan.   HLD Repeat lipid panel /LFTs today. Continue Lipitor 80mg  daily  HTN BP good today. Decrease Lopressor to 12.5mg BID as above. Transition Losartan to Entresto.   Prolonged Qtc EKG with stable Qtc, 462ms.   Tobacco use Patient quit smoking. He was congratulated.   PAF In the setting of MI. No further documented episodes. Should he have recurrence of Afib will likely need to revisit anticoagulation.    Disposition: Follow up in 3 week(s) with MD/APP   Shared Decision Making/Informed Consent   Shared Decision Making/Informed Consent The risks [stroke (1 in 1000), death (1 in 1000), kidney failure [usually temporary] (1 in 500), bleeding (1 in 200), allergic reaction [possibly serious] (1 in 200)], benefits (diagnostic support and management of coronary artery disease) and alternatives of a cardiac catheterization were discussed in detail with Mr. Coombs and he is willing to proceed.    Signed, Amiria Orrison Ninfa Meeker, PA-C  02/04/2021  11:23 AM    [facility]

## 2021-02-02 NOTE — Progress Notes (Addendum)
Cardiology Office Note:    Date:  02/04/2021   ID:  Theoplis, Garciagarcia [birth year], MRN [id number]  PCP:  Lynnell Jude, MD  [facility] Cardiologist:  [name], MD  [facility] Electrophysiologist:  Virl Axe, MD   Referring MD: Lynnell Jude, MD   Chief Complaint: 1 month follow-up  History of Present Illness:    Darryl Gilbert is a 65 y.o. male with a hx of CAD in the setting of inferior ST elevation MI on 12/20/2020 status post PCI/DES to the LCx complicated by V. tach/V. fib/torsades to points, HFrEF secondary to post MI ICM, A. fib, HTN, HLD, hyperglycemia, prior tobacco use, and chronic back pain who presents for complex hospital follow-up after recent admission to [facility] on 9/3 through 9/9 as outlined below.   He was recently admitted to [facility] on 12/20/2020 with an inferior STEMI complicated by acute HFrEF secondary to post MI ICM, V. tach/V. fib/torsades to points in the setting of marked QT prolongation, IV amiodarone, and MI, PAF with RVR, and renal dysfunction.  Upon presentation, he underwent emergent LHC which showed codominant coronary arteries with thrombotic occlusion of the proximal LCx which was the culprit for the inferior ST EMI.  There was also 70% stenosis in the ostial RCA which had a high anterior takeoff.  Catheter induced spasm in the ostial right coronary artery was unable to be completely excluded.  He underwent successful PCI/DES to the LCx.  Initial presenting rhythm was A. fib, though he converted to sinus rhythm by the end of his LHC with amiodarone drip.  Echo post PCI on 9/4, demonstrated an EF of 50%, mild LVH, moderate hypokinesis of the entire inferolateral wall, grade 2 diastolic dysfunction, normal RV systolic function and ventricular cavity size, and no significant valvular abnormalities.  Post PCI on 9/5, he began to develop frequent PVCs along with runs of NSVT.  He was asymptomatic with this.  He then went into polymorphic VT that degenerated into  ventricular fibrillation which required multiple shocks.  He was intubated for airway protection.  Twelve-lead EKG showed no significant ST elevation, though QT interval was significantly prolonged at greater than 550 ms.  His presentation was consistent with torsades to points.  Given this, amiodarone was discontinued.  He received 2 g of magnesium.  He then went into SVT converting to sinus rhythm with IV metoprolol.  With discontinuation of amiodarone, QT interval continued to improve throughout his hospital stay and he had no further episodes of significant ventricular arrhythmias.  Throughout his stay he was noted to have frequent PACs and short runs of atrial tachycardia.  After discussion with EP, he was briefly placed on a lidocaine drip.  Interventional cardiology indicated there was no need for relook angiography given his event did not seem to be mediated by stent thrombosis as he had no complaints of chest pain or anginal symptoms and his EKG showed no ST elevation.  QT prolonging medications were recommended to be avoided.  Repeat limited echo following the above on 9/5 demonstrated an EF of 45 to 50%, hypokinesis of the inferolateral wall, mild LVH, normal RV systolic function and ventricular cavity size.  Last seen 01/20/2021 and reported increasing shortness of breath for about 1 to 2 weeks and atypical chest pain.  He feels anxiety denies chest.  Under stress since hospital events. Could not do cardiac rehab due to cost.  Cath images were reviewed and walking program was recommended.  Today, the patient reports  some shortness of breath and chest discomfort. He feels short of breath a lot. Says he is waking of short of breath and unable to lay flat. No LLE. HE takes lasix every 20mg  daily. He elevates his feet. Doesn't wear compression socks. Has the same chest pain as before, unchanged since the last visit. Feels heaviness in his chest. He is not doing cardiac rehab. He works from home. Feels  tired when he walks. Has fatigue. Diet could be better. Try to stay away from fried foods. No bleeding issues with aspirin and Effient. EKG with SB and no new ischemic changes.   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    Hyperlipidemia LDL goal <70    Hypertension    Ischemic cardiomyopathy     Past Surgical History:  Procedure Laterality Date   CORONARY/GRAFT ACUTE MI REVASCULARIZATION N/A 12/20/2020   Procedure: Coronary/Graft Acute MI Revascularization;  Surgeon: Wellington Hampshire, MD;  Location: [facility];  Service: Cardiovascular;  Laterality: N/A;   LEFT HEART CATH AND CORONARY ANGIOGRAPHY N/A 12/20/2020   Procedure: LEFT HEART CATH AND CORONARY ANGIOGRAPHY;  Surgeon: Wellington Hampshire, MD;  Location: [facility];  Service: Cardiovascular;  Laterality: N/A;    Current Medications: Current Meds  Medication Sig   aspirin 81 MG chewable tablet Chew 1 tablet (81 mg total) by mouth daily.   atorvastatin (LIPITOR) 80 MG tablet Take 1 tablet (80 mg total) by mouth daily.   esomeprazole (NEXIUM) 20 MG capsule Take 1 capsule (20 mg total) by mouth daily at 12 noon.   furosemide (LASIX) 20 MG tablet Take 1 tablet (20 mg total) by mouth daily as needed. For shortness of breath and swelling   metoprolol tartrate (LOPRESSOR) 25 MG tablet Take 0.5 tablets (12.5 mg total) by mouth 2 (two) times daily.   nitroGLYCERIN (NITROSTAT) 0.4 MG SL tablet Place 1 tablet (0.4 mg total) under the tongue every 5 (five) minutes as needed for chest pain.   potassium chloride (KLOR-CON) 10 MEQ tablet Take 1 tablet (10 mEq total) by mouth daily as needed.   prasugrel (EFFIENT) 10 MG TABS tablet Take 1 tablet (10 mg total) by mouth daily.   sacubitril-valsartan (ENTRESTO) 24-26 MG Take 1 tablet by mouth 2 (two) times daily.    [DISCONTINUED] losartan (COZAAR) 25 MG tablet Take 1 tablet (25 mg total) by mouth daily.   [DISCONTINUED] metoprolol tartrate (LOPRESSOR) 25 MG tablet Take 1 tablet (25 mg total) by mouth 2 (two) times daily.   Current Facility-Administered Medications for the 02/04/21 encounter (Office Visit) with Kathlen Mody, Evangelynn Lochridge H, PA-C  Medication   sodium chloride flush (NS) 0.9 % injection 3 mL     Allergies:   Amiodarone   Social History   Socioeconomic History   Marital status: Divorced    Spouse name: Not on file   Number of children: Not on file   Years of education: Not on file   Highest education level: Not on file  Occupational History   Not on file  Tobacco Use   Smoking status: Every Day   Smokeless tobacco: Never  Substance and Sexual Activity   Alcohol use: Not on file   Drug use: Not on file   Sexual activity: Not on file  Other Topics Concern   Not on file  Social History Narrative   Not on file   Social Determinants of Health   Financial Resource Strain: Not on file  Food Insecurity: Not on file  Transportation Needs: Not on file  Physical Activity: Not on file  Stress: Not on file  Social Connections: Not on file     Family History: The patient's family history is not on file.  ROS:   Please see the history of present illness.     All other systems reviewed and are negative.  EKGs/Labs/Other Studies Reviewed:    The following studies were reviewed today:  Echo limited 12/22/20  1. Limited study   2. Left ventricular ejection fraction, by estimation, is 45 to 50%. The  left ventricle has mildly decreased function. The left ventricle  demonstrates regional wall motion abnormalities (Hypokinesis of the  inferolateral wall). There is mild left  ventricular hypertrophy.   3. Right ventricular systolic function is normal. The right ventricular  size is normal. Tricuspid regurgitation signal is inadequate for assessing  PA pressure.   Echo 12/21/20 1. Left  ventricular ejection fraction, by estimation, is 50%. The left  ventricle has low normal function. The left ventricle demonstrates  regional wall motion abnormalities (see scoring diagram/findings for  description). There is mild left ventricular  hypertrophy. Left ventricular diastolic parameters are consistent with  Grade II diastolic dysfunction (pseudonormalization). There is moderate  hypokinesis of the left ventricular, entire inferolateral wall.   2. Right ventricular systolic function is normal. The right ventricular  size is normal.   3. The mitral valve is normal in structure. No evidence of mitral valve  regurgitation.   4. The aortic valve is tricuspid. Aortic valve regurgitation is not  visualized.   Cardiac cath 12/20/20   Ost RCA to Prox RCA lesion is 70% stenosed.   Prox Cx to Mid Cx lesion is 100% stenosed.   1st Mrg lesion is 60% stenosed.   A drug-eluting stent was successfully placed using a STENT ONYX FRONTIER 3.0X22.   Post intervention, there is a 0% residual stenosis.   There is moderate left ventricular systolic dysfunction.   LV end diastolic pressure is moderately elevated.   The left ventricular ejection fraction is 35-45% by visual estimate.   1.  Codominant coronary arteries with thrombotic occlusion of the proximal left circumflex which is the culprit for inferior STEMI.  There is also 70% stenosis in ostial right coronary artery which has high anterior takeoff.  Catheter induced spasm in the ostial right coronary artery cannot be completely excluded. 2.  Moderately reduced LV systolic function with segmental wall motion abnormalities in the left circumflex distribution.  Moderately elevated left ventricular end-diastolic pressure at 22 mmHg. 3.  Successful angioplasty and drug-eluting stent placement to the left circumflex.   Recommendations: Dual antiplatelet therapy for at least 12 months. Aggressive treatment of risk factors and smoking cessation. The  patient had atrial fibrillation on presentation but converted sinus rhythm at the end of the case with amiodarone drip.  Continue amiodarone drip for now and monitor rhythm closely.   EKG:  EKG is  ordered today.  The ekg ordered today demonstrates SB, 52bpm, PVCQtc 468ms, TWI inferolateral leads  Recent Labs: 12/21/2020: TSH 1.189 12/22/2020: ALT 52 12/31/2020: BUN 20; Creatinine, Ser 1.40; Hemoglobin 14.4; Magnesium 2.1; Platelets 306; Potassium 4.4; Sodium 139  Recent Lipid Panel    Component Value Date/Time   CHOL 215 (H) 12/21/2020 0543   TRIG 270 (H) 12/21/2020 0543   HDL 27 (L)  12/21/2020 0543   CHOLHDL 8.0 12/21/2020 0543   VLDL 54 (H) 12/21/2020 0543   LDLCALC 134 (H) 12/21/2020 0543    Physical Exam:    VS:  BP 120/70 (BP Location: Left Arm, Patient Position: Sitting, Cuff Size: Normal)   Pulse (!) 52   Ht 6\' 1"  (1.854 m)   Wt 205 lb (93 kg)   SpO2 97%   BMI 27.05 kg/m     Wt Readings from Last 3 Encounters:  02/04/21 205 lb (93 kg)  01/20/21 203 lb (92.1 kg)  12/31/20 194 lb (88 kg)     GEN:  Well nourished, well developed in no acute distress HEENT: Normal NECK: No JVD; No carotid bruits LYMPHATICS: No lymphadenopathy CARDIAC: bradycardia, RR, no murmurs, rubs, gallops RESPIRATORY:  Clear to auscultation without rales, wheezing or rhonchi  ABDOMEN: Soft, non-tender, non-distended MUSCULOSKELETAL:  No edema; No deformity  SKIN: Warm and dry NEUROLOGIC:  Alert and oriented x 3 PSYCHIATRIC:  Normal affect   ASSESSMENT:    1. Chest pain of uncertain etiology   2. Coronary artery disease of native artery of native heart with stable angina pectoris ([place])   3. Hyperlipidemia, mixed   4. Essential hypertension   5. HFrEF (heart failure with reduced ejection fraction) (HCC)   6. Paroxysmal atrial fibrillation ([place])   7. Abnormal liver function test   8. Renal dysfunction   9. Smoking    PLAN:    In order of problems listed above:  CAD with STEMI s/p DES  Lcx Patient reports persistent shortness of breath and chest pain. Cath showed 70% ost to prox RCA lesion, which was previously reviewed by Dr. [name] and recommended repeat cath for refractory symptoms. BP was too soft to add Nitrates. EKG with SB and no new changes. Given persistent anginal symptoms will set him up for repeat LHC. Continue DAPT with Aspirin and Effient. Continue statin, BB, SL NTG. I will decrease Lopressor due to bradycardia with heart rate of 52bpm with complaints of generalized fatigue. Lifestyle changes discussed in detail today.   HFrEF Limited echo 9/5 showed LVEF 45-50%, mild LVH. Patient reports persistent shortness of breath, orthopnea and pnd. No LLE on exam and lungs clear. He is on lasix 20mg  daily with potassium. No overt signs of volume overload. Continue Lopressor. Stop Losartan and start Entresto 24-26mg  BID. BMET at follow-up. Continue GDMT as tolerated. Will also check BNP. If unable to afford Delene Loll can go back to Losartan.   HLD Repeat lipid panel /LFTs today. Continue Lipitor 80mg  daily  HTN BP good today. Decrease Lopressor to 12.5mg BID as above. Transition Losartan to Entresto.   Prolonged Qtc EKG with stable Qtc, 430ms.   Tobacco use Patient quit smoking. He was congratulated.   PAF In the setting of MI. No further documented episodes. Should he have recurrence of Afib will likely need to revisit anticoagulation.    Disposition: Follow up in 3 week(s) with MD/APP   Shared Decision Making/Informed Consent   Shared Decision Making/Informed Consent The risks [stroke (1 in 1000), death (1 in 1000), kidney failure [usually temporary] (1 in 500), bleeding (1 in 200), allergic reaction [possibly serious] (1 in 200)], benefits (diagnostic support and management of coronary artery disease) and alternatives of a cardiac catheterization were discussed in detail with Mr. Blackshire and he is willing to proceed.    Signed, Lillianna Sabel Ninfa Meeker, PA-C  02/04/2021  11:23 AM    [facility]

## 2021-02-04 ENCOUNTER — Other Ambulatory Visit: Payer: Self-pay

## 2021-02-04 ENCOUNTER — Encounter: Payer: Self-pay | Admitting: Medical

## 2021-02-04 ENCOUNTER — Telehealth: Payer: Self-pay | Admitting: Medical

## 2021-02-04 ENCOUNTER — Ambulatory Visit (INDEPENDENT_AMBULATORY_CARE_PROVIDER_SITE_OTHER): Payer: Self-pay | Admitting: Medical

## 2021-02-04 VITALS — BP 120/70 | HR 52 | Ht 73.0 in | Wt 205.0 lb

## 2021-02-04 DIAGNOSIS — R079 Chest pain, unspecified: Secondary | ICD-10-CM

## 2021-02-04 DIAGNOSIS — I480 Paroxysmal atrial fibrillation: Secondary | ICD-10-CM

## 2021-02-04 DIAGNOSIS — F17200 Nicotine dependence, unspecified, uncomplicated: Secondary | ICD-10-CM

## 2021-02-04 DIAGNOSIS — I10 Essential (primary) hypertension: Secondary | ICD-10-CM

## 2021-02-04 DIAGNOSIS — I5020 Unspecified systolic (congestive) heart failure: Secondary | ICD-10-CM

## 2021-02-04 DIAGNOSIS — I25118 Atherosclerotic heart disease of native coronary artery with other forms of angina pectoris: Secondary | ICD-10-CM

## 2021-02-04 DIAGNOSIS — E782 Mixed hyperlipidemia: Secondary | ICD-10-CM

## 2021-02-04 DIAGNOSIS — N289 Disorder of kidney and ureter, unspecified: Secondary | ICD-10-CM

## 2021-02-04 DIAGNOSIS — R7989 Other specified abnormal findings of blood chemistry: Secondary | ICD-10-CM

## 2021-02-04 MED ORDER — SODIUM CHLORIDE 0.9% FLUSH: 3.0000 mL | Freq: Two times a day (BID) | INTRAVENOUS | Status: DC

## 2021-02-04 MED ORDER — ENTRESTO 24-26 MG PO TABS: 1.0000 | ORAL_TABLET | Freq: Two times a day (BID) | ORAL | 6 refills | Status: DC

## 2021-02-04 MED ORDER — METOPROLOL TARTRATE 25 MG PO TABS: 12.5000 mg | ORAL_TABLET | Freq: Two times a day (BID) | ORAL | 3 refills | Status: DC

## 2021-02-04 NOTE — Progress Notes (Signed)
Patient started on Entresto 24-26 mg today at appointment. Provided samples as listed below and also completed patient assistance application.   Medication Samples have been provided to the patient.  Drug name: Delene Loll        Strength: 24-26 mg         Qty: 2 bottles   LOT: [id number]   Exp.Date: 9/24

## 2021-02-04 NOTE — Telephone Encounter (Signed)
Called patient to confirm if he has any insurance. Patient assistance application completed but we do not have anything on file. Left voicemail message for patient to call back to review this information.

## 2021-02-04 NOTE — Telephone Encounter (Signed)
Spoke with patient and he reports that medication costs roughly $600.00 + and he is not able to afford that. We did provide samples of medication and application completed. Advised that I would get that faxed in and if not approved then we can review if changes need to be made at that time. He was agreeable with this plan and had no further questions.

## 2021-02-04 NOTE — Telephone Encounter (Signed)
Patient states Darryl Gilbert is too expensive for him . Please advise if there is an alternative, or if he should stay on his Losartan

## 2021-02-04 NOTE — Telephone Encounter (Signed)
Spoke with patient and inquired if he had any insurance. He confirmed that he does not have insurance and advised I would update his application and be in touch with the outcome for assistance.

## 2021-02-04 NOTE — Patient Instructions (Addendum)
Medication Instructions:  Your physician has recommended you make the following change in your medication:   DECREASE Metoprolol tartrate 25 mg and take 1/2 tablet (12.5 mg) twice a day STOP Losartan START Entresto 24-26 mg twice a day  *If you need a refill on your cardiac medications before your next appointment, please call your pharmacy*   Lab Work: CBC, BMET, BNP, Lipid panel, and liver function test.   If you have labs (blood work) drawn today and your tests are completely normal, you will receive your results only by: [facility] (if you have MyChart) OR A paper copy in the mail If you have any lab test that is abnormal or we need to change your treatment, we will call you to review the results.   Testing/Procedures: [facility] Cardiac Cath Instructions  You are scheduled for a Cardiac Cath on: Monday November 7th at 09:30 am with Dr. [name] Please arrive at 08:30 am on the day of your procedure Please expect a call from our [facility] to pre-register you Do not eat/drink anything after midnight Someone will need to drive you home It is recommended someone be with you for the first 24 hours after your procedure Wear clothes that are easy to get on/off and wear slip on shoes if possible   Medications bring a current list of all medications with you  _XX__ Do not take these medications before your procedure: HOLD furosemide the morning of your procedure.   _XX__ You may take all of your other medications the morning of your procedure with enough water to swallow safely  Day of your procedure: Arrive at the [facility] entrance.  Free valet service is available.  After entering the [facility] please check-in at the registration desk (1st desk on your right) to receive your armband. After receiving your armband someone will escort you to the cardiac cath/special procedures waiting area.  The usual length of stay after your procedure is about 2 to 3  hours.  This can vary.  If you have any questions, please call our office at [phone], or you may call the cardiac cath lab at [facility] directly at [phone]    Follow-Up: At [facility], you and your health needs are our priority.  As part of our continuing mission to provide you with exceptional heart care, we have created designated Provider Care Teams.  These Care Teams include your primary Cardiologist (physician) and Advanced Practice Providers (APPs -  Physician Assistants and Nurse Practitioners) who all work together to provide you with the care you need, when you need it.   Your next appointment:   3 week(s)  The format for your next appointment:   In Person  Provider:   You may see [name], MD or one of the following Advanced Practice Providers on your designated Care Team:   Murray Hodgkins, NP Christell Faith, PA-C Marrianne Mood, PA-C Cadence [place], [place]

## 2021-02-04 NOTE — Addendum Note (Signed)
Addended by: Antony Madura on: 02/04/2021 11:29 AM   Modules accepted: Orders, SmartSet

## 2021-02-05 LAB — LIPID PANEL
Chol/HDL Ratio: 4.4 ratio (ref 0.0–5.0)
Cholesterol, Total: 166 mg/dL (ref 100–199)
HDL: 38 mg/dL — ABNORMAL LOW (ref >39)
LDL Chol Calc (NIH): 102 mg/dL — ABNORMAL HIGH (ref 0–99)
Triglycerides: 148 mg/dL (ref 0–149)
VLDL Cholesterol Cal: 26 mg/dL (ref 5–40)

## 2021-02-05 LAB — CBC
Hematocrit: 42.5 % (ref 37.5–51.0)
Hemoglobin: 14.5 g/dL (ref 13.0–17.7)
MCH: 31.3 pg (ref 26.6–33.0)
MCHC: 34.1 g/dL (ref 31.5–35.7)
MCV: 92 fL (ref 79–97)
Platelets: 225 10*3/uL (ref 150–450)
RBC: 4.64 x10E6/uL (ref 4.14–5.80)
RDW: 13.1 % (ref 11.6–15.4)
WBC: 7.6 10*3/uL (ref 3.4–10.8)

## 2021-02-05 LAB — HEPATIC FUNCTION PANEL
ALT: 25 IU/L (ref 0–44)
AST: 16 IU/L (ref 0–40)
Albumin: 4.5 g/dL (ref 3.8–4.8)
Alkaline Phosphatase: 82 IU/L (ref 44–121)
Bilirubin Total: 0.4 mg/dL (ref 0.0–1.2)
Bilirubin, Direct: <0.1 mg/dL (ref 0.00–0.40)
Total Protein: 7.8 g/dL (ref 6.0–8.5)

## 2021-02-05 LAB — BASIC METABOLIC PANEL
BUN/Creatinine Ratio: 18 (ref 10–24)
BUN: 24 mg/dL (ref 8–27)
CO2: 24 mmol/L (ref 20–29)
Calcium: 9.8 mg/dL (ref 8.6–10.2)
Chloride: 105 mmol/L (ref 96–106)
Creatinine, Ser: 1.32 mg/dL — ABNORMAL HIGH (ref 0.76–1.27)
Glucose: 101 mg/dL — ABNORMAL HIGH (ref 70–99)
Potassium: 4.8 mmol/L (ref 3.5–5.2)
Sodium: 142 mmol/L (ref 134–144)
eGFR: 60 mL/min/{1.73_m2} (ref >59)

## 2021-02-05 LAB — BRAIN NATRIURETIC PEPTIDE: BNP: 76.5 pg/mL (ref 0.0–100.0)

## 2021-02-09 ENCOUNTER — Ambulatory Visit: Admit: 2021-02-09 | Payer: Self-pay | Admitting: Cardiovascular Disease

## 2021-02-09 ENCOUNTER — Ambulatory Visit: Payer: Self-pay | Admitting: Physician Assistant

## 2021-02-09 SURGERY — LEFT HEART CATH AND CORONARY ANGIOGRAPHY
Anesthesia: Moderate Sedation

## 2021-02-10 ENCOUNTER — Telehealth: Payer: Self-pay | Admitting: Medical

## 2021-02-10 NOTE — Telephone Encounter (Signed)
Fax received from [employer] stating the patient has been approved for [employer] patient assistance until 02/09/22 as longa as all program eligibility criteria continue to be met.   Copy of the fax has been placed in file bin for assistance applications.

## 2021-02-23 ENCOUNTER — Ambulatory Visit
Admission: RE | Admit: 2021-02-23 | Discharge: 2021-02-23 | Disposition: A | Discharge status: Home / Self Care | Payer: Self-pay | Source: Ambulatory Visit | Attending: Cardiovascular Disease | Admitting: Cardiovascular Disease

## 2021-02-23 ENCOUNTER — Encounter
Admission: RE | Disposition: A | Discharge status: Home / Self Care | Payer: Self-pay | Source: Ambulatory Visit | Attending: Cardiovascular Disease

## 2021-02-23 ENCOUNTER — Other Ambulatory Visit: Payer: Self-pay

## 2021-02-23 ENCOUNTER — Encounter: Payer: Self-pay | Admitting: Cardiovascular Disease

## 2021-02-23 DIAGNOSIS — I25119 Atherosclerotic heart disease of native coronary artery with unspecified angina pectoris: Secondary | ICD-10-CM

## 2021-02-23 DIAGNOSIS — E782 Mixed hyperlipidemia: Secondary | ICD-10-CM | POA: Insufficient documentation

## 2021-02-23 DIAGNOSIS — R079 Chest pain, unspecified: Secondary | ICD-10-CM

## 2021-02-23 DIAGNOSIS — Z87891 Personal history of nicotine dependence: Secondary | ICD-10-CM | POA: Insufficient documentation

## 2021-02-23 DIAGNOSIS — Z7982 Long term (current) use of aspirin: Secondary | ICD-10-CM | POA: Insufficient documentation

## 2021-02-23 DIAGNOSIS — I5022 Chronic systolic (congestive) heart failure: Secondary | ICD-10-CM | POA: Insufficient documentation

## 2021-02-23 DIAGNOSIS — I255 Ischemic cardiomyopathy: Secondary | ICD-10-CM | POA: Insufficient documentation

## 2021-02-23 DIAGNOSIS — Z955 Presence of coronary angioplasty implant and graft: Secondary | ICD-10-CM | POA: Insufficient documentation

## 2021-02-23 DIAGNOSIS — I110 Hypertensive heart disease with heart failure: Secondary | ICD-10-CM | POA: Insufficient documentation

## 2021-02-23 DIAGNOSIS — Z79899 Other long term (current) drug therapy: Secondary | ICD-10-CM | POA: Insufficient documentation

## 2021-02-23 DIAGNOSIS — R7989 Other specified abnormal findings of blood chemistry: Secondary | ICD-10-CM | POA: Insufficient documentation

## 2021-02-23 DIAGNOSIS — I25118 Atherosclerotic heart disease of native coronary artery with other forms of angina pectoris: Secondary | ICD-10-CM | POA: Insufficient documentation

## 2021-02-23 DIAGNOSIS — R001 Bradycardia, unspecified: Secondary | ICD-10-CM | POA: Insufficient documentation

## 2021-02-23 DIAGNOSIS — I252 Old myocardial infarction: Secondary | ICD-10-CM | POA: Insufficient documentation

## 2021-02-23 DIAGNOSIS — Z888 Allergy status to other drugs, medicaments and biological substances status: Secondary | ICD-10-CM | POA: Insufficient documentation

## 2021-02-23 DIAGNOSIS — R0602 Shortness of breath: Secondary | ICD-10-CM | POA: Insufficient documentation

## 2021-02-23 DIAGNOSIS — I480 Paroxysmal atrial fibrillation: Secondary | ICD-10-CM | POA: Insufficient documentation

## 2021-02-23 HISTORY — PX: LEFT HEART CATH AND CORONARY ANGIOGRAPHY: CATH118249

## 2021-02-23 SURGERY — LEFT HEART CATH AND CORONARY ANGIOGRAPHY
Anesthesia: Moderate Sedation

## 2021-02-23 MED ORDER — SODIUM CHLORIDE 0.9% FLUSH: 3.0000 mL | INTRAVENOUS | Status: DC | PRN

## 2021-02-23 MED ORDER — VERAPAMIL HCL 2.5 MG/ML IV SOLN
INTRAVENOUS | Status: DC | PRN
  Administered 2021-02-23: 2.5 mg via INTRA_ARTERIAL

## 2021-02-23 MED ORDER — SODIUM CHLORIDE 0.9 % WEIGHT BASED INFUSION
3.0000 mL/kg/h | INTRAVENOUS | Status: DC
  Administered 2021-02-23: 3 mL/kg/h via INTRAVENOUS

## 2021-02-23 MED ORDER — ASPIRIN 81 MG PO CHEW: 81.0000 mg | CHEWABLE_TABLET | ORAL | Status: DC

## 2021-02-23 MED ORDER — VERAPAMIL HCL 2.5 MG/ML IV SOLN
INTRAVENOUS | Status: AC
  Filled 2021-02-23: qty 2

## 2021-02-23 MED ORDER — SODIUM CHLORIDE 0.9% FLUSH: 3.0000 mL | Freq: Two times a day (BID) | INTRAVENOUS | Status: DC

## 2021-02-23 MED ORDER — LIDOCAINE HCL 1 % IJ SOLN
INTRAMUSCULAR | Status: AC
  Filled 2021-02-23: qty 20

## 2021-02-23 MED ORDER — SODIUM CHLORIDE 0.9 % IV SOLN: 250.0000 mL | INTRAVENOUS | Status: DC | PRN

## 2021-02-23 MED ORDER — ACETAMINOPHEN 325 MG PO TABS: 650.0000 mg | ORAL_TABLET | ORAL | Status: DC | PRN

## 2021-02-23 MED ORDER — MIDAZOLAM HCL 2 MG/2ML IJ SOLN
INTRAMUSCULAR | Status: DC | PRN
  Administered 2021-02-23 (×2): 1 mg via INTRAVENOUS

## 2021-02-23 MED ORDER — SODIUM CHLORIDE 0.9 % IV SOLN: INTRAVENOUS | Status: DC

## 2021-02-23 MED ORDER — HEPARIN SODIUM (PORCINE) 1000 UNIT/ML IJ SOLN
INTRAMUSCULAR | Status: AC
  Filled 2021-02-23: qty 1

## 2021-02-23 MED ORDER — HEPARIN SODIUM (PORCINE) 1000 UNIT/ML IJ SOLN
INTRAMUSCULAR | Status: DC | PRN
  Administered 2021-02-23: 4500 [IU] via INTRAVENOUS

## 2021-02-23 MED ORDER — SODIUM CHLORIDE 0.9 % WEIGHT BASED INFUSION: 1.0000 mL/kg/h | INTRAVENOUS | Status: DC

## 2021-02-23 MED ORDER — FENTANYL CITRATE (PF) 100 MCG/2ML IJ SOLN
INTRAMUSCULAR | Status: DC | PRN
  Administered 2021-02-23 (×2): 25 ug via INTRAVENOUS

## 2021-02-23 MED ORDER — MIDAZOLAM HCL 2 MG/2ML IJ SOLN
INTRAMUSCULAR | Status: AC
  Filled 2021-02-23: qty 2

## 2021-02-23 MED ORDER — IOHEXOL 350 MG/ML SOLN
INTRAVENOUS | Status: DC | PRN
  Administered 2021-02-23: 44 mL

## 2021-02-23 MED ORDER — HEPARIN (PORCINE) IN NACL 1000-0.9 UT/500ML-% IV SOLN
INTRAVENOUS | Status: AC
  Filled 2021-02-23: qty 1000

## 2021-02-23 MED ORDER — HEPARIN (PORCINE) IN NACL 1000-0.9 UT/500ML-% IV SOLN
INTRAVENOUS | Status: DC | PRN
  Administered 2021-02-23 (×2): 500 mL

## 2021-02-23 MED ORDER — ONDANSETRON HCL 4 MG/2ML IJ SOLN: 4.0000 mg | Freq: Four times a day (QID) | INTRAMUSCULAR | Status: DC | PRN

## 2021-02-23 MED ORDER — FENTANYL CITRATE (PF) 100 MCG/2ML IJ SOLN
INTRAMUSCULAR | Status: AC
  Filled 2021-02-23: qty 2

## 2021-02-23 SURGICAL SUPPLY — 9 items
CATH INFINITI 5FR JK (CATHETERS) ×2 IMPLANT
DEVICE RAD TR BAND REGULAR (VASCULAR PRODUCTS) ×2 IMPLANT
GLIDESHEATH SLEND SS 6F .021 (SHEATH) ×2 IMPLANT
GUIDEWIRE INQWIRE 1.5J.035X260 (WIRE) ×1 IMPLANT
INQWIRE 1.5J .035X260CM (WIRE) ×2
PACK CARDIAC CATH (CUSTOM PROCEDURE TRAY) ×2 IMPLANT
PROTECTION STATION PRESSURIZED (MISCELLANEOUS) ×2
SET ATX SIMPLICITY (MISCELLANEOUS) ×2 IMPLANT
STATION PROTECTION PRESSURIZED (MISCELLANEOUS) ×1 IMPLANT

## 2021-02-23 NOTE — Interval H&P Note (Signed)
History and Physical Interval Note:  02/23/2021 11:18 AM  Darryl Gilbert  has presented today for surgery, with the diagnosis of LT Heart Cath   CAD with stable angina.  The various methods of treatment have been discussed with the patient and family. After consideration of risks, benefits and other options for treatment, the patient has consented to  Procedure(s): LEFT HEART CATH AND CORONARY ANGIOGRAPHY (N/A) as a surgical intervention.  The patient's history has been reviewed, patient examined, no change in status, stable for surgery.  I have reviewed the patient's chart and labs.  Questions were answered to the patient's satisfaction.     [name]

## 2021-02-25 NOTE — Progress Notes (Signed)
Cardiology Office Note:    Date:  02/26/2021   ID:  Washington, Whedbee 09/14/1955, MRN [id number]  PCP:  Lynnell Jude, MD  [facility] Cardiologist:  [name], MD  [facility] Electrophysiologist:  Virl Axe, MD   Referring MD: Lynnell Jude, MD   Chief Complaint: 3 week s/p cath  History of Present Illness:    Darryl Gilbert is a 65 y.o. male with a hx of CAD in the setting of inferior ST elevation MI on 12/20/2020 status post PCI/DES to the LCx complicated by V. tach/V. fib/torsades to points, HFrEF secondary to post MI ICM, A. fib, HTN, HLD, hyperglycemia, prior tobacco use, and chronic back pain who presents for complex hospital follow-up after recent admission to [facility] on 9/3 through 9/9 as outlined below.   He was recently admitted to [facility] on 12/20/2020 with an inferior STEMI complicated by acute HFrEF secondary to post MI ICM, V. tach/V. fib/torsades to points in the setting of marked QT prolongation, IV amiodarone, and MI, PAF with RVR, and renal dysfunction.  Upon presentation, he underwent emergent LHC which showed codominant coronary arteries with thrombotic occlusion of the proximal LCx which was the culprit for the inferior ST EMI.  There was also 70% stenosis in the ostial RCA which had a high anterior takeoff.  Catheter induced spasm in the ostial right coronary artery was unable to be completely excluded.  He underwent successful PCI/DES to the LCx.  Initial presenting rhythm was A. fib, though he converted to sinus rhythm by the end of his LHC with amiodarone drip.  Echo post PCI on 9/4, demonstrated an EF of 50%, mild LVH, moderate hypokinesis of the entire inferolateral wall, grade 2 diastolic dysfunction, normal RV systolic function and ventricular cavity size, and no significant valvular abnormalities.  Post PCI on 9/5, he began to develop frequent PVCs along with runs of NSVT.  He was asymptomatic with this.  He then went into polymorphic VT that degenerated into  ventricular fibrillation which required multiple shocks.  He was intubated for airway protection.  Twelve-lead EKG showed no significant ST elevation, though QT interval was significantly prolonged at greater than 550 ms.  His presentation was consistent with torsades to points.  Given this, amiodarone was discontinued.  He received 2 g of magnesium.  He then went into SVT converting to sinus rhythm with IV metoprolol.  With discontinuation of amiodarone, QT interval continued to improve throughout his hospital stay and he had no further episodes of significant ventricular arrhythmias.  Throughout his stay he was noted to have frequent PACs and short runs of atrial tachycardia.  After discussion with EP, he was briefly placed on a lidocaine drip.  Interventional cardiology indicated there was no need for relook angiography given his event did not seem to be mediated by stent thrombosis as he had no complaints of chest pain or anginal symptoms and his EKG showed no ST elevation.  QT prolonging medications were recommended to be avoided.  Repeat limited echo following the above on 9/5 demonstrated an EF of 45 to 50%, hypokinesis of the inferolateral wall, mild LVH, normal RV systolic function and ventricular cavity size.   seen 01/20/2021 and reported increasing shortness of breath for about 1 to 2 weeks and atypical chest pain. Under stress since hospital events. Could not do cardiac rehab due to cost.  Cath images were reviewed and walking program was recommended.  Last seen 02/04/21 and reported chest pain and SOB. He  was set up for repeat LHC.   LHC showed patent Lcx stent wit no significant restenosis. Residual disease was noted to be 70% ostial stenosis that improved to 40%, LVEF 35-40%. LVEDP 70mmHg. Recommended continued medical therapy. He was bradycardic on presentation and metoprolol was stopped.   Today, the patient reports breathing is a little better since the cardiac cath. Feels stopping BB has  improved breathing/energy. Cath site, right radial stable. Reports some dizziness, occurs when he stands or changes positions. This is longstanding. BP good/mildly elevated.reports orthopnea and pnd. He is on lasix 20mg  daily. No LLE, palpitations. Cannot afford cardiac rehab. Orthostatics negative. Normal LVEDP by cath.    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    Hyperlipidemia LDL goal <70    Hypertension    Ischemic cardiomyopathy     Past Surgical History:  Procedure Laterality Date   CORONARY/GRAFT ACUTE MI REVASCULARIZATION N/A 12/20/2020   Procedure: Coronary/Graft Acute MI Revascularization;  Surgeon: Wellington Hampshire, MD;  Location: [place] [facility];  Service: Cardiovascular;  Laterality: N/A;   LEFT HEART CATH AND CORONARY ANGIOGRAPHY N/A 12/20/2020   Procedure: LEFT HEART CATH AND CORONARY ANGIOGRAPHY;  Surgeon: Wellington Hampshire, MD;  Location: [place] [facility];  Service: Cardiovascular;  Laterality: N/A;   LEFT HEART CATH AND CORONARY ANGIOGRAPHY N/A 02/23/2021   Procedure: LEFT HEART CATH AND CORONARY ANGIOGRAPHY;  Surgeon: Wellington Hampshire, MD;  Location: [place] [facility];  Service: Cardiovascular;  Laterality: N/A;    Current Medications: Current Meds  Medication Sig   aspirin 81 MG chewable tablet Chew 1 tablet (81 mg total) by mouth daily.   atorvastatin (LIPITOR) 80 MG tablet Take 1 tablet (80 mg total) by mouth daily.   esomeprazole (NEXIUM) 20 MG capsule Take 1 capsule (20 mg total) by mouth daily at 12 noon.   furosemide (LASIX) 20 MG tablet Take 20 mg by mouth daily.   HYDROcodone-acetaminophen (NORCO/VICODIN) 5-325 MG tablet Take 1 tablet by mouth 4 (four) times daily as needed for pain.   hydrOXYzine (ATARAX/VISTARIL) 25 MG tablet Take 25 mg by mouth 4 (four) times daily as needed for anxiety.    nitroGLYCERIN (NITROSTAT) 0.4 MG SL tablet Place 1 tablet (0.4 mg total) under the tongue every 5 (five) minutes as needed for chest pain.   OVER THE COUNTER MEDICATION Take 1 tablet by mouth daily at 12 noon. Calm Aid   potassium chloride (KLOR-CON) 10 MEQ tablet Take 10 mEq by mouth daily.   prasugrel (EFFIENT) 10 MG TABS tablet Take 1 tablet (10 mg total) by mouth daily.   sacubitril-valsartan (ENTRESTO) 24-26 MG Take 1 tablet by mouth 2 (two) times daily.   Current Facility-Administered Medications for the 02/26/21 encounter (Office Visit) with Kathlen Mody, Abyan Cadman H, PA-C  Medication   sodium chloride flush (NS) 0.9 % injection 3 mL   sodium chloride flush (NS) 0.9 % injection 3 mL     Allergies:   Amiodarone   Social History   Socioeconomic History   Marital status: Divorced    Spouse name: Not on file   Number of children: Not on file   Years of education: Not on file   Highest education level: Not on file  Occupational History   Not on file  Tobacco Use   Smoking status: Former  Packs/day: 1.50    Years: 45.00    Pack years: 67.50    Types: Cigarettes    Quit date: 12/20/2020    Years since quitting: 0.1   Smokeless tobacco: Never  Vaping Use   Vaping Use: Never used  Substance and Sexual Activity   Alcohol use: Not Currently   Drug use: Never   Sexual activity: Not on file  Other Topics Concern   Not on file  Social History Narrative   Not on file   Social Determinants of Health   Financial Resource Strain: Not on file  Food Insecurity: Not on file  Transportation Needs: Not on file  Physical Activity: Not on file  Stress: Not on file  Social Connections: Not on file     Family History: The patient's family history is not on file.  ROS:   Please see the history of present illness.     All other systems reviewed and are negative.  EKGs/Labs/Other Studies Reviewed:    The following studies were reviewed today:  Cardiac cath 02/23/2021   1st Mrg  lesion is 50% stenosed.   Dist LAD lesion is 80% stenosed.   LPAV lesion is 50% stenosed.   Ost RCA to Prox RCA lesion is 40% stenosed.   Non-stenotic Prox Cx to Mid Cx lesion was previously treated.   There is moderate left ventricular systolic dysfunction.   LV end diastolic pressure is normal.   The left ventricular ejection fraction is 35-45% by visual estimate.   1.  Widely patent left circumflex stent with no significant restenosis.  The RCA is codominant with high anterior takeoff.  The 70% ostial stenosis that was noted on previous catheterization appears to be better and no more than 40%.  Suspect previous catheter induced spasm.  The proximal portion of the right coronary artery has an ectatic segment causing some sluggish flow in the vessel which was noted on previous angiography as well. 2.  Moderately reduced LV systolic function with an EF of 35 to 40%.  Normal left ventricular end-diastolic pressure at 7 mmHg.   Recommendations: Recommend continued medical therapy for coronary artery disease. The patient was noted to be bradycardic on presentation with heart rate in the low 40s.  Thus, I elected to discontinue metoprolol. The exact etiology for his continued shortness of breath is not entirely clear but there could be a component of physical deconditioning.  In addition, he does have cardiomyopathy and we will have to treat him medically for heart failure.  His LVEDP was normal.  Echo limited 12/22/2020 1. Limited tudy   2. Left ventricular ejection fraction, by estimation, is 45 to 50%. The  left ventricle has mildly decreased function. The left ventricle  demonstrates regional wall motion abnormalities (Hypokinesis of the  inferolateral wall). There is mild left  ventricular hypertrophy.   3. Right ventricular systolic function is normal. The right ventricular  size is normal. Tricuspid regurgitation signal is inadequate for assessing  PA pressure.   Echo 12/21/20  1. Left  ventricular ejection fraction, by estimation, is 50%. The left  ventricle has low normal function. The left ventricle demonstrates  regional wall motion abnormalities (see scoring diagram/findings for  description). There is mild left ventricular  hypertrophy. Left ventricular diastolic parameters are consistent with  Grade II diastolic dysfunction (pseudonormalization). There is moderate  hypokinesis of the left ventricular, entire inferolateral wall.   2. Right ventricular systolic function is normal. The right ventricular  size is normal.  3. The mitral valve is normal in structure. No evidence of mitral valve  regurgitation.   4. The aortic valve is tricuspid. Aortic valve regurgitation is not  visualized.   Cardiac cath 12/20/20 1. Left ventricular ejection fraction, by estimation, is 50%. The left  ventricle has low normal function. The left ventricle demonstrates  regional wall motion abnormalities (see scoring diagram/findings for  description). There is mild left ventricular  hypertrophy. Left ventricular diastolic parameters are consistent with  Grade II diastolic dysfunction (pseudonormalization). There is moderate  hypokinesis of the left ventricular, entire inferolateral wall.   2. Right ventricular systolic function is normal. The right ventricular  size is normal.   3. The mitral valve is normal in structure. No evidence of mitral valve  regurgitation.   4. The aortic valve is tricuspid. Aortic valve regurgitation is not  visualized.    EKG:  EKG is  ordered today.  The ekg ordered today demonstrates NSR, PAC, PVC, old TWI inferolateral leads  Recent Labs: 12/21/2020: TSH 1.189 12/31/2020: Magnesium 2.1 02/04/2021: ALT 25; BNP 76.5; BUN 24; Creatinine, Ser 1.32; Hemoglobin 14.5; Platelets 225; Potassium 4.8; Sodium 142  Recent Lipid Panel    Component Value Date/Time   CHOL 166 02/04/2021 0909   TRIG 148 02/04/2021 0909   HDL 38 (L) 02/04/2021 0909   CHOLHDL 4.4  02/04/2021 0909   CHOLHDL 8.0 12/21/2020 0543   VLDL 54 (H) 12/21/2020 0543   LDLCALC 102 (H) 02/04/2021 0909     Physical Exam:    VS:  BP 130/90 (BP Location: Left Arm, Patient Position: Sitting, Cuff Size: Normal)   Pulse 65   Ht 6\' 1"  (1.854 m)   Wt 207 lb 4 oz (94 kg)   SpO2 96%   BMI 27.34 kg/m     Wt Readings from Last 3 Encounters:  02/26/21 207 lb 4 oz (94 kg)  02/23/21 200 lb (90.7 kg)  02/04/21 205 lb (93 kg)     GEN:  Well nourished, well developed in no acute distress HEENT: Normal NECK: No JVD; No carotid bruits LYMPHATICS: No lymphadenopathy CARDIAC: RRR, no murmurs, rubs, gallops RESPIRATORY:  Clear to auscultation without rales, wheezing or rhonchi  ABDOMEN: Soft, non-tender, non-distended MUSCULOSKELETAL:  No edema; No deformity  SKIN: Warm and dry NEUROLOGIC:  Alert and oriented x 3 PSYCHIATRIC:  Normal affect   ASSESSMENT:    1. Coronary artery disease involving native coronary artery of native heart with angina pectoris ([place])   2. Chronic systolic heart failure ([place])   3. Essential hypertension   4. Hyperlipidemia, mixed   5. Orthostasis   6. History of tobacco use   7. 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 Cath site stable. Unable to afford cardiac rehab. Overall feeling better. Reports improved shortness of breath, still has fatigue. No chest pain.  Continue DAPT with ASA and Effient. Continue statin. BB stopped for bradycardia at time of cath. Increase activity as tolerated. No further work-up at this time.  HFrEF Cath showed LVEF 35-40%. BB stopped for bradycardia at time of cath. Today he reports dizziness with position changes. Orthostatics negative, BP good. He is on Entresto and Lasix. LVEDP normal by cath. Given orthostatic symptoms  will hold on further GDMT. Will reassess symptoms in a month,  if symptoms improve can consider addition of Farxiga/spiro.   HLD LDL 103 last month. Continue statin  HTN with orthostatic symptoms BP good. Continue Entresto, he will monitor symptoms at home.  Prolonged Qtc Improved by EKG today  PAF No known reoccurrence.   Tobacco use Previously quit smoking.    Disposition: Follow up in 1 month(s) with MD/APP    Signed, Tashae Inda Ninfa Meeker, PA-C  02/26/2021 11:47 AM    [facility]

## 2021-02-26 ENCOUNTER — Ambulatory Visit (INDEPENDENT_AMBULATORY_CARE_PROVIDER_SITE_OTHER): Payer: Self-pay | Admitting: Medical

## 2021-02-26 ENCOUNTER — Encounter: Payer: Self-pay | Admitting: Medical

## 2021-02-26 ENCOUNTER — Other Ambulatory Visit: Payer: Self-pay

## 2021-02-26 VITALS — BP 130/90 | HR 65 | Ht 73.0 in | Wt 207.2 lb

## 2021-02-26 DIAGNOSIS — I10 Essential (primary) hypertension: Secondary | ICD-10-CM

## 2021-02-26 DIAGNOSIS — I25119 Atherosclerotic heart disease of native coronary artery with unspecified angina pectoris: Secondary | ICD-10-CM

## 2021-02-26 DIAGNOSIS — E782 Mixed hyperlipidemia: Secondary | ICD-10-CM

## 2021-02-26 DIAGNOSIS — I951 Orthostatic hypotension: Secondary | ICD-10-CM

## 2021-02-26 DIAGNOSIS — Z8679 Personal history of other diseases of the circulatory system: Secondary | ICD-10-CM

## 2021-02-26 DIAGNOSIS — Z87891 Personal history of nicotine dependence: Secondary | ICD-10-CM

## 2021-02-26 DIAGNOSIS — I5022 Chronic systolic (congestive) heart failure: Secondary | ICD-10-CM

## 2021-02-26 NOTE — Patient Instructions (Signed)
Medication Instructions:  No changes at this time.  *If you need a refill on your cardiac medications before your next appointment, please call your pharmacy*   Lab Work: None  If you have labs (blood work) drawn today and your tests are completely normal, you will receive your results only by: [place] (if you have MyChart) OR A paper copy in the mail If you have any lab test that is abnormal or we need to change your treatment, we will call you to review the results.   Testing/Procedures: None   Follow-Up: At [facility], you and your health needs are our priority.  As part of our continuing mission to provide you with exceptional heart care, we have created designated Provider Care Teams.  These Care Teams include your primary Cardiologist (physician) and Advanced Practice Providers (APPs -  Physician Assistants and Nurse Practitioners) who all work together to provide you with the care you need, when you need it.   Your next appointment:   1 month(s)  The format for your next appointment:   In Person  Provider:   [name], MD or Cadence Kathlen Mody, [place]

## 2021-03-16 NOTE — Telephone Encounter (Signed)
Medication Samples have been provided to the patient.  Drug name: Delene Loll        Strength: 24-26 mg         Qty: 1 bottle   LOT: [id number]   Exp.Date: 10/24  Samples placed up front for patient to pick up.

## 2021-03-30 ENCOUNTER — Other Ambulatory Visit: Payer: Self-pay

## 2021-03-30 ENCOUNTER — Ambulatory Visit (INDEPENDENT_AMBULATORY_CARE_PROVIDER_SITE_OTHER): Payer: Self-pay | Admitting: Medical

## 2021-03-30 ENCOUNTER — Encounter: Payer: Self-pay | Admitting: Medical

## 2021-03-30 ENCOUNTER — Ambulatory Visit (INDEPENDENT_AMBULATORY_CARE_PROVIDER_SITE_OTHER): Payer: Self-pay

## 2021-03-30 VITALS — BP 130/80 | HR 74 | Ht 73.0 in | Wt 210.0 lb

## 2021-03-30 DIAGNOSIS — E782 Mixed hyperlipidemia: Secondary | ICD-10-CM

## 2021-03-30 DIAGNOSIS — I480 Paroxysmal atrial fibrillation: Secondary | ICD-10-CM

## 2021-03-30 DIAGNOSIS — R5383 Other fatigue: Secondary | ICD-10-CM

## 2021-03-30 DIAGNOSIS — I25119 Atherosclerotic heart disease of native coronary artery with unspecified angina pectoris: Secondary | ICD-10-CM

## 2021-03-30 DIAGNOSIS — Z87891 Personal history of nicotine dependence: Secondary | ICD-10-CM

## 2021-03-30 DIAGNOSIS — I10 Essential (primary) hypertension: Secondary | ICD-10-CM

## 2021-03-30 DIAGNOSIS — I255 Ischemic cardiomyopathy: Secondary | ICD-10-CM

## 2021-03-30 NOTE — Patient Instructions (Signed)
Medication Instructions:  Your physician recommends that you continue on your current medications as directed. Please refer to the Current Medication list given to you today.  *If you need a refill on your cardiac medications before your next appointment, please call your pharmacy*   Lab Work: Bmp and Cbc today  If you have labs (blood work) drawn today and your tests are completely normal, you will receive your results only by: [place] (if you have MyChart) OR A paper copy in the mail If you have any lab test that is abnormal or we need to change your treatment, we will call you to review the results.   Testing/Procedures: Your physician has recommended that you wear a Zio monitor XT to be worn for 14 days.   The monitor will be mailed to your home. Please follow the application and return instructions that come with the monitor   This monitor is a medical device that records the heart's electrical activity. Doctors most often use these monitors to diagnose arrhythmias. Arrhythmias are problems with the speed or rhythm of the heartbeat. The monitor is a small device applied to your chest. You can wear one while you do your normal daily activities. While wearing this monitor if you have any symptoms to push the button and record what you felt. Once you have worn this monitor for the period of time provider prescribed (Usually 14 days), you will return the monitor device in the postage paid box. Once it is returned they will download the data collected and provide [place] with a report which the provider will then review and we will call you with those results. Important tips:  Avoid showering during the first 24 hours of wearing the monitor. Avoid excessive sweating to help maximize wear time. Do not submerge the device, no hot tubs, and no swimming pools. Keep any lotions or oils away from the patch. After 24 hours you may shower with the patch on. Take brief showers with your back  facing the shower head.  Do not remove patch once it has been placed because that will interrupt data and decrease adhesive wear time. Push the button when you have any symptoms and write down what you were feeling. Once you have completed wearing your monitor, remove and place into box which has postage paid and place in your outgoing mailbox.  If for some reason you have misplaced your box then call our office and we can provide another box and/or mail it off for you.      Follow-Up: At [facility], you and your health needs are our priority.  As part of our continuing mission to provide you with exceptional heart care, we have created designated Provider Care Teams.  These Care Teams include your primary Cardiologist (physician) and Advanced Practice Providers (APPs -  Physician Assistants and Nurse Practitioners) who all work together to provide you with the care you need, when you need it.  We recommend signing up for the patient portal called "MyChart".  Sign up information is provided on this After Visit Summary.  MyChart is used to connect with patients for Virtual Visits (Telemedicine).  Patients are able to view lab/test results, encounter notes, upcoming appointments, etc.  Non-urgent messages can be sent to your provider as well.   To learn more about what you can do with MyChart, go to [email or web address].    Your next appointment:   2 month(s)  The format for your next appointment:   In Person  Provider:   You may see [name], MD or one of the following Advanced Practice Providers on your designated Care Team:   Murray Hodgkins, NP Christell Faith, PA-C Cadence Kathlen Mody, [place]    Other Instructions N/A

## 2021-03-30 NOTE — Progress Notes (Signed)
Cardiology Office Note:    Date:  03/30/2021   ID:  Won, Kreuzer [birth year], MRN [id number]  PCP:  Lynnell Jude, MD  [facility] Cardiologist:  [name], MD  [facility] Electrophysiologist:  Virl Axe, MD   Referring MD: Lynnell Jude, MD   Chief Complaint: 3 week follow-up  History of Present Illness:    Darryl Gilbert is a 65 y.o. male with a hx of CAD in the setting of inferior ST elevation MI on 12/20/2020 status post PCI/DES to the LCx complicated by V. tach/V. fib/torsades to points, HFrEF secondary to post MI ICM, A. fib, HTN, HLD, hyperglycemia, prior tobacco use, and chronic back pain who presents for complex hospital follow-up after recent admission to [facility] on 9/3 through 9/9 as outlined below.   He was recently admitted to [facility] on 12/20/2020 with an inferior STEMI complicated by acute HFrEF secondary to post MI ICM, V. tach/V. fib/torsades to points in the setting of marked QT prolongation, IV amiodarone, and MI, PAF with RVR, and renal dysfunction.  Upon presentation, he underwent emergent LHC which showed codominant coronary arteries with thrombotic occlusion of the proximal LCx which was the culprit for the inferior ST EMI.  There was also 70% stenosis in the ostial RCA which had a high anterior takeoff.  Catheter induced spasm in the ostial right coronary artery was unable to be completely excluded.  He underwent successful PCI/DES to the LCx.  Initial presenting rhythm was A. fib, though he converted to sinus rhythm by the end of his LHC with amiodarone drip.  Echo post PCI on 9/4, demonstrated an EF of 50%, mild LVH, moderate hypokinesis of the entire inferolateral wall, grade 2 diastolic dysfunction, normal RV systolic function and ventricular cavity size, and no significant valvular abnormalities.  Post PCI on 9/5, he began to develop frequent PVCs along with runs of NSVT.  He was asymptomatic with this.  He then went into polymorphic VT that degenerated into  ventricular fibrillation which required multiple shocks.  He was intubated for airway protection.  Twelve-lead EKG showed no significant ST elevation, though QT interval was significantly prolonged at greater than 550 ms.  His presentation was consistent with torsades to points.  Given this, amiodarone was discontinued.  He received 2 g of magnesium.  He then went into SVT converting to sinus rhythm with IV metoprolol.  With discontinuation of amiodarone, QT interval continued to improve throughout his hospital stay and he had no further episodes of significant ventricular arrhythmias.  Throughout his stay he was noted to have frequent PACs and short runs of atrial tachycardia.  After discussion with EP, he was briefly placed on a lidocaine drip.  Interventional cardiology indicated there was no need for relook angiography given his event did not seem to be mediated by stent thrombosis as he had no complaints of chest pain or anginal symptoms and his EKG showed no ST elevation.  QT prolonging medications were recommended to be avoided.  Repeat limited echo following the above on 9/5 demonstrated an EF of 45 to 50%, hypokinesis of the inferolateral wall, mild LVH, normal RV systolic function and ventricular cavity size.   Seen 01/20/2021 and reported increasing shortness of breath for about 1 to 2 weeks and atypical chest pain. Under stress since hospital events. Could not do cardiac rehab due to cost.  Cath images were reviewed and walking program was recommended.   Last seen 02/04/21 and reported chest pain and SOB. He  was set up for repeat LHC. LHC showed patent Lcx stent wit no significant restenosis. Residual disease was noted to be 70% ostial stenosis that improved to 40%, LVEF 35-40%. LVEDP 74mmHg. Recommended continued medical therapy. He was bradycardic on presentation and metoprolol was stopped.   Seen back 02/26/21 and the patient reported improved symptoms since the cardiac cath. Stopping BB improved  energy. Could not afford cardiac rehab. He had orthostatic symptoms and GDMT was held.   Today, the patient reports minimal improvement in energy. He still has a hard time doing activity outside for a long period of time. Plans on gion back to work a couple days during the week. Has some SOB while sitting. Any exertion makes him out of breath. No LLE, pnd. He has chronic orthopnea.He is doing as much as he can, cannot go out for a1/2 mile walk. Has arthritis that limits his function.    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    Hyperlipidemia LDL goal <70    Hypertension    Ischemic cardiomyopathy     Past Surgical History:  Procedure Laterality Date   CORONARY/GRAFT ACUTE MI REVASCULARIZATION N/A 12/20/2020   Procedure: Coronary/Graft Acute MI Revascularization;  Surgeon: Wellington Hampshire, MD;  Location: [place] [facility];  Service: Cardiovascular;  Laterality: N/A;   LEFT HEART CATH AND CORONARY ANGIOGRAPHY N/A 12/20/2020   Procedure: LEFT HEART CATH AND CORONARY ANGIOGRAPHY;  Surgeon: Wellington Hampshire, MD;  Location: [place] [facility];  Service: Cardiovascular;  Laterality: N/A;   LEFT HEART CATH AND CORONARY ANGIOGRAPHY N/A 02/23/2021   Procedure: LEFT HEART CATH AND CORONARY ANGIOGRAPHY;  Surgeon: Wellington Hampshire, MD;  Location: [place] [facility];  Service: Cardiovascular;  Laterality: N/A;    Current Medications: Current Meds  Medication Sig   aspirin 81 MG chewable tablet Chew 1 tablet (81 mg total) by mouth daily.   atorvastatin (LIPITOR) 80 MG tablet Take 1 tablet (80 mg total) by mouth daily.   esomeprazole (NEXIUM) 20 MG capsule Take 1 capsule (20 mg total) by mouth daily at 12 noon.   furosemide (LASIX) 20 MG tablet Take 20 mg by mouth daily.   HYDROcodone-acetaminophen (NORCO/VICODIN) 5-325 MG tablet Take 1  tablet by mouth 4 (four) times daily as needed for pain.   hydrOXYzine (ATARAX/VISTARIL) 25 MG tablet Take 25 mg by mouth 4 (four) times daily as needed for anxiety.   nitroGLYCERIN (NITROSTAT) 0.4 MG SL tablet Place 1 tablet (0.4 mg total) under the tongue every 5 (five) minutes as needed for chest pain.   OVER THE COUNTER MEDICATION Take 1 tablet by mouth daily at 12 noon. Calm Aid   potassium chloride (KLOR-CON) 10 MEQ tablet Take 10 mEq by mouth daily.   prasugrel (EFFIENT) 10 MG TABS tablet Take 1 tablet (10 mg total) by mouth daily.   sacubitril-valsartan (ENTRESTO) 24-26 MG Take 1 tablet by mouth 2 (two) times daily.   Current Facility-Administered Medications for the 03/30/21 encounter (Office Visit) with Kathlen Mody, Kristina Bertone H, PA-C  Medication   sodium chloride flush (NS) 0.9 % injection 3 mL   sodium chloride flush (NS) 0.9 % injection 3 mL     Allergies:   Amiodarone   Social History   Socioeconomic History   Marital status: Divorced    Spouse name: Not on file  Number of children: Not on file   Years of education: Not on file   Highest education level: Not on file  Occupational History   Not on file  Tobacco Use   Smoking status: Former    Packs/day: 1.50    Years: 45.00    Pack years: 67.50    Types: Cigarettes    Quit date: 12/20/2020    Years since quitting: 0.2   Smokeless tobacco: Never  Vaping Use   Vaping Use: Never used  Substance and Sexual Activity   Alcohol use: Not Currently   Drug use: Never   Sexual activity: Not on file  Other Topics Concern   Not on file  Social History Narrative   Not on file   Social Determinants of Health   Financial Resource Strain: Not on file  Food Insecurity: Not on file  Transportation Needs: Not on file  Physical Activity: Not on file  Stress: Not on file  Social Connections: Not on file     Family History: The patient's family history is not on file.  ROS:   Please see the history of present illness.     All  other systems reviewed and are negative.  EKGs/Labs/Other Studies Reviewed:    The following studies were reviewed today:  Cardiac cath 02/23/2021   1st Mrg lesion is 50% stenosed.   Dist LAD lesion is 80% stenosed.   LPAV lesion is 50% stenosed.   Ost RCA to Prox RCA lesion is 40% stenosed.   Non-stenotic Prox Cx to Mid Cx lesion was previously treated.   There is moderate left ventricular systolic dysfunction.   LV end diastolic pressure is normal.   The left ventricular ejection fraction is 35-45% by visual estimate.   1.  Widely patent left circumflex stent with no significant restenosis.  The RCA is codominant with high anterior takeoff.  The 70% ostial stenosis that was noted on previous catheterization appears to be better and no more than 40%.  Suspect previous catheter induced spasm.  The proximal portion of the right coronary artery has an ectatic segment causing some sluggish flow in the vessel which was noted on previous angiography as well. 2.  Moderately reduced LV systolic function with an EF of 35 to 40%.  Normal left ventricular end-diastolic pressure at 7 mmHg.   Recommendations: Recommend continued medical therapy for coronary artery disease. The patient was noted to be bradycardic on presentation with heart rate in the low 40s.  Thus, I elected to discontinue metoprolol. The exact etiology for his continued shortness of breath is not entirely clear but there could be a component of physical deconditioning.  In addition, he does have cardiomyopathy and we will have to treat him medically for heart failure.  His LVEDP was normal.   Echo limited 12/22/2020 1. Limited tudy   2. Left ventricular ejection fraction, by estimation, is 45 to 50%. The  left ventricle has mildly decreased function. The left ventricle  demonstrates regional wall motion abnormalities (Hypokinesis of the  inferolateral wall). There is mild left  ventricular hypertrophy.   3. Right ventricular  systolic function is normal. The right ventricular  size is normal. Tricuspid regurgitation signal is inadequate for assessing  PA pressure.    Echo 12/21/20  1. Left ventricular ejection fraction, by estimation, is 50%. The left  ventricle has low normal function. The left ventricle demonstrates  regional wall motion abnormalities (see scoring diagram/findings for  description). There is mild left ventricular  hypertrophy.  Left ventricular diastolic parameters are consistent with  Grade II diastolic dysfunction (pseudonormalization). There is moderate  hypokinesis of the left ventricular, entire inferolateral wall.   2. Right ventricular systolic function is normal. The right ventricular  size is normal.   3. The mitral valve is normal in structure. No evidence of mitral valve  regurgitation.   4. The aortic valve is tricuspid. Aortic valve regurgitation is not  visualized.    Cardiac cath 12/20/20 1. Left ventricular ejection fraction, by estimation, is 50%. The left  ventricle has low normal function. The left ventricle demonstrates  regional wall motion abnormalities (see scoring diagram/findings for  description). There is mild left ventricular  hypertrophy. Left ventricular diastolic parameters are consistent with  Grade II diastolic dysfunction (pseudonormalization). There is moderate  hypokinesis of the left ventricular, entire inferolateral wall.   2. Right ventricular systolic function is normal. The right ventricular  size is normal.   3. The mitral valve is normal in structure. No evidence of mitral valve  regurgitation.   4. The aortic valve is tricuspid. Aortic valve regurgitation is not  visualized.   EKG:  EKG is not ordered today.   Recent Labs: 12/21/2020: TSH 1.189 12/31/2020: Magnesium 2.1 02/04/2021: ALT 25; BNP 76.5; BUN 24; Creatinine, Ser 1.32; Hemoglobin 14.5; Platelets 225; Potassium 4.8; Sodium 142  Recent Lipid Panel    Component Value Date/Time   CHOL  166 02/04/2021 0909   TRIG 148 02/04/2021 0909   HDL 38 (L) 02/04/2021 0909   CHOLHDL 4.4 02/04/2021 0909   CHOLHDL 8.0 12/21/2020 0543   VLDL 54 (H) 12/21/2020 0543   LDLCALC 102 (H) 02/04/2021 0909     Physical Exam:    VS:  BP 130/80 (BP Location: Left Arm, Patient Position: Sitting, Cuff Size: Normal)   Pulse 74   Ht 6\' 1"  (1.854 m)   Wt 210 lb (95.3 kg)   SpO2 98%   BMI 27.71 kg/m     Wt Readings from Last 3 Encounters:  03/30/21 210 lb (95.3 kg)  02/26/21 207 lb 4 oz (94 kg)  02/23/21 200 lb (90.7 kg)     GEN:  Well nourished, well developed in no acute distress HEENT: Normal NECK: No JVD; No carotid bruits LYMPHATICS: No lymphadenopathy CARDIAC: RRR, no murmurs, rubs, gallops RESPIRATORY:  Clear to auscultation without rales, wheezing or rhonchi  ABDOMEN: Soft, non-tender, non-distended MUSCULOSKELETAL:  No edema; No deformity  SKIN: Warm and dry NEUROLOGIC:  Alert and oriented x 3 PSYCHIATRIC:  Normal affect   ASSESSMENT:    1. Paroxysmal atrial fibrillation (HCC)   2. Coronary artery disease involving native coronary artery of native heart with angina pectoris ([place])   3. History of tobacco use   4. Essential hypertension   5. Ischemic cardiomyopathy   6. Hyperlipidemia, mixed   7. Other fatigue    PLAN:    In order of problems listed above:  Fatigue CAD s/p PCI to Lcx Prior repeat cath showed patent stent and improved ostial stenosis lesion 40%, LVEDP normal, and LVEF 35-40%.He reports persistent fatigue since the MI. Feels SOB on exertion. He is unable to walk 1/2 mile. He was unable to afford cardiac rehab.Stopping BB mildly improved energy. He is on DAPT with ASA and Effient. I will check a CBC. Will also get a 2 week heart monitor to check for recurrent Afib.   HFrEF/ICM Cath showed LVEF 35-40%. BB stopped for bradycardia at time of cath. He has DOE as above. He takes  lasix 20mg  daily. He is euvolemic on exam. I will check a BNP. Continue  Entresto. I will hold on GDMT until after heart monitor.   HLD LDL 103. Continue statin.  HTN with orthostatic symptoms No further orthostatic symptoms. BP today good. Continue Entresto  Prolonged Qtc No repeat EKG today  PAF In the setting of MI. No known reoccurrence. Repeat 2 week heart monitor for possible recurrent Afib contributing to fatigue. In SR on exam today.   Tobacco use Quit smoking 12/2020.  Disposition: Follow up in 2 month(s) with MD/APP    Signed, Sophira Rumler Ninfa Meeker, PA-C  03/30/2021 1:37 PM    [facility]

## 2021-03-31 ENCOUNTER — Telehealth: Payer: Self-pay | Admitting: Medical

## 2021-03-31 DIAGNOSIS — I480 Paroxysmal atrial fibrillation: Secondary | ICD-10-CM

## 2021-03-31 LAB — CBC WITH DIFFERENTIAL/PLATELET
Basophils Absolute: 0.1 10*3/uL (ref 0.0–0.2)
Basos: 2 %
EOS (ABSOLUTE): 0.5 10*3/uL — ABNORMAL HIGH (ref 0.0–0.4)
Eos: 7 %
Hematocrit: 41.6 % (ref 37.5–51.0)
Hemoglobin: 14.6 g/dL (ref 13.0–17.7)
Immature Grans (Abs): 0 10*3/uL (ref 0.0–0.1)
Immature Granulocytes: 0 %
Lymphocytes Absolute: 2.1 10*3/uL (ref 0.7–3.1)
Lymphs: 29 %
MCH: 31.1 pg (ref 26.6–33.0)
MCHC: 35.1 g/dL (ref 31.5–35.7)
MCV: 89 fL (ref 79–97)
Monocytes Absolute: 0.4 10*3/uL (ref 0.1–0.9)
Monocytes: 6 %
Neutrophils Absolute: 4 10*3/uL (ref 1.4–7.0)
Neutrophils: 56 %
Platelets: 229 10*3/uL (ref 150–450)
RBC: 4.7 x10E6/uL (ref 4.14–5.80)
RDW: 12 % (ref 11.6–15.4)
WBC: 7.2 10*3/uL (ref 3.4–10.8)

## 2021-03-31 LAB — BASIC METABOLIC PANEL
BUN/Creatinine Ratio: 13 (ref 10–24)
BUN: 18 mg/dL (ref 8–27)
CO2: 22 mmol/L (ref 20–29)
Calcium: 9.8 mg/dL (ref 8.6–10.2)
Chloride: 100 mmol/L (ref 96–106)
Creatinine, Ser: 1.34 mg/dL — ABNORMAL HIGH (ref 0.76–1.27)
Glucose: 103 mg/dL — ABNORMAL HIGH (ref 70–99)
Potassium: 4.4 mmol/L (ref 3.5–5.2)
Sodium: 138 mmol/L (ref 134–144)
eGFR: 59 mL/min/{1.73_m2} — ABNORMAL LOW (ref >59)

## 2021-03-31 NOTE — Telephone Encounter (Signed)
Patient made aware of lab results with verbalized understanding.. Patient is agreeable with coming to the office for his BNP to be drawn. Lab appt scheduled on 04/01/21 @ 9am. Patient voiced appreciation for the call.

## 2021-03-31 NOTE — Telephone Encounter (Signed)
Furth, Cadence H, PA-C  P Cv Div [place] Triage Labs look good. I think we were supposed to get a BNP, if patient is OK can he go back for more blood work. Thanks

## 2021-04-01 ENCOUNTER — Other Ambulatory Visit (INDEPENDENT_AMBULATORY_CARE_PROVIDER_SITE_OTHER): Payer: Self-pay

## 2021-04-01 ENCOUNTER — Other Ambulatory Visit: Payer: Self-pay

## 2021-04-01 DIAGNOSIS — I480 Paroxysmal atrial fibrillation: Secondary | ICD-10-CM

## 2021-04-02 LAB — BRAIN NATRIURETIC PEPTIDE: BNP: 42.2 pg/mL (ref 0.0–100.0)

## 2021-04-03 DIAGNOSIS — I480 Paroxysmal atrial fibrillation: Secondary | ICD-10-CM

## 2021-04-06 ENCOUNTER — Telehealth: Payer: Self-pay | Admitting: Emergency Medicine

## 2021-04-06 ENCOUNTER — Encounter: Payer: Self-pay | Admitting: *Deleted

## 2021-04-06 NOTE — Telephone Encounter (Signed)
Called patient and left message with results

## 2021-04-06 NOTE — Telephone Encounter (Signed)
-----   Message from [place], PA-C sent at 04/06/2021 10:22 AM EST ----- BNP normal, low suspicion for volume overload. Continue current lasix dose.

## 2021-04-21 ENCOUNTER — Ambulatory Visit: Payer: Self-pay | Admitting: Physician Assistant

## 2021-04-28 DIAGNOSIS — I471 Supraventricular tachycardia, unspecified: Secondary | ICD-10-CM

## 2021-04-28 HISTORY — DX: Supraventricular tachycardia, unspecified: I47.10

## 2021-05-04 ENCOUNTER — Telehealth: Payer: Self-pay | Admitting: Medical

## 2021-05-04 DIAGNOSIS — I471 Supraventricular tachycardia: Secondary | ICD-10-CM

## 2021-05-04 DIAGNOSIS — I491 Atrial premature depolarization: Secondary | ICD-10-CM

## 2021-05-04 NOTE — Telephone Encounter (Signed)
1) I called and spoke with the patient regarding his monitor results and Cadence Kathlen Mody, PA's recommendations to see one of our EP physicians. The patient voices understanding and is agreeable.  - He is aware I will place a referral to EP and one of our schedulers will reach out to him to set this up. - He is aware this is a consultation in the clinic in [place].    2) The patient also wanted me to speak with Cadence about his work. - He is currently working from home ~ 6-8 hours a day. - His boss is wanting him to start coming in the office 4 days a week, working ~ 8-10 hours a day. 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 - The patient wanted to confirm with Cadence if she thought he would be ok from a cardiac perspective to work in the office the full 4 days a week (8-10 hours per shift).  The patient is aware I will forward this message to Cadence to review and we will call back with further recommendations once received.   The patient voices understanding of all of the above and is agreeable.   To note- He does not have insurance.

## 2021-05-04 NOTE — Telephone Encounter (Signed)
Darryl Ninfa Meeker, PA-C  05/04/2021  1:31 PM EST     Heart monitor showed 119 runs of SVT longest 7 beats, 5 runs of NSVT, 15% PAC burden BB stopped previously for bradycardia Needs to see E, please refer

## 2021-05-05 NOTE — Telephone Encounter (Signed)
Patient wanting status of advice below .

## 2021-05-05 NOTE — Telephone Encounter (Signed)
Antony Madura, PA-C  Sent: Tue May 05, 2021  1:00 PM  To: Emily Filbert, RN          Message  Regarding returning back to work, a lot is dependent on what the patient can tolerate. 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

## 2021-05-05 NOTE — Telephone Encounter (Signed)
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   Patient also stated that he was looking at [place] at appointments. Pt is currently scheduled for Dr. Quentin Ore on 2/15 and to see Cadence on 2/17. I explained that Dr. Quentin Ore is one of our EP doctor's that Cadence had referred him to, and that we could likely cancel his appointment with Cadence on the 17th. Pt stated that he wouldn't want to do that as she will likely be consulting with Dr. Quentin Ore. I told him that I would check with Cadence about cancelling the appointment with her on 2/17, and call him back.   Patient verbalized understanding. Patient voiced appreciation for the call and understands that they can reach out to our office with any questions or concerns.

## 2021-05-06 NOTE — Telephone Encounter (Signed)
Called patient back. Spoke with wife Baker Janus (OK per DPR).   Explained that per Cadence, pt could keep his appt on 2/17 or move it out a week. Baker Janus said that he would rather move it out a week.   Appt moved to 2/24 at 8:50.

## 2021-05-08 DIAGNOSIS — I4729 Other ventricular tachycardia: Secondary | ICD-10-CM

## 2021-05-08 HISTORY — DX: Other ventricular tachycardia: I47.29

## 2021-06-01 ENCOUNTER — Emergency Department (EMERGENCY_DEPARTMENT_HOSPITAL): Admit: 2021-06-01 | Discharge: 2021-06-01 | Disposition: A | Discharge status: Home / Self Care | Payer: Self-pay

## 2021-06-01 ENCOUNTER — Emergency Department
Admission: EM | Admit: 2021-06-01 | Discharge: 2021-06-01 | Disposition: A | Discharge status: Home / Self Care | Payer: Self-pay | Attending: Emergency Medicine | Admitting: Emergency Medicine

## 2021-06-01 ENCOUNTER — Emergency Department: Payer: Self-pay

## 2021-06-01 DIAGNOSIS — R0602 Shortness of breath: Secondary | ICD-10-CM | POA: Insufficient documentation

## 2021-06-01 DIAGNOSIS — Z20822 Contact with and (suspected) exposure to covid-19: Secondary | ICD-10-CM | POA: Insufficient documentation

## 2021-06-01 DIAGNOSIS — R55 Syncope and collapse: Secondary | ICD-10-CM

## 2021-06-01 DIAGNOSIS — R6883 Chills (without fever): Secondary | ICD-10-CM | POA: Insufficient documentation

## 2021-06-01 DIAGNOSIS — R0789 Other chest pain: Secondary | ICD-10-CM

## 2021-06-01 DIAGNOSIS — I2510 Atherosclerotic heart disease of native coronary artery without angina pectoris: Secondary | ICD-10-CM | POA: Insufficient documentation

## 2021-06-01 LAB — CBC WITH DIFFERENTIAL/PLATELET
Abs Immature Granulocytes: 0.03 10*3/uL (ref 0.00–0.07)
Basophils Absolute: 0.1 10*3/uL (ref 0.0–0.1)
Basophils Relative: 1 %
Eosinophils Absolute: 0.4 10*3/uL (ref 0.0–0.5)
Eosinophils Relative: 6 %
HCT: 44.9 % (ref 39.0–52.0)
Hemoglobin: 14.8 g/dL (ref 13.0–17.0)
Immature Granulocytes: 0 %
Lymphocytes Relative: 31 %
Lymphs Abs: 2.1 10*3/uL (ref 0.7–4.0)
MCH: 30.6 pg (ref 26.0–34.0)
MCHC: 33 g/dL (ref 30.0–36.0)
MCV: 93 fL (ref 80.0–100.0)
Monocytes Absolute: 0.3 10*3/uL (ref 0.1–1.0)
Monocytes Relative: 5 %
Neutro Abs: 3.8 10*3/uL (ref 1.7–7.7)
Neutrophils Relative %: 57 %
Platelets: 205 10*3/uL (ref 150–400)
RBC: 4.83 MIL/uL (ref 4.22–5.81)
RDW: 12.2 % (ref 11.5–15.5)
WBC: 6.8 10*3/uL (ref 4.0–10.5)
nRBC: 0 % (ref 0.0–0.2)

## 2021-06-01 LAB — COMPREHENSIVE METABOLIC PANEL
ALT: 25 U/L (ref 0–44)
AST: 25 U/L (ref 15–41)
Albumin: 3.8 g/dL (ref 3.5–5.0)
Alkaline Phosphatase: 59 U/L (ref 38–126)
Anion gap: 9 (ref 5–15)
BUN: 19 mg/dL (ref 8–23)
CO2: 22 mmol/L (ref 22–32)
Calcium: 8.8 mg/dL — ABNORMAL LOW (ref 8.9–10.3)
Chloride: 102 mmol/L (ref 98–111)
Creatinine, Ser: 1.5 mg/dL — ABNORMAL HIGH (ref 0.61–1.24)
GFR, Estimated: 52 mL/min — ABNORMAL LOW (ref >60)
Glucose, Bld: 145 mg/dL — ABNORMAL HIGH (ref 70–99)
Potassium: 4.2 mmol/L (ref 3.5–5.1)
Sodium: 133 mmol/L — ABNORMAL LOW (ref 135–145)
Total Bilirubin: 0.9 mg/dL (ref 0.3–1.2)
Total Protein: 7.4 g/dL (ref 6.5–8.1)

## 2021-06-01 LAB — CBG MONITORING, ED: Glucose-Capillary: 132 mg/dL — ABNORMAL HIGH (ref 70–99)

## 2021-06-01 LAB — BRAIN NATRIURETIC PEPTIDE: B Natriuretic Peptide: 41.3 pg/mL (ref 0.0–100.0)

## 2021-06-01 LAB — RESP PANEL BY RT-PCR (FLU A&B, COVID) ARPGX2
Influenza A by PCR: NEGATIVE
Influenza B by PCR: NEGATIVE
SARS Coronavirus 2 by RT PCR: NEGATIVE

## 2021-06-01 LAB — TROPONIN I (HIGH SENSITIVITY)
Troponin I (High Sensitivity): 11 ng/L (ref <18)
Troponin I (High Sensitivity): 12 ng/L (ref <18)

## 2021-06-01 MED ORDER — NITROGLYCERIN 0.4 MG SL SUBL: 0.4000 mg | SUBLINGUAL_TABLET | SUBLINGUAL | Status: DC | PRN

## 2021-06-01 NOTE — ED Notes (Signed)
ED Provider at bedside. 

## 2021-06-01 NOTE — ED Triage Notes (Signed)
Pt brought in per EMS for c/o CP. History of MI w/ stents placed 37months ago. Aspirin324mg  taken PTA, 1 nitro SL taken PTA. Pt presents to ED AAOx4, respi even-unlabored

## 2021-06-01 NOTE — ED Notes (Signed)
Per nurse from cardiology, monitor is on pt now.

## 2021-06-01 NOTE — ED Provider Notes (Signed)
[facility] Provider Note    Event Date/Time   First MD Initiated Contact with Patient 06/01/21 [id number]     (approximate)  History   Chest Pain  HPI Darryl Gilbert is a 66 y.o. male with a stated past medical history of CAD with stent placement approximately 5 months ago after an inferior STEMI who presents today for chest tightness has been present for the last 2 days, intermittent since onset, and associated with shortness of breath.  Patient states that he has had exertional shortness of breath since his MI but that it has worsened today.  Patient also endorses shaking chills.  Patient received 324 of aspirin and 1 sublingual nitroglycerin with minimal relief in his pain prior to arrival.     Physical Exam   Triage Vital Signs: ED Triage Vitals  Enc Vitals Group     BP 06/01/21 0658 123/71     Pulse Rate 06/01/21 0658 67     Resp 06/01/21 0658 18     Temp 06/01/21 0658 97.9 F (36.6 C)     Temp Source 06/01/21 0658 Oral     SpO2 06/01/21 0658 100 %     Weight 06/01/21 0700 209 lb 7 oz (95 kg)     Height 06/01/21 0700 6\' 1"  (1.854 m)     Head Circumference --      Peak Flow --      Pain Score 06/01/21 0654 4     Pain Loc --      Pain Edu? --      Excl. in [place]? --     Most recent vital signs: Vitals:   06/01/21 1230 06/01/21 1300  BP: 109/79 136/69  Pulse: (!) 54 (!) 49  Resp: 16 13  Temp:    SpO2: 96% 96%    General: Awake, oriented x4. CV:  Good peripheral perfusion.  Resp:  Normal effort.  Abd:  No distention.  Other:  Elderly Caucasian male laying in bed in no distress wrapped in blankets due to cold intolerance   ED Results / Procedures / Treatments   Labs (all labs ordered are listed, but only abnormal results are displayed) Labs Reviewed  COMPREHENSIVE METABOLIC PANEL - Abnormal; Notable for the following components:      Result Value   Sodium 133 (*)    Glucose, Bld 145 (*)    Creatinine, Ser 1.50 (*)    Calcium 8.8 (*)     GFR, Estimated 52 (*)    All other components within normal limits  CBG MONITORING, ED - Abnormal; Notable for the following components:   Glucose-Capillary 132 (*)    All other components within normal limits  RESP PANEL BY RT-PCR (FLU A&B, COVID) ARPGX2  BRAIN NATRIURETIC PEPTIDE  CBC WITH DIFFERENTIAL/PLATELET  TROPONIN I (HIGH SENSITIVITY)  TROPONIN I (HIGH SENSITIVITY)     EKG ED ECG REPORT I, Naaman Plummer, the attending physician, personally viewed and interpreted this ECG.  Date: 06/01/2021 EKG Time: 0659 Rate: 54 Rhythm: normal sinus rhythm QRS Axis: normal Intervals: normal ST/T Wave abnormalities: normal Narrative Interpretation: Q waves in inferior leads present.  Otherwise normal sinus rhythm with no evidence of acute ischemia   RADIOLOGY ED MD interpretation: One-view portable chest x-ray shows no evidence of acute abnormalities including no pneumonia, pneumothorax, or widened mediastinum  Official radiology report(s): DG Chest Port 1 View  Result Date: 06/01/2021 CLINICAL DATA:  Chest pain EXAM: PORTABLE CHEST 1 VIEW COMPARISON:  12/23/2020 chest  radiograph. FINDINGS: Stable cardiomediastinal silhouette with normal heart size. No pneumothorax. No pleural effusion. Lungs appear clear, with no acute consolidative airspace disease and no pulmonary edema. IMPRESSION: No active disease. Electronically Signed   By: Ilona Sorrel M.D.   On: 06/01/2021 07:37      PROCEDURES:  Critical Care performed: No  .1-3 Lead EKG Interpretation Performed by: Naaman Plummer, MD Authorized by: Naaman Plummer, MD     Interpretation: abnormal     ECG rate:  51   ECG rate assessment: bradycardic     Rhythm: sinus bradycardia     Ectopy: none     Conduction: normal     MEDICATIONS ORDERED IN ED: Medications - No data to display   IMPRESSION / MDM / [place] / ED COURSE  I reviewed the triage vital signs and the nursing notes.                              The patient is on the cardiac monitor to evaluate for evidence of arrhythmia and/or significant heart rate changes. Patient presents with complaints of syncope/presyncope ED Workup:  CBC, BMP, Troponin, BNP, ECG, CXR Differential diagnosis includes HF, ICH, seizure, stroke, HOCM, ACS, aortic dissection, malignant arrhythmia, or GI bleed. Findings: No evidence of acute laboratory abnormalities.  Troponin negative x1 EKG: No e/o STEMI. No evidence of Brugadas sign, delta wave, epsilon wave, significantly prolonged QTc, or malignant arrhythmia. Consult: I spoke to Dr. Sophronia Simas in cardiology who is this patient's primary cardiologist and recommends a cardiac monitor placed in the emergency department and follow-up with their team within this next week. Patient agrees with plan Disposition: Discharge. Patient is at baseline at this time. Return precautions expressed and understood in person. Advised follow up with primary care provider or clinic physician in next 24 hours.      FINAL CLINICAL IMPRESSION(S) / ED DIAGNOSES   Final diagnoses:  Syncope, unspecified syncope type  Chest pressure     Rx / DC Orders   ED Discharge Orders          Ordered    LONG TERM MONITOR (3-14 DAYS)        06/01/21 1116    LONG TERM MONITOR-LIVE TELEMETRY (3-14 DAYS)        06/01/21 0954             Note:  This document was prepared using Dragon voice recognition software and may include unintentional dictation errors.   Naaman Plummer, MD 06/01/21 [phone]

## 2021-06-03 ENCOUNTER — Encounter: Payer: Self-pay | Admitting: Cardiology

## 2021-06-03 ENCOUNTER — Encounter: Payer: Self-pay | Admitting: *Deleted

## 2021-06-03 ENCOUNTER — Other Ambulatory Visit: Payer: Self-pay

## 2021-06-03 ENCOUNTER — Ambulatory Visit (INDEPENDENT_AMBULATORY_CARE_PROVIDER_SITE_OTHER): Payer: Self-pay | Admitting: Cardiology

## 2021-06-03 VITALS — BP 128/80 | HR 60 | Ht 73.0 in | Wt 211.0 lb

## 2021-06-03 DIAGNOSIS — I25119 Atherosclerotic heart disease of native coronary artery with unspecified angina pectoris: Secondary | ICD-10-CM

## 2021-06-03 DIAGNOSIS — I5022 Chronic systolic (congestive) heart failure: Secondary | ICD-10-CM

## 2021-06-03 DIAGNOSIS — I4721 Torsades de pointes: Secondary | ICD-10-CM

## 2021-06-03 DIAGNOSIS — R55 Syncope and collapse: Secondary | ICD-10-CM

## 2021-06-03 DIAGNOSIS — I480 Paroxysmal atrial fibrillation: Secondary | ICD-10-CM

## 2021-06-03 NOTE — Progress Notes (Signed)
Electrophysiology Office Follow up Visit Note:    Date:  06/03/2021   ID:  KRAVEN CALK, DOB [birth year], MRN [id number]  PCP:  Lynnell Jude, MD  [facility] Cardiologist:  [name], MD  [facility] Electrophysiologist:  Vickie Epley, MD    Interval History:    Darryl Gilbert is a 66 y.o. male who presents for a follow up visit.  There was seen on December 24, 2020 by Dr. Caryl Comes in the hospital.  He was admitted originally September 3 with an acute inferior myocardial infarction.  During the hospitalization was found to be in atrial fibrillation with a rapid ventricular rate.  He underwent an emergent heart cath which showed severe disease of his proximal circumflex, proximal RCA disease and a 60% OM1 lesion.  His EF was 35 to 40%.  Amiodarone was started for his atrial fibrillation.  On hospital day 2 he had a polymorphic VT arrest that degenerated into ventricular fibrillation requiring multiple shocks.  Dr. Caryl Comes felt like his torsades was most likely related to drug-induced QT prolongation from his amiodarone.  Beta-blockers were recommended for his atrial fibrillation given he is not a candidate for class Ic's or class III's.  His ejection fraction on a limited repeat echo had improved to 45 to 50% during the hospitalization.  He had a follow-up heart catheterization in October which showed a patent circumflex stent.  He saw Cadence on March 30, 2021.  At that appointment he reported a significant reduction in his energy level.    On June 01, 2021 he presented to the emergency department with exertional chest pain and syncope.  He was brought into the hospital by EMS.  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  He started to feel poorly with chest pain, shortness of breath and nausea and eventually lost consciousness altogether.  Coworkers called EMS.  His work-up in the hospital was negative and he was discharged on a ZIO live monitor.  He is still wearing this  today.  He has early fatigue with minimal exertion.  He is taking his medications which include Entresto, prasugrel, Lasix, Lipitor, aspirin.  He is in clinic today with family.       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    Hyperlipidemia LDL goal <70    Hypertension    Ischemic cardiomyopathy     Past Surgical History:  Procedure Laterality Date   CORONARY/GRAFT ACUTE MI REVASCULARIZATION N/A 12/20/2020   Procedure: Coronary/Graft Acute MI Revascularization;  Surgeon: Wellington Hampshire, MD;  Location: [place] [facility];  Service: Cardiovascular;  Laterality: N/A;   LEFT HEART CATH AND CORONARY ANGIOGRAPHY N/A 12/20/2020   Procedure: LEFT HEART CATH AND CORONARY ANGIOGRAPHY;  Surgeon: Wellington Hampshire, MD;  Location: [place] [facility];  Service: Cardiovascular;  Laterality: N/A;   LEFT HEART CATH AND CORONARY ANGIOGRAPHY N/A 02/23/2021   Procedure: LEFT HEART CATH AND CORONARY ANGIOGRAPHY;  Surgeon: Wellington Hampshire, MD;  Location: [place] [facility];  Service: Cardiovascular;  Laterality: N/A;    Current Medications: Current Meds  Medication Sig   aspirin 81 MG chewable tablet Chew 1 tablet (81 mg total) by mouth daily.   atorvastatin (LIPITOR) 80 MG tablet Take 1 tablet (80 mg total) by mouth daily.   esomeprazole (  NEXIUM) 20 MG capsule Take 1 capsule (20 mg total) by mouth daily at 12 noon.   furosemide (LASIX) 20 MG tablet Take 20 mg by mouth daily.   HYDROcodone-acetaminophen (NORCO/VICODIN) 5-325 MG tablet Take 1 tablet by mouth 4 (four) times daily as needed for pain.   hydrOXYzine (ATARAX/VISTARIL) 25 MG tablet Take 25 mg by mouth 4 (four) times daily as needed for anxiety.   nitroGLYCERIN (NITROSTAT) 0.4 MG SL tablet Place 1 tablet (0.4 mg total) under the tongue every 5 (five) minutes as needed for chest  pain.   OVER THE COUNTER MEDICATION Take 1 tablet by mouth daily at 12 noon. Calm Aid   potassium chloride (KLOR-CON) 10 MEQ tablet Take 10 mEq by mouth daily.   prasugrel (EFFIENT) 10 MG TABS tablet Take 1 tablet (10 mg total) by mouth daily.   sacubitril-valsartan (ENTRESTO) 24-26 MG Take 1 tablet by mouth 2 (two) times daily.   Current Facility-Administered Medications for the 06/03/21 encounter (Office Visit) with Vickie Epley, MD  Medication   sodium chloride flush (NS) 0.9 % injection 3 mL   sodium chloride flush (NS) 0.9 % injection 3 mL     Allergies:   Amiodarone   Social History   Socioeconomic History   Marital status: Divorced    Spouse name: Not on file   Number of children: Not on file   Years of education: Not on file   Highest education level: Not on file  Occupational History   Not on file  Tobacco Use   Smoking status: Former    Packs/day: 1.50    Years: 45.00    Pack years: 67.50    Types: Cigarettes    Quit date: 12/20/2020    Years since quitting: 0.4   Smokeless tobacco: Never  Vaping Use   Vaping Use: Never used  Substance and Sexual Activity   Alcohol use: Not Currently   Drug use: Never   Sexual activity: Not on file  Other Topics Concern   Not on file  Social History Narrative   Not on file   Social Determinants of Health   Financial Resource Strain: Not on file  Food Insecurity: Not on file  Transportation Needs: Not on file  Physical Activity: Not on file  Stress: Not on file  Social Connections: Not on file     Family History: The patient's family history is not on file.  ROS:   Please see the history of present illness.    All other systems reviewed and are negative.  EKGs/Labs/Other Studies Reviewed:    The following studies were reviewed today:    EKG:  The ekg ordered today demonstrates sinus rhythm with PAC  Recent Labs: 12/21/2020: TSH 1.189 12/31/2020: Magnesium 2.1 06/01/2021: ALT 25; B Natriuretic Peptide  41.3; BUN 19; Creatinine, Ser 1.50; Hemoglobin 14.8; Platelets 205; Potassium 4.2; Sodium 133  Recent Lipid Panel    Component Value Date/Time   CHOL 166 02/04/2021 0909   TRIG 148 02/04/2021 0909   HDL 38 (L) 02/04/2021 0909   CHOLHDL 4.4 02/04/2021 0909   CHOLHDL 8.0 12/21/2020 0543   VLDL 54 (H) 12/21/2020 0543   LDLCALC 102 (H) 02/04/2021 0909    Physical Exam:    VS:  BP 128/80 (BP Location: Left Arm, Patient Position: Sitting, Cuff Size: Normal)    Pulse 60    Ht 6\' 1"  (1.854 m)    Wt 211 lb (95.7 kg)    SpO2 98%    BMI 27.84  kg/m     Wt Readings from Last 3 Encounters:  06/03/21 211 lb (95.7 kg)  06/01/21 209 lb 7 oz (95 kg)  03/30/21 210 lb (95.3 kg)     GEN:  Well nourished, well developed in no acute distress HEENT: Normal NECK: No JVD; No carotid bruits LYMPHATICS: No lymphadenopathy CARDIAC: RRR, no murmurs, rubs, gallops RESPIRATORY:  Clear to auscultation without rales, wheezing or rhonchi  ABDOMEN: Soft, non-tender, non-distended MUSCULOSKELETAL:  No edema; No deformity  SKIN: Warm and dry NEUROLOGIC:  Alert and oriented x 3 PSYCHIATRIC:  Normal affect        ASSESSMENT:    1. Syncope, unspecified syncope type   2. Paroxysmal atrial fibrillation (HCC)   3. Coronary artery disease involving native coronary artery of native heart with angina pectoris ([place])   4. Torsades de pointes   5. Chronic systolic heart failure (HCC)    PLAN:    In order of problems listed above:  #Syncope #Paroxysmal atrial fibrillation #Coronary artery disease Patient with recent ER visit for presyncope and chest pain.  He is wearing a ZIO live monitor.  We will need to follow-up the results of that at his next appointment.  He is not a candidate for class Ic or class III antiarrhythmic drugs for his paroxysmal atrial fibrillation because of his coronary artery disease and history of torsades secondary to antiarrhythmic drug.  If paroxysmal atrial fibrillation is felt to be a  significant contributor to his symptoms, would need to consider catheter ablation.  This may be difficult with his insurance status.  I think right now it is important for [place] to follow-up the results of the ZIO live monitor to see if he is having any sort of arrhythmias that could be the cause of his symptoms and fatigue.  I would also like to repeat a limited echo to make sure that his fatigue is not secondary to her reduced left ventricular function.  I will plan to follow-up with him after his limited echo and ZIO monitor have returned.  #Drug-induced torsades Avoid all QT prolonging medications.  Given his recent syncopal episode, discussed driving restrictions with the patient and his wife during today's visit.  I have advised him that until his work-up is completed he should avoid operating heavy machinery and driving.  Follow-up with me in about 4 to 6 weeks.  Total time spent with patient today 60 minutes. This includes reviewing records, evaluating the patient and coordinating care.   Medication Adjustments/Labs and Tests Ordered: Current medicines are reviewed at length with the patient today.  Concerns regarding medicines are outlined above.  Orders Placed This Encounter  Procedures   EKG 12-Lead   ECHOCARDIOGRAM LIMITED   No orders of the defined types were placed in this encounter.    Signed, Lars Mage, MD, [facility], [facility] 06/03/2021 8:51 PM    Electrophysiology [facility]

## 2021-06-03 NOTE — Patient Instructions (Addendum)
Medications: Your physician recommends that you continue on your current medications as directed. Please refer to the Current Medication list given to you today. *If you need a refill on your cardiac medications before your next appointment, please call your pharmacy*  Lab Work: None. If you have labs (blood work) drawn today and your tests are completely normal, you will receive your results only by: [place] (if you have MyChart) OR A paper copy in the mail If you have any lab test that is abnormal or we need to change your treatment, we will call you to review the results.  Testing/Procedures: Your physician has requested that you have an echocardiogram. Echocardiography is a painless test that uses sound waves to create images of your heart. It provides your doctor with information about the size and shape of your heart and how well your hearts chambers and valves are working. This procedure takes approximately one hour. There are no restrictions for this procedure.   Follow-Up: At [facility], you and your health needs are our priority.  As part of our continuing mission to provide you with exceptional heart care, we have created designated Provider Care Teams.  These Care Teams include your primary Cardiologist (physician) and Advanced Practice Providers (APPs -  Physician Assistants and Nurse Practitioners) who all work together to provide you with the care you need, when you need it.  Your physician wants you to follow-up in: 4-6 weeks with Lars Mage   We recommend signing up for the patient portal called "MyChart".  Sign up information is provided on this After Visit Summary.  MyChart is used to connect with patients for Virtual Visits (Telemedicine).  Patients are able to view lab/test results, encounter notes, upcoming appointments, etc.  Non-urgent messages can be sent to your provider as well.   To learn more about what you can do with MyChart, go to  [email or web address].    Any Other Special Instructions Will Be Listed Below (If Applicable).

## 2021-06-05 ENCOUNTER — Ambulatory Visit: Payer: Self-pay | Admitting: Medical

## 2021-06-12 ENCOUNTER — Ambulatory Visit: Payer: Self-pay | Admitting: Medical

## 2021-06-23 NOTE — Addendum Note (Signed)
Encounter addended by: Jeannette How on: 06/23/2021 10:42 AM  Actions taken: Imaging Exam ended

## 2021-06-25 ENCOUNTER — Ambulatory Visit (INDEPENDENT_AMBULATORY_CARE_PROVIDER_SITE_OTHER): Payer: Self-pay

## 2021-06-25 ENCOUNTER — Other Ambulatory Visit: Payer: Self-pay

## 2021-06-25 DIAGNOSIS — R55 Syncope and collapse: Secondary | ICD-10-CM

## 2021-06-25 LAB — ECHOCARDIOGRAM LIMITED
Area-P 1/2: 7.29 cm2
S' Lateral: 4.2 cm
Single Plane A4C EF: 44.8 %

## 2021-06-25 MED ORDER — PERFLUTREN LIPID MICROSPHERE
1.0000 mL | INTRAVENOUS | Status: AC | PRN
  Administered 2021-06-25: 2 mL via INTRAVENOUS

## 2021-07-08 ENCOUNTER — Ambulatory Visit (INDEPENDENT_AMBULATORY_CARE_PROVIDER_SITE_OTHER): Payer: Self-pay | Admitting: Cardiology

## 2021-07-08 ENCOUNTER — Encounter: Payer: Self-pay | Admitting: Cardiology

## 2021-07-08 ENCOUNTER — Other Ambulatory Visit: Payer: Self-pay

## 2021-07-08 VITALS — BP 120/70 | HR 58 | Ht 72.0 in | Wt 207.0 lb

## 2021-07-08 DIAGNOSIS — I4721 Torsades de pointes: Secondary | ICD-10-CM

## 2021-07-08 DIAGNOSIS — I2121 ST elevation (STEMI) myocardial infarction involving left circumflex coronary artery: Secondary | ICD-10-CM

## 2021-07-08 DIAGNOSIS — I5022 Chronic systolic (congestive) heart failure: Secondary | ICD-10-CM

## 2021-07-08 DIAGNOSIS — F419 Anxiety disorder, unspecified: Secondary | ICD-10-CM

## 2021-07-08 NOTE — Progress Notes (Signed)
?Electrophysiology Office Follow up Visit Note:   ? ?Date:  07/08/2021  ? ?ID:  Darryl Gilbert, DOB 09/26/1955, MRN [id number] ? ?PCP:  Bliss, Laura K, MD  ?[facility] Cardiologist:  Muhammad Arida, MD  ?[facility] Electrophysiologist:  CAMERON T LAMBERT, MD  ? ? ?Interval History:   ? ?Darryl Gilbert is a 65 y.o. male who presents for a follow up visit. They were last seen in clinic June 03, 2021. ?He was seen in September by Dr. Klein in the hospital.  He was admitted with an inferior myocardial infarction and is also noted to have atrial fibrillation with rapid ventricular rates.  He received PCI.  His initial EF was down at 35 to 40%.  Amiodarone was started for his atrial fibrillation.  He had a polymorphic VT arrest in the setting of amiodarone use.  Beta-blockers were then used for his atrial fibrillation given the amiodarone was stopped.  His ejection fraction ultimately improved to 45 to 50%. ? ?He is with his wife today in clinic who I previously met. ? ?  ? ?Past Medical History:  ?Diagnosis Date  ? Acute ST elevation myocardial infarction (STEMI) of inferior wall (HCC)   ? Back pain   ? 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 Hyperlipidemia LDL goal <70   ? Hypertension   ? Ischemic cardiomyopathy   ? ? ?Past Surgical History:  ?Procedure Laterality Date  ? CORONARY/GRAFT ACUTE MI REVASCULARIZATION N/A 12/20/2020  ? Procedure: Coronary/Graft Acute MI Revascularization;  Surgeon: Arida, Muhammad A, MD;  Location: [facility] INVAS[facility];  Service: Cardiovascular;  Laterality: N/A;  ? LEFT HEART CATH AND CORONARY ANGIOGRAPHY N/A 12/20/2020  ? Procedure: LEFT HEART CATH AND CORONARY ANGIOGRAPHY;  Surgeon: Arida, Muhammad A, MD;  Location: [facility] INV[facility];  Service: Cardiovascular;  Laterality: N/A;  ? LEFT HEART CATH AND CORONARY ANGIOGRAPHY N/A 02/23/2021  ? Procedure: LEFT HEART CATH AND CORONARY ANGIOGRAPHY;  Surgeon: Arida, Muhammad A, MD;  Location: [facility] INVASIVE CV  LAB;  Service: Cardiovascular;  Laterality: N/A;  ? ? ?Current Medications: ?Current Meds  ?Medication Sig  ? aspirin 81 MG chewable tablet Chew 1 tablet (81 mg total) by mouth daily.  ? atorvastatin (LIPITOR) 80 MG tablet Take 1 tablet (80 mg total) by mouth daily.  ? esomeprazole (NEXIUM) 20 MG capsule Take 1 capsule (20 mg total) by mouth daily at 12 noon.  ? furosemide (LASIX) 20 MG tablet Take 20 mg by mouth daily.  ? HYDROcodone-acetaminophen (NORCO/VICODIN) 5-325 MG tablet Take 1 tablet by mouth 4 (four) times daily as needed for pain.  ? hydrOXYzine (ATARAX/VISTARIL) 25 MG tablet Take 25 mg by mouth 4 (four) times daily as needed for anxiety.  ? nitroGLYCERIN (NITROSTAT) 0.4 MG SL tablet Place 1 tablet (0.4 mg total) under the tongue every 5 (five) minutes as needed for chest pain.  ? OVER THE COUNTER MEDICATION Take 1 tablet by mouth daily at 12 noon. Calm Aid  ? potassium chloride (KLOR-CON) 10 MEQ tablet Take 10 mEq by mouth daily.  ? prasugrel (EFFIENT) 10 MG TABS tablet Take 1 tablet (10 mg total) by mouth daily.  ? sacubitril-valsartan (ENTRESTO) 24-26 MG Take 1 tablet by mouth 2 (two) times daily.  ? ?Current Facility-Administered Medications for the 07/08/21 encounter (Office Visit) with Lambert, Cameron T, MD  ?Medication  ? sodium chloride flush (NS) 0.9 % injection 3 mL  ? sodium chloride flush (NS) 0.9 % injection 3   mL  ?  ? ?Allergies:   Amiodarone  ? ?Social History  ? ?Socioeconomic History  ? Marital status: Divorced  ?  Spouse name: Not on file  ? Number of children: Not on file  ? Years of education: Not on file  ? Highest education level: Not on file  ?Occupational History  ? Not on file  ?Tobacco Use  ? Smoking status: Former  ?  Packs/day: 1.50  ?  Years: 45.00  ?  Pack years: 67.50  ?  Types: Cigarettes  ?  Quit date: 12/20/2020  ?  Years since quitting: 0.5  ? Smokeless tobacco: Never  ?Vaping Use  ? Vaping Use: Never used  ?Substance and Sexual Activity  ? Alcohol use: Not Currently  ?  Drug use: Never  ? Sexual activity: Not on file  ?Other Topics Concern  ? Not on file  ?Social History Narrative  ? Not on file  ? ?Social Determinants of Health  ? ?Financial Resource Strain: Not on file  ?Food Insecurity: Not on file  ?Transportation Needs: Not on file  ?Physical Activity: Not on file  ?Stress: Not on file  ?Social Connections: Not on file  ?  ? ?Family History: ?The patient's family history is not on file. ? ?ROS:   ?Please see the history of present illness.    ?All other systems reviewed and are negative. ? ?EKGs/Labs/Other Studies Reviewed:   ? ?The following studies were reviewed today: ? ?June 25, 2021 echo limited ?Left ventricular function mildly reduced, 45% ?Right ventricular function normal ?No significant valvular abnormalities ? ?June 24 2021 ZIO ?HR 40 - 218 bpm, average 59 bpm. ?5 NSVT, longest lasting 10 beats at an average rate of 103 bpm. ?109 SVT episodes, longest lasting 29.4 seconds with an average rate of 100 bpm. ?Frequent supraventricualr ectopy, 11.6%. ?Occasional ventricular ectopy, 2.9%. ? ? ? ? ?Recent Labs: ?12/21/2020: TSH 1.189 ?12/31/2020: Magnesium 2.1 ?06/01/2021: ALT 25; B Natriuretic Peptide 41.3; BUN 19; Creatinine, Ser 1.50; Hemoglobin 14.8; Platelets 205; Potassium 4.2; Sodium 133  ?Recent Lipid Panel ?   ?Component Value Date/Time  ? CHOL 166 02/04/2021 0909  ? TRIG 148 02/04/2021 0909  ? HDL 38 (L) 02/04/2021 0909  ? CHOLHDL 4.4 02/04/2021 0909  ? CHOLHDL 8.0 12/21/2020 0543  ? VLDL 54 (H) 12/21/2020 0543  ? [place] 102 (H) 02/04/2021 2633  ? ? ?Physical Exam:   ? ?VS:  BP 120/70 (BP Location: Left Arm, Patient Position: Sitting, Cuff Size: Normal)   Pulse (!) 58   Ht 6' (1.829 m)   Wt 207 lb (93.9 kg)   SpO2 98%   BMI 28.07 kg/m?    ? ?Wt Readings from Last 3 Encounters:  ?07/08/21 207 lb (93.9 kg)  ?06/03/21 211 lb (95.7 kg)  ?06/01/21 209 lb 7 oz (95 kg)  ?  ? ?GEN:  Well nourished, well developed in no acute distress ?HEENT: Normal ?NECK: No JVD; No  carotid bruits ?LYMPHATICS: No lymphadenopathy ?CARDIAC: RRR, no murmurs, rubs, gallops ?RESPIRATORY:  Clear to auscultation without rales, wheezing or rhonchi  ?ABDOMEN: Soft, non-tender, non-distended ?MUSCULOSKELETAL:  No edema; No deformity  ?SKIN: Warm and dry ?NEUROLOGIC:  Alert and oriented x 3 ?PSYCHIATRIC: Anxious ? ? ? ?  ? ?ASSESSMENT:   ? ?1. Chronic systolic heart failure ([place])   ?2. Torsades de pointes   ?3. ST elevation myocardial infarction involving left circumflex coronary artery ([place])   ?4. Anxiety   ? ?PLAN:   ? ?In order  of problems listed above: ? ? ?#Chronic systolic heart failure ?NYHA class II.  Warm and dry on exam.  Continue Entresto, Lasix. ?Most recent echo with an EF of 45%.  Not currently an ICD candidate. ? ?#Drug-induced torsades ?Avoid QT prolonging medications ? ?#Anxiety ?I suspect his anxiety is contributing to a lot of his symptoms.  We had a long discussion in clinic today about this.  He agrees.  I will put a referral in for Elias Else, PsyD. ? ?Follow-up 6 months with an APP. ? ? ?Total time spent with patient today 40 minutes. This includes reviewing records, evaluating the patient and coordinating care.  ? ?Medication Adjustments/Labs and Tests Ordered: ?Current medicines are reviewed at length with the patient today.  Concerns regarding medicines are outlined above.  ?Orders Placed This Encounter  ?Procedures  ? Ambulatory referral to Psychology  ? ?No orders of the defined types were placed in this encounter. ? ? ? ?Signed, ?Lars Mage, MD, [facility], [facility] ?07/08/2021 8:37 PM    ?Electrophysiology ?[place] ?

## 2021-07-08 NOTE — Patient Instructions (Signed)
Medications: ?Your physician recommends that you continue on your current medications as directed. Please refer to the Current Medication list given to you today. ?*If you need a refill on your cardiac medications before your next appointment, please call your pharmacy* ? ?Lab Work: ?None. ?If you have labs (blood work) drawn today and your tests are completely normal, you will receive your results only by: ?MyChart Message (if you have MyChart) OR ?A paper copy in the mail ?If you have any lab test that is abnormal or we need to change your treatment, we will call you to review the results. ? ?Testing/Procedures: ?None. ? ?Follow-Up: ?At [facility], you and your health needs are our priority.  As part of our continuing mission to provide you with exceptional heart care, we have created designated Provider Care Teams.  These Care Teams include your primary Cardiologist (physician) and Advanced Practice Providers (APPs -  Physician Assistants and Nurse Practitioners) who all work together to provide you with the care you need, when you need it. ? ?Your physician wants you to follow-up in: 6 months with one of the following Advanced Practice Providers on your designated Care Team:   ? ?Ignacia Bayley, NP ?Christell Faith PA ?Cadence Kathlen Mody PA ? ?  You will receive a reminder letter in the mail two months in advance. If you don't receive a letter, please call our office to schedule the follow-up appointment. ? ?We recommend signing up for the patient portal called "MyChart".  Sign up information is provided on this After Visit Summary.  MyChart is used to connect with patients for Virtual Visits (Telemedicine).  Patients are able to view lab/test results, encounter notes, upcoming appointments, etc.  Non-urgent messages can be sent to your provider as well.   ?To learn more about what you can do with MyChart, go to [email or web address].   ? ?Any Other Special Instructions Will Be Listed Below (If Applicable).  ?

## 2021-07-10 ENCOUNTER — Ambulatory Visit: Payer: Self-pay | Admitting: Medical

## 2021-07-17 ENCOUNTER — Other Ambulatory Visit: Payer: Self-pay | Admitting: Cardiovascular Disease

## 2021-07-17 NOTE — Telephone Encounter (Signed)
Pt taking Potassium 10 meq qd. ?Please advise if ok to refill Potassium 10 meq qd.  ?Historical medication. ?

## 2021-07-23 MED ORDER — ATORVASTATIN CALCIUM 80 MG PO TABS: 80.0000 mg | ORAL_TABLET | Freq: Every day | ORAL | 1 refills | Status: DC

## 2021-08-17 MED ORDER — PRASUGREL HCL 10 MG PO TABS: 10.0000 mg | ORAL_TABLET | Freq: Every day | ORAL | 2 refills | Status: DC

## 2021-12-18 ENCOUNTER — Other Ambulatory Visit: Payer: Self-pay

## 2021-12-18 ENCOUNTER — Other Ambulatory Visit: Payer: Self-pay | Admitting: Cardiovascular Disease

## 2021-12-18 MED ORDER — PRASUGREL HCL 10 MG PO TABS: 10.0000 mg | ORAL_TABLET | Freq: Every day | ORAL | 2 refills | Status: DC

## 2022-01-04 NOTE — Telephone Encounter (Signed)
Patient dropped off [employer] forms to be completed Placed in nurse box 

## 2022-01-05 ENCOUNTER — Other Ambulatory Visit: Payer: Self-pay

## 2022-01-05 IMAGING — DX DG CHEST 1V
2 series · 2 of 2 positions shown · non-contrast
Comparison: Earlier today

CLINICAL DATA: Encounter for central line placement

EXAM:
CHEST  1 VIEW

[chest ap (1 of 2)]
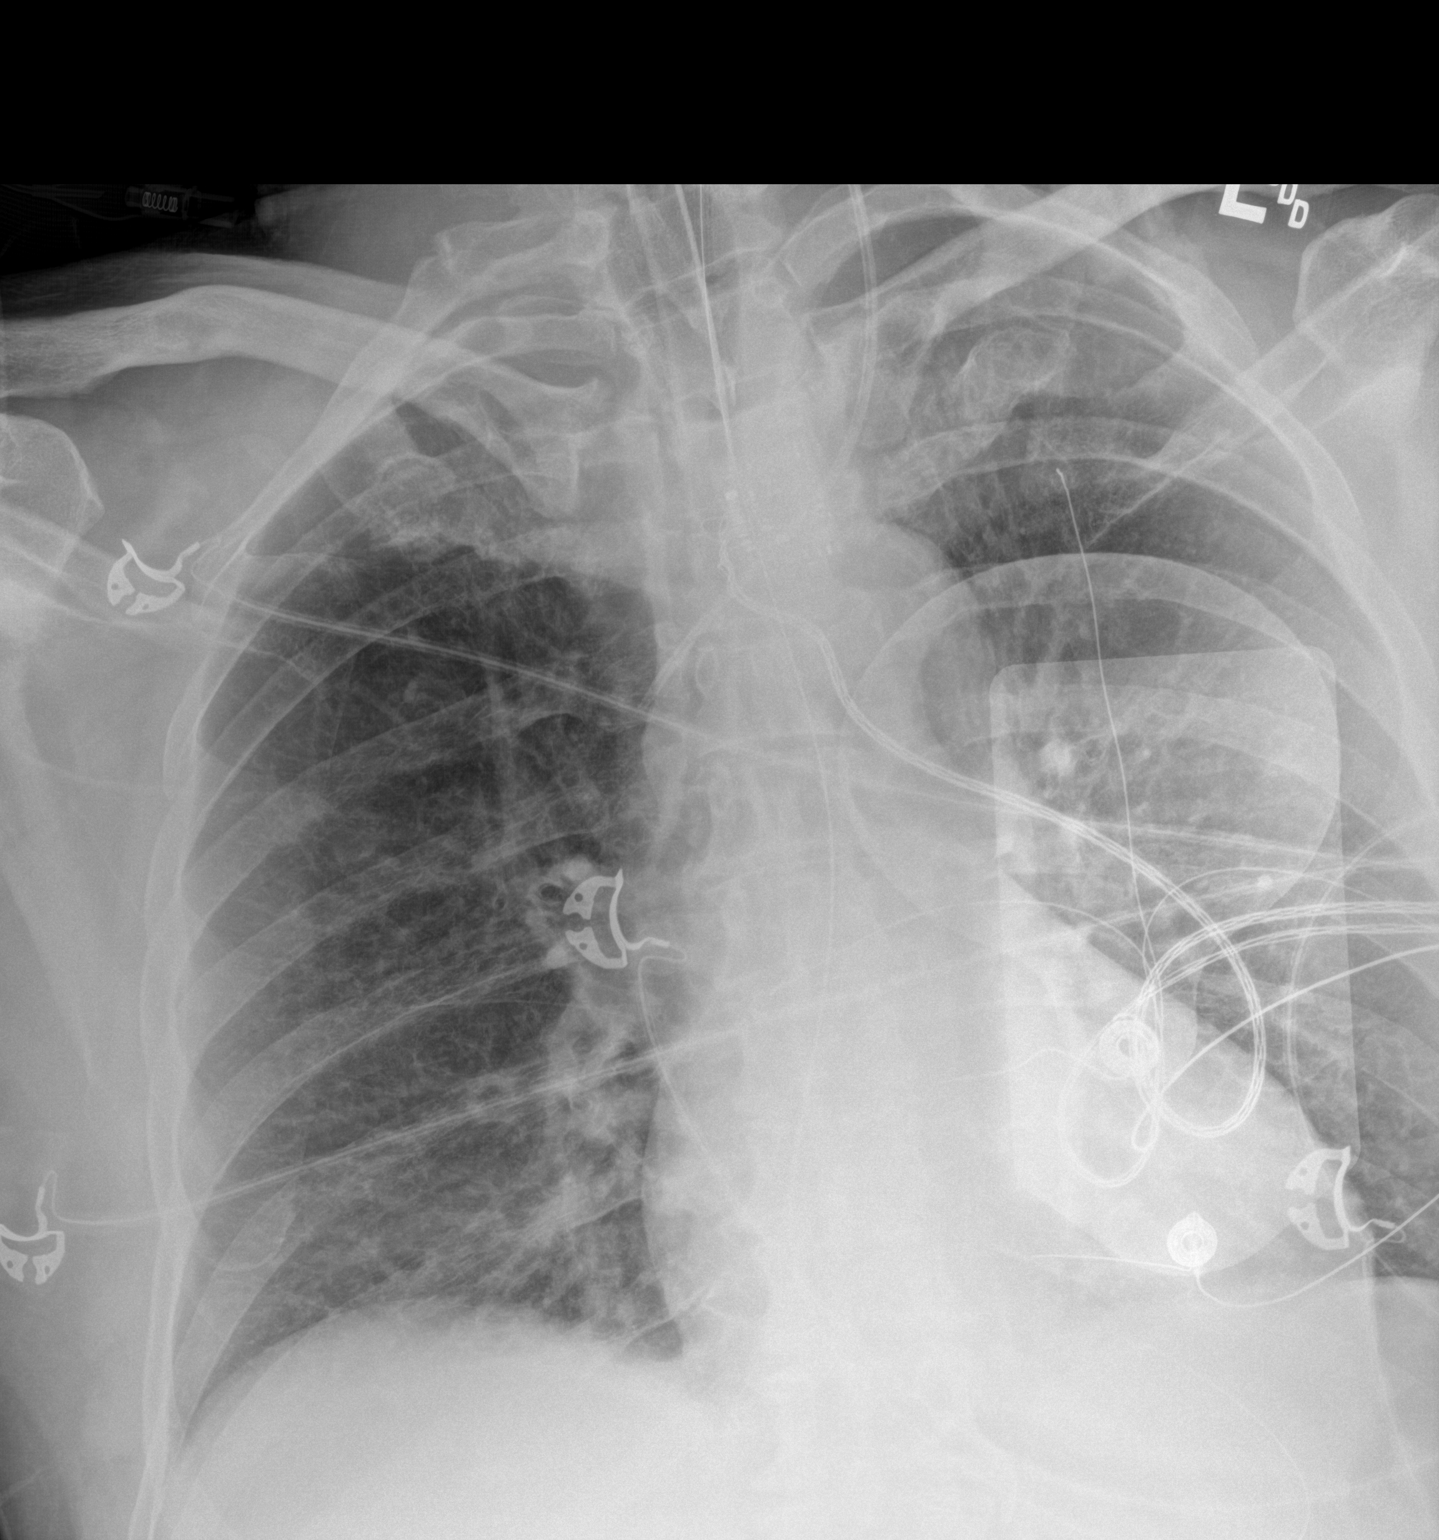

[chest ap (2 of 2)]
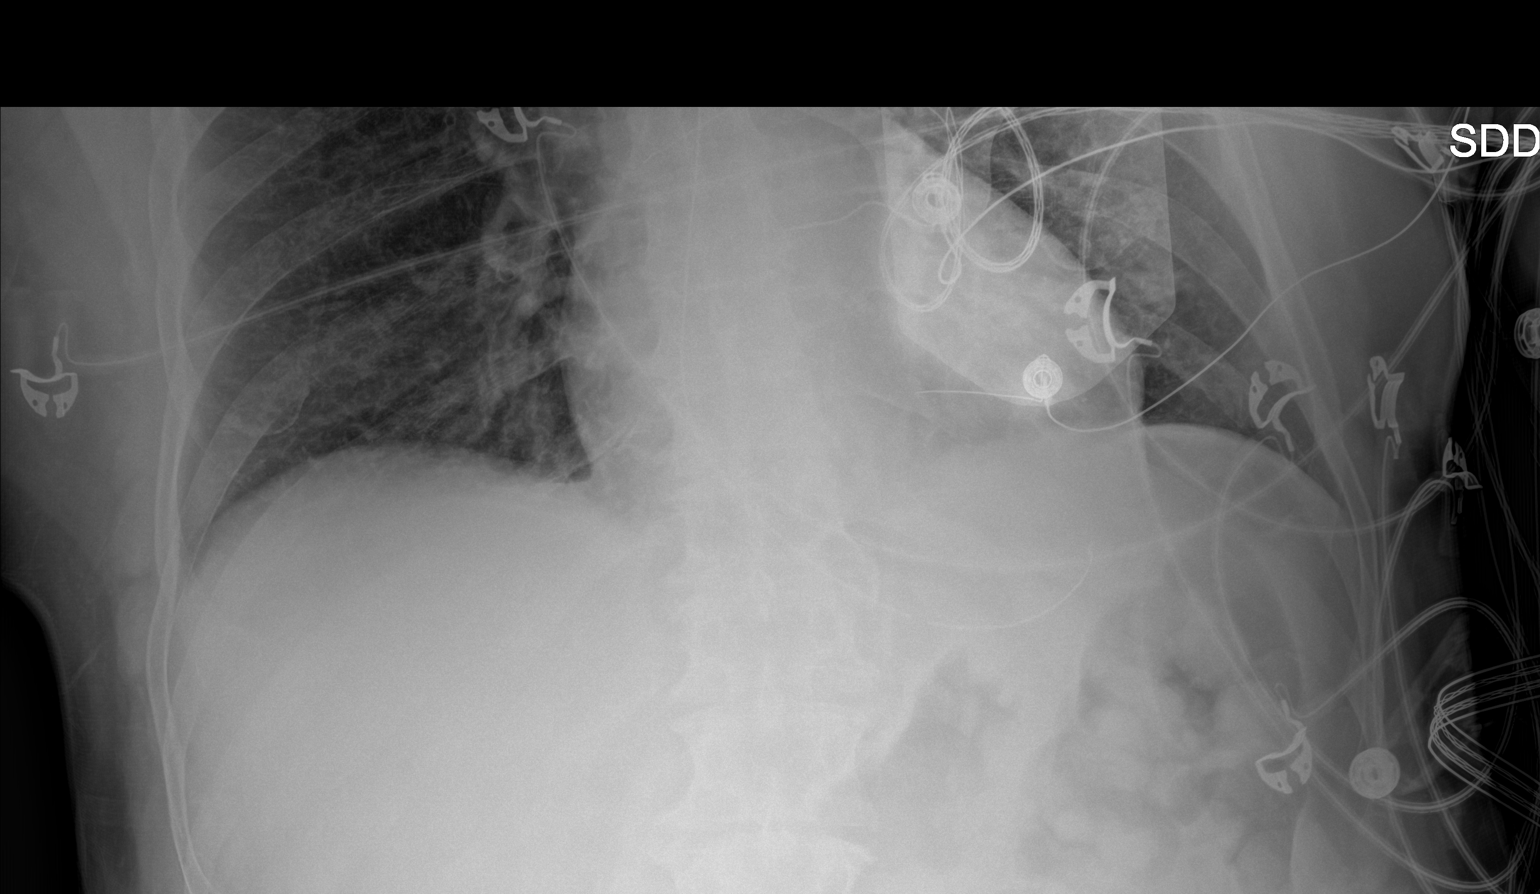

[2 of 2 positions shown; findings below may reference images not displayed]

FINDINGS: New left IJ line with tip near the SVC origin. No pneumothorax or
new mediastinal widening. Enteric and endotracheal tubes in good
position. Stable pulmonary inflation and interstitial coarsening.
Normal heart size.
IMPRESSION: New central line and enteric tube without complicating feature.
Stable aeration.

## 2022-01-05 MED ORDER — ENTRESTO 24-26 MG PO TABS: 1.0000 | ORAL_TABLET | Freq: Two times a day (BID) | ORAL | 3 refills | Status: DC

## 2022-01-05 NOTE — Telephone Encounter (Addendum)
PAF placed on Darryl Gilbert's desk to be signed.

## 2022-01-05 NOTE — Telephone Encounter (Signed)
PAF for [employer] faxed to [employer]. Fax confirmation received. App placed med room file cabinet.

## 2022-01-06 ENCOUNTER — Ambulatory Visit: Payer: Self-pay | Admitting: Cardiology

## 2022-01-07 NOTE — Telephone Encounter (Addendum)
Medication Samples have been provided to the patient.  Drug name: Delene Loll        Strength: 24/26         Qty: 1 bottle  LOT: [id number]   Exp.Date: 10/24  Dosing instructions: 1 tablet twice a day

## 2022-01-14 NOTE — Telephone Encounter (Signed)
Letter received from [employer]. Patient has been approved to receive Entresto at no cost through 04/18/22.

## 2022-01-16 ENCOUNTER — Other Ambulatory Visit: Payer: Self-pay | Admitting: Cardiovascular Disease

## 2022-01-18 NOTE — Telephone Encounter (Signed)
Please contact pt for future appointment. Pt overdue for f/u with Dr. [name].

## 2022-01-20 ENCOUNTER — Ambulatory Visit: Payer: Medicare HMO | Attending: Cardiology | Admitting: Cardiology

## 2022-01-20 ENCOUNTER — Encounter: Payer: Self-pay | Admitting: Cardiology

## 2022-01-20 VITALS — BP 142/68 | HR 77 | Ht 72.0 in | Wt 206.0 lb

## 2022-01-20 DIAGNOSIS — I4721 Torsades de pointes: Secondary | ICD-10-CM | POA: Diagnosis not present

## 2022-01-20 DIAGNOSIS — F419 Anxiety disorder, unspecified: Secondary | ICD-10-CM

## 2022-01-20 DIAGNOSIS — I5022 Chronic systolic (congestive) heart failure: Secondary | ICD-10-CM | POA: Diagnosis not present

## 2022-01-20 MED ORDER — ATORVASTATIN CALCIUM 80 MG PO TABS: 80.0000 mg | ORAL_TABLET | Freq: Every day | ORAL | 3 refills | Status: DC

## 2022-01-20 NOTE — Progress Notes (Signed)
Electrophysiology Office Follow up Visit Note:    Date:  01/20/2022   ID:  STEEN BISIG, DOB 09-05-1955, MRN [id number]  PCP:  Lynnell Jude, MD  [facility] Cardiologist:  [name], MD  [facility] Electrophysiologist:  Vickie Epley, MD    Interval History:    Darryl Gilbert is a 66 y.o. male who presents for a follow up visit. They were last seen in clinic July 08, 2021 for chronic systolic heart failure.  From my last office visit note: "He was admitted with an inferior myocardial infarction and is also noted to have atrial fibrillation with rapid ventricular rates.  He received PCI.  His initial EF was down at 35 to 40%.  Amiodarone was started for his atrial fibrillation.  He had a polymorphic VT arrest in the setting of amiodarone use.  Beta-blockers were then used for his atrial fibrillation given the amiodarone was stopped.  His ejection fraction ultimately improved to 45 to 50%."  Today he tells me he continues to slowly improve.  He is still easily fatigued at work.      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    Hyperlipidemia LDL goal <70    Hypertension    Ischemic cardiomyopathy     Past Surgical History:  Procedure Laterality Date   CORONARY/GRAFT ACUTE MI REVASCULARIZATION N/A 12/20/2020   Procedure: Coronary/Graft Acute MI Revascularization;  Surgeon: Wellington Hampshire, MD;  Location: [place] [facility];  Service: Cardiovascular;  Laterality: N/A;   LEFT HEART CATH AND CORONARY ANGIOGRAPHY N/A 12/20/2020   Procedure: LEFT HEART CATH AND CORONARY ANGIOGRAPHY;  Surgeon: Wellington Hampshire, MD;  Location: [place] [facility];  Service: Cardiovascular;  Laterality: N/A;   LEFT HEART CATH AND CORONARY ANGIOGRAPHY N/A 02/23/2021   Procedure: LEFT HEART CATH AND CORONARY ANGIOGRAPHY;  Surgeon: Wellington Hampshire, MD;  Location: [place] [facility];  Service: Cardiovascular;  Laterality: N/A;    Current Medications: Current Meds  Medication Sig   aspirin 81 MG chewable tablet Chew 1 tablet (81 mg total) by mouth daily.   buPROPion (WELLBUTRIN XL) 300 MG 24 hr tablet Take 300 mg by mouth daily.   esomeprazole (NEXIUM) 20 MG capsule Take 1 capsule (20 mg total) by mouth daily at 12 noon.   furosemide (LASIX) 20 MG tablet TAKE ONE TABLET BY MOUTH ONE TIME DAILY AS NEEDED FOR SHORTNESS OF BREATH AND SWELLING   HYDROcodone-acetaminophen (NORCO) 10-325 MG tablet Take 1 tablet by mouth 3 (three) times daily as needed.   nitroGLYCERIN (NITROSTAT) 0.4 MG SL tablet Place 1 tablet (0.4 mg total) under the tongue every 5 (five) minutes as needed for chest pain.   OVER THE COUNTER MEDICATION Take 1 tablet by mouth daily at 12 noon. Calm Aid   potassium chloride (KLOR-CON M) 10 MEQ tablet TAKE ONE TABLET BY MOUTH ONE TIME DAILY AS NEEDED   potassium chloride (KLOR-CON) 10 MEQ tablet Take 10 mEq by mouth daily.   prasugrel (EFFIENT) 10 MG TABS tablet Take 1 tablet (10 mg total) by mouth daily.   sacubitril-valsartan (ENTRESTO) 24-26 MG Take 1 tablet by mouth 2 (two) times daily.   [DISCONTINUED] atorvastatin (LIPITOR) 80 MG tablet Take 1 tablet (80 mg total) by mouth daily.     Allergies:   Amiodarone  Social History   Socioeconomic History   Marital status: Divorced    Spouse name: Not on file   Number of children: Not on file   Years of education: Not on file   Highest education level: Not on file  Occupational History   Not on file  Tobacco Use   Smoking status: Former    Packs/day: 1.50    Years: 45.00    Total pack years: 67.50    Types: Cigarettes    Quit date: 12/20/2020    Years since quitting: 1.0   Smokeless tobacco: Never  Vaping Use   Vaping Use: Never used  Substance and Sexual Activity   Alcohol use: Not Currently   Drug use: Never   Sexual activity: Not on file  Other  Topics Concern   Not on file  Social History Narrative   Not on file   Social Determinants of Health   Financial Resource Strain: Not on file  Food Insecurity: Not on file  Transportation Needs: Not on file  Physical Activity: Not on file  Stress: Not on file  Social Connections: Not on file     Family History: The patient's family history is not on file.  ROS:   Please see the history of present illness.    All other systems reviewed and are negative.  EKGs/Labs/Other Studies Reviewed:    The following studies were reviewed today:   EKG:  The ekg ordered today demonstrates sinus rhythm with frequent PACs.  Recent Labs: 06/01/2021: ALT 25; B Natriuretic Peptide 41.3; BUN 19; Creatinine, Ser 1.50; Hemoglobin 14.8; Platelets 205; Potassium 4.2; Sodium 133  Recent Lipid Panel    Component Value Date/Time   CHOL 166 02/04/2021 0909   TRIG 148 02/04/2021 0909   HDL 38 (L) 02/04/2021 0909   CHOLHDL 4.4 02/04/2021 0909   CHOLHDL 8.0 12/21/2020 0543   VLDL 54 (H) 12/21/2020 0543   LDLCALC 102 (H) 02/04/2021 0909    Physical Exam:    VS:  BP (!) 142/68   Pulse 77   Ht 6' (1.829 m)   Wt 206 lb (93.4 kg)   SpO2 98%   BMI 27.94 kg/m     Wt Readings from Last 3 Encounters:  01/20/22 206 lb (93.4 kg)  07/08/21 207 lb (93.9 kg)  06/03/21 211 lb (95.7 kg)     GEN:  Well nourished, well developed in no acute distress HEENT: Normal NECK: No JVD; No carotid bruits LYMPHATICS: No lymphadenopathy CARDIAC: Irregular rhythm, no murmurs, rubs, gallops RESPIRATORY:  Clear to auscultation without rales, wheezing or rhonchi  ABDOMEN: Soft, non-tender, non-distended MUSCULOSKELETAL:  No edema; No deformity  SKIN: Warm and dry NEUROLOGIC:  Alert and oriented x 3 PSYCHIATRIC:  Normal affect        ASSESSMENT:    1. Chronic systolic heart failure ([place])   2. Torsades de pointes ([place])   3. Anxiety    PLAN:    In order of problems listed above:   #Chronic systolic  heart failure NYHA class II-III today.  Warm and dry on exam.  Continue current medical therapy including Lasix, Entresto.  He will continue aspirin and prasugrel for his coronary artery disease.  Also atorvastatin.  #Torsades de pointes In the setting of amiodarone and prolonged QT.  Avoid QT prolonging medications.  #Anxiety I suspect his anxiety continues to contribute to some of his symptoms.  He is very aware of this as well.   Follow-up 6 months with general cardiology.  Follow-up  12 months with EP APP.        Medication Adjustments/Labs and Tests Ordered: Current medicines are reviewed at length with the patient today.  Concerns regarding medicines are outlined above.  Orders Placed This Encounter  Procedures   EKG 12-Lead   Meds ordered this encounter  Medications   atorvastatin (LIPITOR) 80 MG tablet    Sig: Take 1 tablet (80 mg total) by mouth daily.    Dispense:  90 tablet    Refill:  3     Signed, Lars Mage, MD, [facility], [facility] 01/20/2022 6:18 PM    Electrophysiology [facility]

## 2022-01-20 NOTE — Patient Instructions (Signed)
Medication Instructions:  None  *If you need a refill on your cardiac medications before your next appointment, please call your pharmacy*   Lab Work: None  If you have labs (blood work) drawn today and your tests are completely normal, you will receive your results only by: [place] (if you have MyChart) OR A paper copy in the mail If you have any lab test that is abnormal or we need to change your treatment, we will call you to review the results.   Testing/Procedures: None    Follow-Up: At [facility], you and your health needs are our priority.  As part of our continuing mission to provide you with exceptional heart care, we have created designated Provider Care Teams.  These Care Teams include your primary Cardiologist (physician) and Advanced Practice Providers (APPs -  Physician Assistants and Nurse Practitioners) who all work together to provide you with the care you need, when you need it.  We recommend signing up for the patient portal called "MyChart".  Sign up information is provided on this After Visit Summary.  MyChart is used to connect with patients for Virtual Visits (Telemedicine).  Patients are able to view lab/test results, encounter notes, upcoming appointments, etc.  Non-urgent messages can be sent to your provider as well.   To learn more about what you can do with MyChart, go to [email or web address].    Your next appointment:   6 month(s)  The format for your next appointment:   In Person  Provider:   [name], MD   1 year with EP APP  Other Instructions None   Important Information About Sugar

## 2022-01-29 NOTE — Telephone Encounter (Signed)
Per dr Quentin Ore, patient doesn't need f/u appt with Dr. [name] or App

## 2022-02-03 ENCOUNTER — Ambulatory Visit: Payer: Self-pay | Admitting: Cardiology

## 2022-02-18 ENCOUNTER — Other Ambulatory Visit: Payer: Self-pay | Admitting: Cardiovascular Disease

## 2022-02-18 NOTE — Telephone Encounter (Signed)
Please schedule overdue 2 month F/U appt per 12/22 AVS. Thank you!

## 2022-03-04 ENCOUNTER — Encounter: Payer: Self-pay | Admitting: Cardiovascular Disease

## 2022-03-04 ENCOUNTER — Telehealth: Payer: Self-pay | Admitting: *Deleted

## 2022-03-04 ENCOUNTER — Ambulatory Visit: Payer: Medicare HMO | Attending: Cardiovascular Disease | Admitting: Cardiovascular Disease

## 2022-03-04 ENCOUNTER — Other Ambulatory Visit
Admission: RE | Admit: 2022-03-04 | Discharge: 2022-03-04 | Disposition: A | Discharge status: Home / Self Care | Payer: Medicare HMO | Source: Ambulatory Visit | Attending: Cardiovascular Disease | Admitting: Cardiovascular Disease

## 2022-03-04 VITALS — BP 126/72 | HR 72 | Ht 72.0 in | Wt 206.2 lb

## 2022-03-04 DIAGNOSIS — I5022 Chronic systolic (congestive) heart failure: Secondary | ICD-10-CM | POA: Diagnosis not present

## 2022-03-04 DIAGNOSIS — R0602 Shortness of breath: Secondary | ICD-10-CM | POA: Insufficient documentation

## 2022-03-04 DIAGNOSIS — R55 Syncope and collapse: Secondary | ICD-10-CM | POA: Diagnosis present

## 2022-03-04 DIAGNOSIS — E782 Mixed hyperlipidemia: Secondary | ICD-10-CM | POA: Diagnosis present

## 2022-03-04 DIAGNOSIS — I25118 Atherosclerotic heart disease of native coronary artery with other forms of angina pectoris: Secondary | ICD-10-CM

## 2022-03-04 DIAGNOSIS — I5023 Acute on chronic systolic (congestive) heart failure: Secondary | ICD-10-CM | POA: Diagnosis not present

## 2022-03-04 LAB — LIPID PANEL
Cholesterol: 130 mg/dL (ref 0–200)
HDL: 37 mg/dL — ABNORMAL LOW (ref >40)
LDL Cholesterol: 58 mg/dL (ref 0–99)
Total CHOL/HDL Ratio: 3.5 RATIO
Triglycerides: 175 mg/dL — ABNORMAL HIGH (ref <150)
VLDL: 35 mg/dL (ref 0–40)

## 2022-03-04 LAB — COMPREHENSIVE METABOLIC PANEL
ALT: 26 U/L (ref 0–44)
AST: 22 U/L (ref 15–41)
Albumin: 4 g/dL (ref 3.5–5.0)
Alkaline Phosphatase: 61 U/L (ref 38–126)
Anion gap: 9 (ref 5–15)
BUN: 16 mg/dL (ref 8–23)
CO2: 28 mmol/L (ref 22–32)
Calcium: 9.8 mg/dL (ref 8.9–10.3)
Chloride: 104 mmol/L (ref 98–111)
Creatinine, Ser: 1.44 mg/dL — ABNORMAL HIGH (ref 0.61–1.24)
GFR, Estimated: 54 mL/min — ABNORMAL LOW (ref >60)
Glucose, Bld: 101 mg/dL — ABNORMAL HIGH (ref 70–99)
Potassium: 4.5 mmol/L (ref 3.5–5.1)
Sodium: 141 mmol/L (ref 135–145)
Total Bilirubin: 0.7 mg/dL (ref 0.3–1.2)
Total Protein: 7.4 g/dL (ref 6.5–8.1)

## 2022-03-04 LAB — CBC
HCT: 44.8 % (ref 39.0–52.0)
Hemoglobin: 14.6 g/dL (ref 13.0–17.0)
MCH: 30.4 pg (ref 26.0–34.0)
MCHC: 32.6 g/dL (ref 30.0–36.0)
MCV: 93.1 fL (ref 80.0–100.0)
Platelets: 215 10*3/uL (ref 150–400)
RBC: 4.81 MIL/uL (ref 4.22–5.81)
RDW: 12.8 % (ref 11.5–15.5)
WBC: 6.1 10*3/uL (ref 4.0–10.5)
nRBC: 0 % (ref 0.0–0.2)

## 2022-03-04 LAB — BRAIN NATRIURETIC PEPTIDE: B Natriuretic Peptide: 85.5 pg/mL (ref 0.0–100.0)

## 2022-03-04 MED ORDER — CARVEDILOL 3.125 MG PO TABS
3.1250 mg | ORAL_TABLET | Freq: Two times a day (BID) | ORAL | 3 refills | Status: DC
Start: 2022-03-04 — End: 2022-09-17

## 2022-03-04 MED ORDER — ENTRESTO 24-26 MG PO TABS: 1.0000 | ORAL_TABLET | Freq: Two times a day (BID) | ORAL | 3 refills | Status: DC

## 2022-03-04 NOTE — Patient Instructions (Signed)
Medication Instructions:  START Carvedilol 3.125 mg twice daily  *If you need a refill on your cardiac medications before your next appointment, please call your pharmacy*   Lab Work: Your provider would like for you to have the following labs drawn: CMET, BNP, CBC and fasting lipid.   Please go to the [facility] entrance and check in at the front desk.  You do not need an appointment.  They are open from 7am-6 pm.  You will need to be fasting.    If you have labs (blood work) drawn today and your tests are completely normal, you will receive your results only by: [place] (if you have MyChart) OR A paper copy in the mail If you have any lab test that is abnormal or we need to change your treatment, we will call you to review the results.   Testing/Procedures: None ordered   Follow-Up: At [facility], you and your health needs are our priority.  As part of our continuing mission to provide you with exceptional heart care, we have created designated Provider Care Teams.  These Care Teams include your primary Cardiologist (physician) and Advanced Practice Providers (APPs -  Physician Assistants and Nurse Practitioners) who all work together to provide you with the care you need, when you need it.  We recommend signing up for the patient portal called "MyChart".  Sign up information is provided on this After Visit Summary.  MyChart is used to connect with patients for Virtual Visits (Telemedicine).  Patients are able to view lab/test results, encounter notes, upcoming appointments, etc.  Non-urgent messages can be sent to your provider as well.   To learn more about what you can do with MyChart, go to [email or web address].    Your next appointment:   4 month(s)  The format for your next appointment:   In Person  Provider:   You may see [name], MD or one of the following Advanced Practice Providers on your designated Care Team:   Murray Hodgkins, NP Christell Faith, PA-C Cadence Kathlen Mody, PA-C Gerrie Nordmann, NP

## 2022-03-04 NOTE — Telephone Encounter (Signed)
Entresto assistance faxed to [employer].  

## 2022-03-04 NOTE — Progress Notes (Signed)
Cardiology Office Note   Date:  03/04/2022   ID:  Darryl Gilbert, Darryl Gilbert 01/30/1956, MRN [id number]  PCP:  Lynnell Jude, MD  Cardiologist:   [name], MD   Chief Complaint  Patient presents with   Other    12 Month f/u c/o sob and fatigue. Meds reviewed verbally with pt.      History of Present Illness: Darryl Gilbert is a 66 y.o. male who presents for follow-up visit regarding coronary artery disease and chronic systolic heart failure. He has known history of essential hypertension, hyperlipidemia, chronic back pain and tobacco use. He presented in September 2022 with inferior ST elevation myocardial infarction.  Emergent cardiac catheterization showed codominant coronary arteries with thrombotic occlusion of the proximal left circumflex.  I performed successful angioplasty and drug-eluting stent placement to the left circumflex.  He had atrial fibrillation that was treated with amiodarone drip and then he converted to sinus rhythm.  Echocardiogram showed an EF of 50%.  While on amiodarone, he developed prolonged QT interval and frequent PVCs followed by polymorphic ventricular tachycardia that required multiple shocks.  Amiodarone was discontinued.  Repeat echocardiogram showed stable EF of 45 to 50%.  He was seen in October with chest pain and shortness of breath.  Thus, cardiac catheterization was repeated which showed patent left circumflex stent with no significant restenosis with an EF of 35 to 40%.  Metoprolol was discontinued during that time due to bradycardia with heart rate in the 40s. Echocardiogram in March 2023 showed an EF of 45%.  He has been doing reasonably well and denies chest pain.  However, over the last 3 months, he reports recurrent shortness of breath at rest and with exertion.  He also has cold sweats with activities.  He still seems to be very anxious.  He quit smoking after his myocardial infarction.   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    Hyperlipidemia LDL goal <70    Hypertension    Ischemic cardiomyopathy     Past Surgical History:  Procedure Laterality Date   CORONARY/GRAFT ACUTE MI REVASCULARIZATION N/A 12/20/2020   Procedure: Coronary/Graft Acute MI Revascularization;  Surgeon: Wellington Hampshire, MD;  Location: [place] [facility];  Service: Cardiovascular;  Laterality: N/A;   LEFT HEART CATH AND CORONARY ANGIOGRAPHY N/A 12/20/2020   Procedure: LEFT HEART CATH AND CORONARY ANGIOGRAPHY;  Surgeon: Wellington Hampshire, MD;  Location: [place] [facility];  Service: Cardiovascular;  Laterality: N/A;   LEFT HEART CATH AND CORONARY ANGIOGRAPHY N/A 02/23/2021   Procedure: LEFT HEART CATH AND CORONARY ANGIOGRAPHY;  Surgeon: Wellington Hampshire, MD;  Location: [place] [facility];  Service: Cardiovascular;  Laterality: N/A;     Current Outpatient Medications  Medication Sig Dispense Refill   aspirin 81 MG chewable tablet Chew 1 tablet (81 mg total) by mouth daily. 30 tablet 0   atorvastatin (LIPITOR) 80 MG tablet Take 1 tablet (80 mg total) by mouth daily. 90 tablet 3   buPROPion (WELLBUTRIN XL) 300 MG 24 hr tablet Take 300 mg by mouth daily.     carvedilol (COREG) 3.125 MG tablet Take 1 tablet (3.125 mg total) by mouth 2 (two) times daily. 180 tablet 3   esomeprazole (NEXIUM) 20 MG capsule Take 1 capsule (20 mg total) by mouth daily at  12 noon. 30 capsule 6   furosemide (LASIX) 20 MG tablet TAKE ONE TABLET BY MOUTH ONE TIME DAILY AS NEEDED FOR SHORTNESS OF BREATH AND SWELLING 90 tablet 0   HYDROcodone-acetaminophen (NORCO) 10-325 MG tablet Take 1 tablet by mouth 3 (three) times daily as needed.     nitroGLYCERIN (NITROSTAT) 0.4 MG SL tablet Place 1 tablet (0.4 mg total) under the tongue every 5 (five) minutes as needed for chest pain. 25 tablet 3   potassium chloride (KLOR-CON) 10  MEQ tablet Take 10 mEq by mouth daily.     potassium chloride (KLOR-CON) 10 MEQ tablet Take 1 tablet (10 mEq total) by mouth daily as needed. Please contact the office to schedule appointment for additional refills. 90 tablet 1   prasugrel (EFFIENT) 10 MG TABS tablet Take 1 tablet (10 mg total) by mouth daily. 30 tablet 2   sacubitril-valsartan (ENTRESTO) 24-26 MG Take 1 tablet by mouth 2 (two) times daily. 180 tablet 3   No current facility-administered medications for this visit.    Allergies:   Amiodarone    Social History:  The patient  reports that he quit smoking about 14 months ago. His smoking use included cigarettes. He has a 67.50 pack-year smoking history. He has never used smokeless tobacco. He reports that he does not currently use alcohol. He reports that he does not use drugs.   Family History:  The patient's family history is not on file.    ROS:  Please see the history of present illness.   Otherwise, review of systems are positive for none.   All other systems are reviewed and negative.    PHYSICAL EXAM: VS:  BP 126/72 (BP Location: Left Arm, Patient Position: Sitting, Cuff Size: Normal)   Pulse 72   Ht 6' (1.829 m)   Wt 206 lb 4 oz (93.6 kg)   SpO2 99%   BMI 27.97 kg/m  , BMI Body mass index is 27.97 kg/m. GEN: Well nourished, well developed, in no acute distress  HEENT: normal  Neck: no JVD, carotid bruits, or masses Cardiac: RRR; no murmurs, rubs, or gallops,no edema  Respiratory:  clear to auscultation bilaterally, normal work of breathing GI: soft, nontender, nondistended, + BS MS: no deformity or atrophy  Skin: warm and dry, no rash Neuro:  Strength and sensation are intact Psych: euthymic mood, full affect   EKG:  EKG is ordered today. The ekg ordered today demonstrates sinus rhythm with PACs.  No significant ST or T wave changes.   Recent Labs: 06/01/2021: ALT 25; B Natriuretic Peptide 41.3; BUN 19; Creatinine, Ser 1.50; Hemoglobin 14.8; Platelets  205; Potassium 4.2; Sodium 133    Lipid Panel    Component Value Date/Time   CHOL 166 02/04/2021 0909   TRIG 148 02/04/2021 0909   HDL 38 (L) 02/04/2021 0909   CHOLHDL 4.4 02/04/2021 0909   CHOLHDL 8.0 12/21/2020 0543   VLDL 54 (H) 12/21/2020 0543   LDLCALC 102 (H) 02/04/2021 0909      Wt Readings from Last 3 Encounters:  03/04/22 206 lb 4 oz (93.6 kg)  01/20/22 206 lb (93.4 kg)  07/08/21 207 lb (93.9 kg)          No data to display            ASSESSMENT AND PLAN:  1.  Coronary artery disease involving native coronary arteries: The patient reports recurrent exertional dyspnea without chest pain.  I reviewed the images of most recent cardiac catheterization in November  of last year which showed patent left circumflex stents with no obstructive disease.  I discussed with him different options of further ischemic cardiac evaluation including stress testing or repeat cardiac catheterization.  However, he feels that his symptoms are not significant enough to warrant this at this time.  He will monitor his symptoms for now and will have him follow-up in 4 months.  2 . chronic systolic heart failure due to ischemic cardiomyopathy: He reports for some exertional dyspnea.  I will check routine labs and also BNP.  Metoprolol was discontinued due to bradycardia and thus I will try small dose carvedilol 3.125 mg twice daily.  Continue Entresto.  3.  Hyperlipidemia: Continue high-dose atorvastatin.  I requested lipid and liver profile  4.  Atrial fibrillation in the setting of myocardial infarction: No evidence of recurrent atrial arrhythmia.    Disposition:   FU with me in 4 months  Signed,  [name], MD  03/04/2022 9:54 AM    [place]

## 2022-03-17 ENCOUNTER — Telehealth: Payer: Self-pay | Admitting: *Deleted

## 2022-03-17 ENCOUNTER — Encounter: Payer: Self-pay | Admitting: Cardiology

## 2022-03-17 NOTE — Telephone Encounter (Signed)
   Pre-operative Risk Assessment    Patient Name: Darryl Gilbert  DOB: 1955-04-23 MRN: [id number]     Request for Surgical Clearance    Procedure:  Dental Extraction - Amount of Teeth to be Pulled:  14 TEETH SURGICALLY EXTRACTED  Date of Surgery:  Clearance 03/19/22                                 Surgeon:  DR. Kennis Carina Surgeon's Group or Practice Name:  [facility]  Phone number:  [phone] Fax number:  [phone]   Type of Clearance Requested:   - Medical  - Pharmacy:  Hold Aspirin and Prasugrel (Effient)     Type of Anesthesia:   IV CONSCIOUS SEDATION   Additional requests/questions:    [name]   03/17/2022, 11:34 AM

## 2022-03-17 NOTE — Telephone Encounter (Signed)
   Patient Name: Darryl Gilbert  DOB: 01-21-1956 MRN: [id number]  Primary Cardiologist: [name], MD  Chart reviewed as part of pre-operative protocol coverage. Given past medical history and time since last visit, based on ACC/AHA guidelines, Darryl Gilbert is at acceptable risk for the planned procedure without further cardiovascular testing.   The patient was advised that if he develops new symptoms prior to surgery to contact our office to arrange for a follow-up visit, and he verbalized understanding.  May hold Effient and Aspirin 5-7 days prior to procedure per office protocols. If possible, remain on aspirin throughout procedural period but may hold if deemed necessary by surgical team.  I will route this recommendation to the requesting party via [facility] fax function and remove from pre-op pool.  Please call with questions.  Loel Dubonnet, NP 03/17/2022, 12:04 PM

## 2022-03-18 NOTE — Telephone Encounter (Signed)
Assistance has been approved through 04/19/2023

## 2022-03-22 MED ORDER — PRASUGREL HCL 10 MG PO TABS: 10.0000 mg | ORAL_TABLET | Freq: Every day | ORAL | 2 refills | Status: DC

## 2022-03-31 ENCOUNTER — Other Ambulatory Visit: Payer: Self-pay | Admitting: Cardiology

## 2022-04-08 ENCOUNTER — Other Ambulatory Visit: Payer: Self-pay

## 2022-04-08 MED ORDER — NITROGLYCERIN 0.4 MG SL SUBL: 0.4000 mg | SUBLINGUAL_TABLET | SUBLINGUAL | 3 refills | Status: AC | PRN

## 2022-04-19 DIAGNOSIS — Z8616 Personal history of COVID-19: Secondary | ICD-10-CM

## 2022-04-19 DIAGNOSIS — U071 COVID-19: Secondary | ICD-10-CM

## 2022-04-19 HISTORY — DX: Personal history of COVID-19: Z86.16

## 2022-04-19 HISTORY — DX: COVID-19: U07.1

## 2022-04-21 ENCOUNTER — Telehealth: Payer: Self-pay | Admitting: Cardiovascular Disease

## 2022-04-21 ENCOUNTER — Telehealth: Payer: Self-pay

## 2022-04-21 ENCOUNTER — Other Ambulatory Visit: Payer: Self-pay

## 2022-04-21 ENCOUNTER — Telehealth: Payer: Self-pay | Admitting: *Deleted

## 2022-04-21 DIAGNOSIS — Z1211 Encounter for screening for malignant neoplasm of colon: Secondary | ICD-10-CM

## 2022-04-21 DIAGNOSIS — R195 Other fecal abnormalities: Secondary | ICD-10-CM

## 2022-04-21 MED ORDER — NA SULFATE-K SULFATE-MG SULF 17.5-3.13-1.6 GM/177ML PO SOLN: 1.0000 | Freq: Once | ORAL | 0 refills | Status: AC

## 2022-04-21 NOTE — Telephone Encounter (Signed)
Pt has been scheduled for a tele visit, 04/27/22 9:40. Consent on file.  Pt will call back with medications, has he is on a new one and he couldn't remember what it was.  I will leave this note open until pt calls back.  Will send to preop call back.     Patient Consent for Virtual Visit        Darryl Gilbert has provided verbal consent on 04/21/2022 for a virtual visit (video or telephone).   CONSENT FOR VIRTUAL VISIT FOR:  Darryl Gilbert  By participating in this virtual visit I agree to the following:  I hereby voluntarily request, consent and authorize [facility] and its employed or contracted physicians, physician assistants, nurse practitioners or other licensed health care professionals (the Practitioner), to provide me with telemedicine health care services (the "Services") as deemed necessary by the treating Practitioner. I acknowledge and consent to receive the Services by the Practitioner via telemedicine. I understand that the telemedicine visit will involve communicating with the Practitioner through live audiovisual communication technology and the disclosure of certain medical information by electronic transmission. I acknowledge that I have been given the opportunity to request an in-person assessment or other available alternative prior to the telemedicine visit and am voluntarily participating in the telemedicine visit.  I understand that I have the right to withhold or withdraw my consent to the use of telemedicine in the course of my care at any time, without affecting my right to future care or treatment, and that the Practitioner or I may terminate the telemedicine visit at any time. I understand that I have the right to inspect all information obtained and/or recorded in the course of the telemedicine visit and may receive copies of available information for a reasonable fee.  I understand that some of the potential risks of receiving the Services via telemedicine include:   Delay or interruption in medical evaluation due to technological equipment failure or disruption; Information transmitted may not be sufficient (e.g. poor resolution of images) to allow for appropriate medical decision making by the Practitioner; and/or  In rare instances, security protocols could fail, causing a breach of personal health information.  Furthermore, I acknowledge that it is my responsibility to provide information about my medical history, conditions and care that is complete and accurate to the best of my ability. I acknowledge that Practitioner's advice, recommendations, and/or decision may be based on factors not within their control, such as incomplete or inaccurate data provided by me or distortions of diagnostic images or specimens that may result from electronic transmissions. I understand that the practice of medicine is not an exact science and that Practitioner makes no warranties or guarantees regarding treatment outcomes. I acknowledge that a copy of this consent can be made available to me via my patient portal ([place]), or I can request a printed copy by calling the office of [facility].    I understand that my insurance will be billed for this visit.   I have read or had this consent read to me. I understand the contents of this consent, which adequately explains the benefits and risks of the Services being provided via telemedicine.  I have been provided ample opportunity to ask questions regarding this consent and the Services and have had my questions answered to my satisfaction. I give my informed consent for the services to be provided through the use of telemedicine in my medical care

## 2022-04-21 NOTE — Telephone Encounter (Signed)
Pt has been scheduled for a tele visit, 04/27/22 9:40.  Consent on file.

## 2022-04-21 NOTE — Telephone Encounter (Signed)
Gastroenterology Pre-Procedure Review  Request Date: 05/07/22 Requesting Physician: Dr. Vicente Males  PATIENT REVIEW QUESTIONS: The patient responded to the following health history questions as indicated:    1. Are you having any GI issues? no 2. Do you have a personal history of Polyps? no 3. Do you have a family history of Colon Cancer or Polyps? no 4. Diabetes Mellitus? no 5. Joint replacements in the past 12 months?no 6. Major health problems in the past 3 months?yes (major heart attack 12/2020 Cardiologist Dr. [name] clearance to be sent Effient) 7. Any artificial heart valves, MVP, or defibrillator? Stent placed in 12/2020    MEDICATIONS & ALLERGIES:    Patient reports the following regarding taking any anticoagulation/antiplatelet therapy:   Plavix, Coumadin, Eliquis, Xarelto, Lovenox, Pradaxa, Brilinta, or Effient? yes (Effient) Aspirin? yes ('81mg'$ )  Patient confirms/reports the following medications:  Current Outpatient Medications  Medication Sig Dispense Refill   aspirin 81 MG chewable tablet Chew 1 tablet (81 mg total) by mouth daily. 30 tablet 0   atorvastatin (LIPITOR) 80 MG tablet Take 1 tablet (80 mg total) by mouth daily. 90 tablet 3   buPROPion (WELLBUTRIN XL) 300 MG 24 hr tablet Take 300 mg by mouth daily.     carvedilol (COREG) 3.125 MG tablet Take 1 tablet (3.125 mg total) by mouth 2 (two) times daily. 180 tablet 3   esomeprazole (NEXIUM) 20 MG capsule Take 1 capsule (20 mg total) by mouth daily at 12 noon. 30 capsule 6   furosemide (LASIX) 20 MG tablet TAKE ONE TABLET BY MOUTH ONE TIME DAILY AS NEEDED FOR SHORTNESS OF BREATH AND SWELLING 90 tablet 0   HYDROcodone-acetaminophen (NORCO) 10-325 MG tablet Take 1 tablet by mouth 3 (three) times daily as needed.     nitroGLYCERIN (NITROSTAT) 0.4 MG SL tablet Place 1 tablet (0.4 mg total) under the tongue every 5 (five) minutes as needed for chest pain. 25 tablet 3   potassium chloride (KLOR-CON) 10 MEQ tablet Take 10 mEq by  mouth daily.     potassium chloride (KLOR-CON) 10 MEQ tablet Take 1 tablet (10 mEq total) by mouth daily as needed. Please contact the office to schedule appointment for additional refills. 90 tablet 1   prasugrel (EFFIENT) 10 MG TABS tablet Take 1 tablet (10 mg total) by mouth daily. 30 tablet 2   sacubitril-valsartan (ENTRESTO) 24-26 MG Take 1 tablet by mouth 2 (two) times daily. 180 tablet 3   No current facility-administered medications for this visit.    Patient confirms/reports the following allergies:  Allergies  Allergen Reactions   Amiodarone Other (See Comments)    QT prolongation in the context of inferior STEMI and IV amiodarone use. Avoid all QT prolonging medications.     No orders of the defined types were placed in this encounter.   AUTHORIZATION INFORMATION Primary Insurance: 1D#: Group #:  Secondary Insurance: 1D#: Group #:  SCHEDULE INFORMATION: Date: 05/07/22 Time: Location: [facility]

## 2022-04-21 NOTE — Telephone Encounter (Signed)
Call placed to pt regarding surgical clearance, per wife, pt is at work and she will have him call back.  He was advised to ask for the preop team.

## 2022-04-21 NOTE — Telephone Encounter (Signed)
   Pre-operative Risk Assessment    Patient Name: Darryl Gilbert  DOB: March 17, 1956 MRN: [id number]      Request for Surgical Clearance    Procedure:   COLONOSCOPY  Date of Surgery:  Clearance 05/07/22                               Surgeon's Group or Practice Name:  [facility] Phone number:  [phone] Fax number:  [phone]   Type of Clearance Requested:   - Medical  PHARMACY-ADVICE ON EFFIENT Type of Anesthesia:  General    Additional requests/questions:    [name]   04/21/2022, 11:29 AM

## 2022-04-21 NOTE — Telephone Encounter (Signed)
   Name: Darryl Gilbert  DOB: 25-Nov-1955  MRN: [id number]  Primary Cardiologist: [name], MD   Preoperative team, please contact this patient and set up a phone call appointment for further preoperative risk assessment. Please obtain consent and complete medication review. Thank you for your help.  I confirm that guidance regarding antiplatelet and oral anticoagulation therapy has been completed and, if necessary, noted below.  May hold Effient and Aspirin 5-7 days prior to procedure per office protocols. If possible, remain on aspirin throughout procedural period but may hold if deemed necessary by surgical team.    Mable Fill, Marissa Nestle, NP 04/21/2022, 12:01 PM [place]

## 2022-04-22 ENCOUNTER — Telehealth: Payer: Self-pay | Admitting: *Deleted

## 2022-04-22 NOTE — Telephone Encounter (Signed)
I gave the pt a call to see if he did have time to go over his medications. Pt said not really, but he did tell me that a new one that he is on is Tamsulosin (Flomax). I have updated the med list at this time. Pt said he will have to go over the other meds at the tele visit.

## 2022-04-27 ENCOUNTER — Ambulatory Visit: Payer: 59 | Attending: Internal Medicine | Admitting: Physician Assistant

## 2022-04-27 ENCOUNTER — Ambulatory Visit (INDEPENDENT_AMBULATORY_CARE_PROVIDER_SITE_OTHER): Payer: 59

## 2022-04-27 DIAGNOSIS — R0602 Shortness of breath: Secondary | ICD-10-CM

## 2022-04-27 LAB — ECHOCARDIOGRAM COMPLETE
AR max vel: 3.84 cm2
AV Area VTI: 3.48 cm2
AV Area mean vel: 3.76 cm2
AV Mean grad: 2 mmHg
AV Peak grad: 4.1 mmHg
Ao pk vel: 1.01 m/s
Area-P 1/2: 3.54 cm2
Calc EF: 62.6 %
S' Lateral: 3.7 cm
Single Plane A2C EF: 61.2 %
Single Plane A4C EF: 63.6 %

## 2022-04-27 NOTE — Telephone Encounter (Signed)
Per pre op APP pt needs echo to be done before he can be cleared for procedure. I will send message to echo scheduler to reach out to the pt with an appt for echo for pre op clearance. Order has been placed. I will update the requesting office that the pt needs cardiac testing before being cleared.

## 2022-04-27 NOTE — Progress Notes (Signed)
Virtual Visit via Telephone Note   Because of Darryl Gilbert's co-morbid illnesses, he is at least at moderate risk for complications without adequate follow up.  This format is felt to be most appropriate for this patient at this time.  The patient did not have access to video technology/had technical difficulties with video requiring transitioning to audio format only (telephone).  All issues noted in this document were discussed and addressed.  No physical exam could be performed with this format.  Please refer to the patient's chart for his consent to telehealth for [facility].  Evaluation Performed:  Preoperative cardiovascular risk assessment _____________   Date:  04/27/2022   Patient ID:  Darryl Gilbert, Darryl Gilbert, Darryl Gilbert, Darryl Gilbert Patient Location:  Home Provider location:   Office  Primary Care Provider:  Lynnell Jude, MD Primary Cardiologist:  [name], MD  Chief Complaint / Patient Profile   67 y.o. 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  History of Present Illness    Darryl Gilbert is a 67 y.o. male who presents via audio/video conferencing for a telehealth visit today.  Pt was last seen in cardiology clinic on 03/04/2022 by Dr. [name].  At that time Darryl Gilbert was having some shortness of breath and fatigue.  The patient is now pending procedure as outlined above. Since his last visit, he continues to have shortness of breath and fatigue.  He cannot walk 2 blocks.  He has to take frequent breaks when doing tasks around his house.  He has been dealing with the symptoms since his heart attack back in September 2022.  He was recently started on Coreg at his last appointment and has been on Entresto  as well as Lasix.  He does not have any lower extremity edema.  He does not have any chest pain or palpitations.  His DASI barely exceeds the 4 METS minimum requirement.  We have decided to go forward with an echocardiogram prior to clearance.  May hold Effient and Aspirin 5-7 days prior to procedure per office protocols. If possible, remain on aspirin throughout procedural period but may hold if deemed necessary by surgical team.    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    Hyperlipidemia LDL goal <70    Hypertension    Ischemic cardiomyopathy    Past Surgical History:  Procedure Laterality Date   CORONARY/GRAFT ACUTE MI REVASCULARIZATION N/A 12/20/2020   Procedure: Coronary/Graft Acute MI Revascularization;  Surgeon: Wellington Hampshire, MD;  Location: [place] [facility];  Service: Cardiovascular;  Laterality: N/A;   LEFT HEART CATH AND CORONARY ANGIOGRAPHY N/A 12/20/2020   Procedure: LEFT HEART CATH AND CORONARY ANGIOGRAPHY;  Surgeon: Wellington Hampshire, MD;  Location: [place] [facility];  Service: Cardiovascular;  Laterality: N/A;   LEFT HEART CATH AND CORONARY ANGIOGRAPHY N/A 02/23/2021   Procedure: LEFT HEART CATH AND CORONARY ANGIOGRAPHY;  Surgeon: Wellington Hampshire, MD;  Location: [place] [facility];  Service: Cardiovascular;  Laterality: N/A;    Allergies  Allergies  Allergen Reactions   Amiodarone Other (  See Comments)    QT prolongation in the context of inferior STEMI and IV amiodarone use. Avoid all QT prolonging medications.     Home Medications    Prior to Admission medications   Medication Sig Start Date End Date Taking? Authorizing Provider  aspirin 81 MG chewable tablet Chew 1 tablet (81 mg total) by mouth daily. 12/26/20   Aline August, MD  atorvastatin (LIPITOR) 80 MG tablet Take 1  tablet (80 mg total) by mouth daily. 01/20/22   Vickie Epley, MD  buPROPion (WELLBUTRIN XL) 300 MG 24 hr tablet Take 300 mg by mouth daily. 01/20/22   [provider]  carvedilol (COREG) 3.125 MG tablet Take 1 tablet (3.125 mg total) by mouth 2 (two) times daily. 03/04/22 02/27/23  Wellington Hampshire, MD  esomeprazole (NEXIUM) 20 MG capsule Take 1 capsule (20 mg total) by mouth daily at 12 noon. 12/31/20   Dunn, Areta Haber, PA-C  furosemide (LASIX) 20 MG tablet TAKE ONE TABLET BY MOUTH ONE TIME DAILY AS NEEDED FOR SHORTNESS OF BREATH AND SWELLING 03/31/22   Vickie Epley, MD  HYDROcodone-acetaminophen [facility]) 10-325 MG tablet Take 1 tablet by mouth 3 (three) times daily as needed. 12/25/21   [provider]  nitroGLYCERIN (NITROSTAT) 0.4 MG SL tablet Place 1 tablet (0.4 mg total) under the tongue every 5 (five) minutes as needed for chest pain. 04/08/22 07/07/22  Rise Mu, PA-C  potassium chloride (KLOR-CON) 10 MEQ tablet Take 10 mEq by mouth daily.    [provider]  potassium chloride (KLOR-CON) 10 MEQ tablet Take 1 tablet (10 mEq total) by mouth daily as needed. Please contact the office to schedule appointment for additional refills. 02/19/22   Vickie Epley, MD  prasugrel (EFFIENT) 10 MG TABS tablet Take 1 tablet (10 mg total) by mouth daily. 03/22/22   Wellington Hampshire, MD  sacubitril-valsartan (ENTRESTO) 24-26 MG Take 1 tablet by mouth 2 (two) times daily. 03/04/22   Wellington Hampshire, MD  tamsulosin (FLOMAX) 0.4 MG CAPS capsule Take 0.4 mg by mouth.    [provider]    Physical Exam    Vital Signs:  Darryl Gilbert does not have vital signs available for review today.  Given telephonic nature of communication, physical exam is limited. AAOx3. NAD. Normal affect.  Speech and respirations are unlabored.  Accessory Clinical Findings    None  Assessment & Plan    1.  Preoperative Cardiovascular Risk Assessment:  Darryl Gilbert perioperative  risk of a major cardiac event is 6.6% according to the Revised Cardiac Risk Index (RCRI).  Therefore, he is at high risk for perioperative complications.   His functional capacity is poor at 4.61 METs according to the Duke Activity Status Index (DASI). Recommendations: The patient requires an echocardiogram before a disposition can be made regarding surgical risk.                Antiplatelet and/or Anticoagulation Recommendations: Aspirin can be held for 5-7 days prior to his surgery.  Please resume Aspirin post operatively when it is felt to be safe from a bleeding standpoint.  Prasugrel (Effient) can be held for 5-7 days prior to his surgery and resumed as soon as possible post op.  The patient was advised that if he develops new symptoms prior to surgery to contact our office to arrange for a follow-up visit, and he verbalized understanding.   A copy of this note will be routed to requesting surgeon.  Time:  Today, I have spent 15 minutes with the patient with telehealth technology discussing medical history, symptoms, and management plan.     Elgie Collard, PA-C  04/27/2022, 9:42 AM

## 2022-04-28 ENCOUNTER — Telehealth: Payer: Self-pay

## 2022-04-28 NOTE — Telephone Encounter (Signed)
Patient has been advise the following per Clearance received from [place] on 04/27/22: May hold Effient and Aspirin 5-7 days prior to procedure per office protocols. If possible, remain on aspirin throughout procedural period but may hold if deemed necessary by surgical team.  Patient verbalized understanding and was already aware.    Thanks,  [place], [place]

## 2022-04-28 NOTE — Telephone Encounter (Signed)
Discussed additional clearance note received from [name] via secure chat with [name], [facility] and CMA-[name].   Darryl Gilbert has advised that patient will need to have a stress test prior to 05/07/22 colonoscopy to be cleared.  I will follow up next week for update on cardiac clearance.  Thanks,  [place], [place]

## 2022-05-03 ENCOUNTER — Telehealth: Payer: Self-pay

## 2022-05-03 DIAGNOSIS — I519 Heart disease, unspecified: Secondary | ICD-10-CM

## 2022-05-03 NOTE — Addendum Note (Signed)
Addended by: Ulice Brilliant T on: 05/03/2022 02:48 PM   Modules accepted: Orders

## 2022-05-03 NOTE — Telephone Encounter (Signed)
Reached out the patient and she states he does not want to come to [facility] for a [place]. He states he would like to have his test done in [place].    Test has been ordered and message sent to the schedulers to contact the patient. Patient states he already has the instructions for the test.

## 2022-05-03 NOTE — Telephone Encounter (Signed)
   Patient Name: SEON GAERTNER  DOB: 23-Jun-1955 MRN: [id number]  Primary Cardiologist: [name], MD  Chart reviewed as part of pre-operative protocol coverage.   Mr. Marszalek recently had echo completed for surgical clearance and now will require a Lexiscan Myoview per Dr. [name] prior to clearance.  Please contact patient and assist in ordering required testing prior to preoperative clearance.    Mable Fill, Marissa Nestle, NP 05/03/2022, 1:23 PM

## 2022-05-03 NOTE — Telephone Encounter (Signed)
   Pre-operative Risk Assessment    Patient Name: Darryl Gilbert  DOB: [birth year] MRN: [id number]      Request for Surgical Clearance    Procedure:   Colonoscopy  Date of Surgery:  Clearance 05/07/22                                 Surgeon:  Dr Jonathon Bellows  Surgeon's Group or Practice Name:  [facility] Phone number:  [phone] Fax number:  [phone]   Type of Clearance Requested:   - Medical  - Pharmacy:  Hold Prasugrel (Effient)     Type of Anesthesia:  General    Additional requests/questions:   N/A  Job Founds T   05/03/2022, 11:31 AM

## 2022-05-05 NOTE — Telephone Encounter (Signed)
Cardiac clearance still pending. Pt said he will have cardiac testing on tomorrow 05/06/22 am.  Monica Becton, CMA

## 2022-05-06 ENCOUNTER — Encounter
Admission: RE | Admit: 2022-05-06 | Discharge: 2022-05-06 | Disposition: A | Discharge status: Home / Self Care | Payer: 59 | Source: Ambulatory Visit | Attending: Cardiovascular Disease | Admitting: Cardiovascular Disease

## 2022-05-06 DIAGNOSIS — I519 Heart disease, unspecified: Secondary | ICD-10-CM

## 2022-05-06 DIAGNOSIS — I2510 Atherosclerotic heart disease of native coronary artery without angina pectoris: Secondary | ICD-10-CM | POA: Insufficient documentation

## 2022-05-06 DIAGNOSIS — Z955 Presence of coronary angioplasty implant and graft: Secondary | ICD-10-CM | POA: Diagnosis not present

## 2022-05-06 DIAGNOSIS — I252 Old myocardial infarction: Secondary | ICD-10-CM | POA: Diagnosis not present

## 2022-05-06 LAB — NM MYOCAR MULTI W/SPECT W/WALL MOTION / EF
LV dias vol: 95 mL (ref 62–150)
LV sys vol: 44 mL
Nuc Stress EF: 54 %
Peak HR: 91 {beats}/min
Percent HR: 58 %
Rest HR: 57 {beats}/min
Rest Nuclear Isotope Dose: 10.2 mCi
SDS: 1
SRS: 10
SSS: 5
ST Depression (mm): 0 mm
Stress Nuclear Isotope Dose: 30.2 mCi
TID: 1

## 2022-05-06 MED ORDER — REGADENOSON 0.4 MG/5ML IV SOLN
0.4000 mg | Freq: Once | INTRAVENOUS | Status: AC
  Administered 2022-05-06: 0.4 mg via INTRAVENOUS

## 2022-05-06 MED ORDER — TECHNETIUM TC 99M TETROFOSMIN IV KIT
10.0000 | PACK | Freq: Once | INTRAVENOUS | Status: AC | PRN
  Administered 2022-05-06: 10.16 via INTRAVENOUS

## 2022-05-06 MED ORDER — TECHNETIUM TC 99M TETROFOSMIN IV KIT
30.2100 | PACK | Freq: Once | INTRAVENOUS | Status: AC | PRN
  Administered 2022-05-06: 30.21 via INTRAVENOUS

## 2022-05-06 NOTE — Telephone Encounter (Signed)
   Name: Darryl Gilbert  DOB: May 19, 1955  MRN: [id number]   Primary Cardiologist: [name], MD  Chart reviewed as part of pre-operative protocol coverage.   Due to high surgical risk and poor functional capacity, and echocardiogram and nuclear stress test were obtained. Echocardiogram showed a reduction in his LVEF. Fortunately, nuclear stress test did not show evidence of ischemia.   Therefore, based on ACC/AHA guidelines, the patient would be at acceptable risk for the planned procedure without further cardiovascular testing.   I will route this recommendation to the requesting party via Epic fax function and remove from pre-op pool. Please call with questions.  [place], PA 05/06/2022, 6:05 PM

## 2022-05-06 NOTE — Telephone Encounter (Signed)
Contacted patient to let him know that we have not received cardiac clearance for his colonoscopy.  He had his cardiac testing this morning which was required for clearance.  Pt states that he has started drinking his prep.  I informed him that if his cardiologist clears him the notation will be show up in his chart, however if clearance is not received his procedure may be canceled tomorrow.  He said he will send a mychart message to Dr. [name].   Thanks,  [place], [place]

## 2022-05-07 ENCOUNTER — Telehealth: Payer: Self-pay

## 2022-05-07 ENCOUNTER — Ambulatory Visit
Admission: RE | Admit: 2022-05-07 | Discharge: 2022-05-07 | Disposition: A | Discharge status: Home / Self Care | Payer: 59 | Source: Ambulatory Visit | Attending: Gastroenterology | Admitting: Gastroenterology

## 2022-05-07 ENCOUNTER — Encounter: Payer: Self-pay | Admitting: Gastroenterology

## 2022-05-07 ENCOUNTER — Other Ambulatory Visit: Payer: Self-pay

## 2022-05-07 ENCOUNTER — Ambulatory Visit: Payer: 59 | Admitting: Anesthesiology

## 2022-05-07 ENCOUNTER — Encounter
Admission: RE | Disposition: A | Discharge status: Home / Self Care | Payer: Self-pay | Source: Ambulatory Visit | Attending: Gastroenterology

## 2022-05-07 DIAGNOSIS — Z87891 Personal history of nicotine dependence: Secondary | ICD-10-CM | POA: Insufficient documentation

## 2022-05-07 DIAGNOSIS — D122 Benign neoplasm of ascending colon: Secondary | ICD-10-CM | POA: Insufficient documentation

## 2022-05-07 DIAGNOSIS — I2510 Atherosclerotic heart disease of native coronary artery without angina pectoris: Secondary | ICD-10-CM | POA: Insufficient documentation

## 2022-05-07 DIAGNOSIS — D126 Benign neoplasm of colon, unspecified: Secondary | ICD-10-CM | POA: Diagnosis not present

## 2022-05-07 DIAGNOSIS — R195 Other fecal abnormalities: Secondary | ICD-10-CM

## 2022-05-07 DIAGNOSIS — K219 Gastro-esophageal reflux disease without esophagitis: Secondary | ICD-10-CM | POA: Diagnosis not present

## 2022-05-07 DIAGNOSIS — I509 Heart failure, unspecified: Secondary | ICD-10-CM | POA: Diagnosis not present

## 2022-05-07 DIAGNOSIS — I110 Hypertensive heart disease with heart failure: Secondary | ICD-10-CM | POA: Insufficient documentation

## 2022-05-07 DIAGNOSIS — I252 Old myocardial infarction: Secondary | ICD-10-CM | POA: Insufficient documentation

## 2022-05-07 DIAGNOSIS — Z1211 Encounter for screening for malignant neoplasm of colon: Secondary | ICD-10-CM | POA: Insufficient documentation

## 2022-05-07 DIAGNOSIS — I255 Ischemic cardiomyopathy: Secondary | ICD-10-CM | POA: Diagnosis not present

## 2022-05-07 DIAGNOSIS — D120 Benign neoplasm of cecum: Secondary | ICD-10-CM | POA: Insufficient documentation

## 2022-05-07 HISTORY — PX: COLONOSCOPY WITH PROPOFOL: SHX5780

## 2022-05-07 SURGERY — COLONOSCOPY WITH PROPOFOL
Anesthesia: General

## 2022-05-07 MED ORDER — EPHEDRINE 5 MG/ML INJ
INTRAVENOUS | Status: AC
  Filled 2022-05-07: qty 5

## 2022-05-07 MED ORDER — LIDOCAINE HCL (PF) 2 % IJ SOLN
INTRAMUSCULAR | Status: AC
  Filled 2022-05-07: qty 5

## 2022-05-07 MED ORDER — LIDOCAINE HCL (CARDIAC) PF 100 MG/5ML IV SOSY
PREFILLED_SYRINGE | INTRAVENOUS | Status: DC | PRN
  Administered 2022-05-07 (×2): 100 mg via INTRAVENOUS

## 2022-05-07 MED ORDER — PROPOFOL 10 MG/ML IV BOLUS
INTRAVENOUS | Status: DC | PRN
  Administered 2022-05-07: 100 mg via INTRAVENOUS
  Administered 2022-05-07: 120 ug/kg/min via INTRAVENOUS

## 2022-05-07 MED ORDER — GLYCOPYRROLATE 0.2 MG/ML IJ SOLN
INTRAMUSCULAR | Status: AC
  Filled 2022-05-07: qty 1

## 2022-05-07 MED ORDER — EPHEDRINE SULFATE (PRESSORS) 50 MG/ML IJ SOLN
INTRAMUSCULAR | Status: DC | PRN
  Administered 2022-05-07 (×2): 5 mg via INTRAVENOUS

## 2022-05-07 MED ORDER — SODIUM CHLORIDE (PF) 0.9 % IJ SOLN
INTRAMUSCULAR | Status: DC | PRN
  Administered 2022-05-07: 6 mL

## 2022-05-07 MED ORDER — PROPOFOL 10 MG/ML IV BOLUS
INTRAVENOUS | Status: AC
  Filled 2022-05-07: qty 40

## 2022-05-07 MED ORDER — SODIUM CHLORIDE 0.9 % IV SOLN: INTRAVENOUS | Status: DC

## 2022-05-07 MED ORDER — GLYCOPYRROLATE 0.2 MG/ML IJ SOLN
INTRAMUSCULAR | Status: DC | PRN
  Administered 2022-05-07: .2 mg via INTRAVENOUS

## 2022-05-07 NOTE — Anesthesia Preprocedure Evaluation (Addendum)
Anesthesia Evaluation  Patient identified by MRN, date of birth, ID band Patient awake    Reviewed: Allergy & Precautions, NPO status , Patient's Chart, lab work & pertinent test results  History of Anesthesia Complications Negative for: history of anesthetic complications  Airway Mallampati: IV   Neck ROM: Full    Dental  (+) Edentulous Upper   Pulmonary former smoker (quit 2022)   Pulmonary exam normal breath sounds clear to auscultation       Cardiovascular hypertension, + CAD (s/p MI and PCI on Effient) and +CHF (ischemic cardiomyopathy, EF 48%)  Normal cardiovascular exam Rhythm:Regular Rate:Normal  Echo 04/27/22:   1. Left ventricular ejection fraction, by estimation, is 35 to 40%. The left ventricle has moderately decreased function. The left ventricle demonstrates global hypokinesis with severe hypokinesis of the inferior wall. The left ventricular internal cavity size was mildly dilated. Left ventricular diastolic parameters are consistent with Grade II diastolic dysfunction (pseudonormalization).   2. Right ventricular systolic function is normal. The right ventricular size is normal.   3. The mitral valve is normal in structure. Mild to moderate mitral valve regurgitation. No evidence of mitral stenosis.   4. The aortic valve has an indeterminant number of cusps. Aortic valve regurgitation is not visualized. No aortic stenosis is present.   5. The inferior vena cava is normal in size with greater than 50% respiratory variability, suggesting right atrial pressure of 3 mmHg.  Comparison(s): 06/25/21-EF 45%.   Myocardial perfusion 05/06/22:  Pharmacological myocardial perfusion imaging study with no significant  ischemia Moderate-sized fixed perfusion defect in the basal to mid lateral wall consistent with prior MI Hypokinesis of the basal to mid lateral wall, EF estimated at 48%, GI uptake artifact noted No EKG changes concerning  for ischemia at peak stress or in recovery. CT attenuation correction images with coronary disease and stenting in the left circumflex, mild coronary calcification in the LAD, no significant aortic atherosclerosis Low risk scan   Neuro/Psych negative neurological ROS     GI/Hepatic ,GERD  ,,  Endo/Other  negative endocrine ROS    Renal/GU negative Renal ROS     Musculoskeletal   Abdominal   Peds  Hematology negative hematology ROS (+)   Anesthesia Other Findings Cardiology note 04/27/22:  1.  Preoperative Cardiovascular Risk Assessment:   Mr. Tomey perioperative risk of a major cardiac event is 6.6% according to the Revised Cardiac Risk Index (RCRI).  Therefore, he is at high risk for perioperative complications.   His functional capacity is poor at 4.61 METs according to the Duke Activity Status Index (DASI). Recommendations: The patient requires an echocardiogram before a disposition can be made regarding surgical risk.                Antiplatelet and/or Anticoagulation Recommendations: Aspirin can be held for 5-7 days prior to his surgery.  Please resume Aspirin post operatively when it is felt to be safe from a bleeding standpoint.  Prasugrel (Effient) can be held for 5-7 days prior to his surgery and resumed as soon as possible post op.   The patient was advised that if he develops new symptoms prior to surgery to contact our office to arrange for a follow-up visit, and he verbalized understanding.   Reproductive/Obstetrics                             Anesthesia Physical Anesthesia Plan  ASA: 3  Anesthesia Plan: General   Post-op  Pain Management:    Induction: Intravenous  PONV Risk Score and Plan: 2 and Propofol infusion, TIVA and Treatment may vary due to age or medical condition  Airway Management Planned: Natural Airway  Additional Equipment:   Intra-op Plan:   Post-operative Plan:   Informed Consent: I have reviewed the  patients History and Physical, chart, labs and discussed the procedure including the risks, benefits and alternatives for the proposed anesthesia with the patient or authorized representative who has indicated his/her understanding and acceptance.       Plan Discussed with: CRNA  Anesthesia Plan Comments: (LMA/GETA backup discussed.  Patient consented for risks of anesthesia including but not limited to:  - adverse reactions to medications - damage to eyes, teeth, lips or other oral mucosa - nerve damage due to positioning  - sore throat or hoarseness - damage to heart, brain, nerves, lungs, other parts of body or loss of life  Informed patient about role of CRNA in peri- and intra-operative care.  Patient voiced understanding.)        Anesthesia Quick Evaluation

## 2022-05-07 NOTE — Transfer of Care (Signed)
Immediate Anesthesia Transfer of Care Note  Patient: Darryl Gilbert  Procedure(s) Performed: COLONOSCOPY WITH PROPOFOL  Patient Location: Endoscopy Unit  Anesthesia Type:General  Level of Consciousness: drowsy  Airway & Oxygen Therapy: Patient Spontanous Breathing  Post-op Assessment: Report given to RN and Post -op Vital signs reviewed and stable  Post vital signs: Reviewed and stable  Last Vitals:  Vitals Value Taken Time  BP 114/57 05/07/22 0958  Temp 36.1 C 05/07/22 0957  Pulse 50 05/07/22 0958  Resp 25 05/07/22 0958  SpO2 100 % 05/07/22 0958  Vitals shown include unvalidated device data.  Last Pain:  Vitals:   05/07/22 0957  TempSrc: Temporal  PainSc: Asleep         Complications: No notable events documented.

## 2022-05-07 NOTE — H&P (Signed)
Darryl Bellows, MD [address], [place], [place], [place], [address] [address], [place], [place], [place], [address] Phone: [phone]  Fax: [phone]  Primary Care Physician:  Lynnell Jude, MD   Pre-Procedure History & Physical: HPI:  Darryl Gilbert is a 67 y.o. male is here for an colonoscopy.   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    Hyperlipidemia LDL goal <70    Hypertension    Ischemic cardiomyopathy     Past Surgical History:  Procedure Laterality Date   CORONARY/GRAFT ACUTE MI REVASCULARIZATION N/A 12/20/2020   Procedure: Coronary/Graft Acute MI Revascularization;  Surgeon: Wellington Hampshire, MD;  Location: [facility];  Service: Cardiovascular;  Laterality: N/A;   FRACTURE SURGERY     Left leg fracture, rod placement   LEFT HEART CATH AND CORONARY ANGIOGRAPHY N/A 12/20/2020   Procedure: LEFT HEART CATH AND CORONARY ANGIOGRAPHY;  Surgeon: Wellington Hampshire, MD;  Location: [facility];  Service: Cardiovascular;  Laterality: N/A;   LEFT HEART CATH AND CORONARY ANGIOGRAPHY N/A 02/23/2021   Procedure: LEFT HEART CATH AND CORONARY ANGIOGRAPHY;  Surgeon: Wellington Hampshire, MD;  Location: [facility];  Service: Cardiovascular;  Laterality: N/A;    Prior to Admission medications   Medication Sig Start Date End Date Taking? Authorizing Provider  aspirin 81 MG chewable tablet Chew 1 tablet (81 mg total) by mouth daily. 12/26/20  Yes Aline August, MD  atorvastatin (LIPITOR) 80 MG tablet Take 1 tablet (80 mg total) by mouth daily. 01/20/22  Yes Vickie Epley, MD  buPROPion (WELLBUTRIN XL) 300 MG 24 hr tablet Take 300 mg by mouth daily. 01/20/22  Yes [provider]  carvedilol (COREG) 3.125 MG tablet Take 1 tablet (3.125 mg total) by mouth 2 (two) times daily. 03/04/22  02/27/23 Yes Wellington Hampshire, MD  esomeprazole (NEXIUM) 20 MG capsule Take 1 capsule (20 mg total) by mouth daily at 12 noon. 12/31/20  Yes Dunn, Areta Haber, PA-C  furosemide (LASIX) 20 MG tablet TAKE ONE TABLET BY MOUTH ONE TIME DAILY AS NEEDED FOR SHORTNESS OF BREATH AND SWELLING 03/31/22  Yes Vickie Epley, MD  HYDROcodone-acetaminophen [facility]) 10-325 MG tablet Take 1 tablet by mouth 3 (three) times daily as needed. 12/25/21  Yes [provider]  potassium chloride (KLOR-CON) 10 MEQ tablet Take 10 mEq by mouth daily.   Yes [provider]  sacubitril-valsartan (ENTRESTO) 24-26 MG Take 1 tablet by mouth 2 (two) times daily. 03/04/22  Yes Wellington Hampshire, MD  tamsulosin (FLOMAX) 0.4 MG CAPS capsule Take 0.4 mg by mouth.   Yes [provider]  nitroGLYCERIN (NITROSTAT) 0.4 MG SL tablet Place 1 tablet (0.4 mg total) under the tongue every 5 (five) minutes as needed for chest pain. 04/08/22 07/07/22  Rise Mu, PA-C  potassium chloride (KLOR-CON) 10 MEQ tablet Take 1 tablet (10 mEq total) by mouth daily as needed. Please contact the office to schedule appointment for additional refills. 02/19/22   Vickie Epley, MD  prasugrel (EFFIENT) 10 MG TABS tablet Take 1 tablet (10 mg total) by mouth daily. 03/22/22   Wellington Hampshire, MD    Allergies as of 04/21/2022 - Review Complete 04/21/2022  Allergen Reaction Noted   Amiodarone Other (See Comments) 12/25/2020  History reviewed. No pertinent family history.  Social History   Socioeconomic History   Marital status: Divorced    Spouse name: Not on file   Number of children: Not on file   Years of education: Not on file   Highest education level: Not on file  Occupational History   Not on file  Tobacco Use   Smoking status: Former    Packs/day: 1.50    Years: 45.00    Total pack years: 67.50    Types: Cigarettes    Quit date: 12/20/2020    Years since quitting: 1.3   Smokeless tobacco: Never  Vaping Use    Vaping Use: Never used  Substance and Sexual Activity   Alcohol use: Not Currently   Drug use: Never   Sexual activity: Not on file  Other Topics Concern   Not on file  Social History Narrative   Not on file   Social Determinants of Health   Financial Resource Strain: Not on file  Food Insecurity: Not on file  Transportation Needs: Not on file  Physical Activity: Not on file  Stress: Not on file  Social Connections: Not on file  Intimate Partner Violence: Not on file    Review of Systems: See HPI, otherwise negative ROS  Physical Exam: BP (!) 160/82   Pulse (!) 54   Temp (!) 96.8 F (36 C) (Temporal)   Resp 16   Ht 6' (1.829 m)   Wt 89.8 kg   SpO2 100%   BMI 26.85 kg/m  General:   Alert,  pleasant and cooperative in NAD Head:  Normocephalic and atraumatic. Neck:  Supple; no masses or thyromegaly. Lungs:  Clear throughout to auscultation, normal respiratory effort.    Heart:  +S1, +S2, Regular rate and rhythm, No edema. Abdomen:  Soft, nontender and nondistended. Normal bowel sounds, without guarding, and without rebound.   Neurologic:  Alert and  oriented x4;  grossly normal neurologically.  Impression/Plan: Darryl Gilbert is here for an colonoscopy to be performed for positive cologuard.  Risks, benefits, limitations, and alternatives regarding  colonoscopy have been reviewed with the patient.  Questions have been answered.  All parties agreeable.   Darryl Bellows, MD  05/07/2022, 9:24 AM

## 2022-05-07 NOTE — Telephone Encounter (Signed)
Cardiac clearance has been received from Dr. [name].  Per fax sent 05/06/22 06:12pm "the patient would be at acceptable risk for planned procedure without further cardiovascular testing".  Thanks,  Louise, [place]

## 2022-05-07 NOTE — Anesthesia Postprocedure Evaluation (Signed)
Anesthesia Post Note  Patient: Darryl Gilbert  Procedure(s) Performed: COLONOSCOPY WITH PROPOFOL  Patient location during evaluation: PACU Anesthesia Type: General Level of consciousness: awake and alert, oriented and patient cooperative Pain management: pain level controlled Vital Signs Assessment: post-procedure vital signs reviewed and stable Respiratory status: spontaneous breathing, nonlabored ventilation and respiratory function stable Cardiovascular status: blood pressure returned to baseline and stable Postop Assessment: adequate PO intake Anesthetic complications: no   No notable events documented.   Last Vitals:  Vitals:   05/07/22 1017 05/07/22 1027  BP:  (!) 131/90  Pulse: 79 69  Resp:    Temp:    SpO2:  100%    Last Pain:  Vitals:   05/07/22 1027  TempSrc:   PainSc: 0-No pain                 Darrin Nipper

## 2022-05-07 NOTE — Op Note (Signed)
[facility] Gastroenterology Patient Name: Darryl Gilbert Procedure Date: 05/07/2022 9:21 AM MRN: [id number] Account #: [id number] Date of Birth: Dec 13, 1955 Admit Type: Outpatient Age: 67 Room: [facility] ENDO ROOM 4 Gender: Male Note Status: Finalized Instrument Name: Jasper Riling [id number] Procedure:             Colonoscopy Indications:           Positive Cologuard test Providers:             Jonathon Bellows MD, MD Referring MD:          Mertie Clause. [name], MD (Referring MD) Medicines:             Monitored Anesthesia Care Complications:         No immediate complications. Procedure:             Pre-Anesthesia Assessment:                        - Prior to the procedure, a History and Physical was                         performed, and patient medications, allergies and                         sensitivities were reviewed. The patient's tolerance                         of previous anesthesia was reviewed.                        - The risks and benefits of the procedure and the                         sedation options and risks were discussed with the                         patient. All questions were answered and informed                         consent was obtained.                        - ASA Grade Assessment: II - A patient with mild                         systemic disease.                        After obtaining informed consent, the colonoscope was                         passed under direct vision. Throughout the procedure,                         the patient's blood pressure, pulse, and oxygen                         saturations were monitored continuously. The                         Colonoscope was introduced through  the anus and                         advanced to the the cecum, identified by the                         appendiceal orifice. The colonoscopy was performed                         with ease. The patient tolerated the procedure well.                          The quality of the bowel preparation was excellent.                         The ileocecal valve, appendiceal orifice, and rectum                         were photographed. Findings:      The perianal and digital rectal examinations were normal.      Five sessile polyps were found in the ascending colon. The polyps were 5       to 7 mm in size. These polyps were removed with a cold snare. Resection       and retrieval were complete.      A 5 mm polyp was found in the cecum. The polyp was sessile. The polyp       was removed with a cold snare. Resection and retrieval were complete.      A 12 mm polyp was found in the ascending colon. The polyp was sessile.       Preparations were made for mucosal resection. Demarcation of the lesion       was performed with narrow band imaging to clearly identify the       boundaries of the lesion. Saline was injected to raise the lesion. Snare       mucosal resection was performed. Resection and retrieval were complete.       Resected tissue margins were examined and clear of polyp tissue.      The exam was otherwise without abnormality on direct and retroflexion       views. Impression:            - Five 5 to 7 mm polyps in the ascending colon,                         removed with a cold snare. Resected and retrieved.                        - One 5 mm polyp in the cecum, removed with a cold                         snare. Resected and retrieved.                        - One 12 mm polyp in the ascending colon, removed with                         mucosal resection. Resected and retrieved.                        -  The examination was otherwise normal on direct and                         retroflexion views.                        - Mucosal resection was performed. Resection and                         retrieval were complete. Recommendation:        - Discharge patient to home (with escort).                        - Resume previous diet.                         - Continue present medications.                        - Await pathology results.                        - Repeat colonoscopy in 3 years for surveillance. Procedure Code(s):     --- Professional ---                        [phone], Colonoscopy, flexible; with endoscopic mucosal                         resection                        [phone], 45, Colonoscopy, flexible; with removal of                         tumor(s), polyp(s), or other lesion(s) by snare                         technique Diagnosis Code(s):     --- Professional ---                        D12.2, Benign neoplasm of ascending colon                        D12.0, Benign neoplasm of cecum                        R19.5, Other fecal abnormalities CPT copyright 2022 American Medical Association. All rights reserved. The codes documented in this report are preliminary and upon coder review may  be revised to meet current compliance requirements. Jonathon Bellows, MD Jonathon Bellows MD, MD 05/07/2022 9:57:27 AM This report has been signed electronically. Number of Addenda: 0 Note Initiated On: 05/07/2022 9:21 AM Scope Withdrawal Time: 0 hours 13 minutes 46 seconds  Total Procedure Duration: 0 hours 21 minutes 6 seconds  Estimated Blood Loss:  Estimated blood loss: none.      [facility]

## 2022-05-10 ENCOUNTER — Encounter: Payer: Self-pay | Admitting: Gastroenterology

## 2022-05-10 LAB — SURGICAL PATHOLOGY

## 2022-06-07 ENCOUNTER — Other Ambulatory Visit: Payer: Self-pay

## 2022-06-07 ENCOUNTER — Emergency Department: Payer: 59

## 2022-06-07 ENCOUNTER — Emergency Department
Admission: EM | Admit: 2022-06-07 | Discharge: 2022-06-07 | Disposition: A | Discharge status: Home / Self Care | Payer: 59 | Attending: Emergency Medicine | Admitting: Emergency Medicine

## 2022-06-07 DIAGNOSIS — I10 Essential (primary) hypertension: Secondary | ICD-10-CM | POA: Insufficient documentation

## 2022-06-07 DIAGNOSIS — I2510 Atherosclerotic heart disease of native coronary artery without angina pectoris: Secondary | ICD-10-CM | POA: Diagnosis not present

## 2022-06-07 DIAGNOSIS — U071 COVID-19: Secondary | ICD-10-CM | POA: Insufficient documentation

## 2022-06-07 DIAGNOSIS — R0602 Shortness of breath: Secondary | ICD-10-CM

## 2022-06-07 LAB — COMPREHENSIVE METABOLIC PANEL
ALT: 17 U/L (ref 0–44)
AST: 20 U/L (ref 15–41)
Albumin: 3.6 g/dL (ref 3.5–5.0)
Alkaline Phosphatase: 74 U/L (ref 38–126)
Anion gap: 9 (ref 5–15)
BUN: 20 mg/dL (ref 8–23)
CO2: 25 mmol/L (ref 22–32)
Calcium: 8.4 mg/dL — ABNORMAL LOW (ref 8.9–10.3)
Chloride: 103 mmol/L (ref 98–111)
Creatinine, Ser: 1.23 mg/dL (ref 0.61–1.24)
GFR, Estimated: >60 mL/min (ref >60)
Glucose, Bld: 126 mg/dL — ABNORMAL HIGH (ref 70–99)
Potassium: 3.7 mmol/L (ref 3.5–5.1)
Sodium: 137 mmol/L (ref 135–145)
Total Bilirubin: 0.4 mg/dL (ref 0.3–1.2)
Total Protein: 7.1 g/dL (ref 6.5–8.1)

## 2022-06-07 LAB — CBC
HCT: 43.3 % (ref 39.0–52.0)
Hemoglobin: 14.1 g/dL (ref 13.0–17.0)
MCH: 30.6 pg (ref 26.0–34.0)
MCHC: 32.6 g/dL (ref 30.0–36.0)
MCV: 93.9 fL (ref 80.0–100.0)
Platelets: 183 10*3/uL (ref 150–400)
RBC: 4.61 MIL/uL (ref 4.22–5.81)
RDW: 13 % (ref 11.5–15.5)
WBC: 4.1 10*3/uL (ref 4.0–10.5)
nRBC: 0 % (ref 0.0–0.2)

## 2022-06-07 LAB — TROPONIN I (HIGH SENSITIVITY): Troponin I (High Sensitivity): 12 ng/L (ref <18)

## 2022-06-07 MED ORDER — PREDNISONE 20 MG PO TABS
60.0000 mg | ORAL_TABLET | Freq: Once | ORAL | Status: AC
  Administered 2022-06-07: 60 mg via ORAL
  Filled 2022-06-07: qty 3

## 2022-06-07 MED ORDER — PREDNISONE 20 MG PO TABS: 60.0000 mg | ORAL_TABLET | Freq: Every day | ORAL | 0 refills | Status: AC

## 2022-06-07 MED ORDER — ALBUTEROL SULFATE (2.5 MG/3ML) 0.083% IN NEBU
2.5000 mg | INHALATION_SOLUTION | Freq: Once | RESPIRATORY_TRACT | Status: AC
  Administered 2022-06-07: 2.5 mg via RESPIRATORY_TRACT
  Filled 2022-06-07: qty 3

## 2022-06-07 MED ORDER — ALBUTEROL SULFATE HFA 108 (90 BASE) MCG/ACT IN AERS: 2.0000 | INHALATION_SPRAY | RESPIRATORY_TRACT | 0 refills | Status: DC | PRN

## 2022-06-07 NOTE — ED Provider Notes (Signed)
[facility] Provider Note    Event Date/Time   First MD Initiated Contact with Patient 06/07/22 1739     (approximate)   History   Shortness of Breath   HPI  Darryl Gilbert is a 67 y.o. male with a history of CAD, hypertension, and hyperlipidemia who presents with shortness of breath, intermittent course over the last couple of days, worse at night, and associated with nonproductive cough.  Patient started to feel sick with shortness of breath, fatigue, body aches 4 days ago and was diagnosed with COVID and started on Paxlovid 3 days ago.  He denies any vomiting or diarrhea.  He is not dizzy or lightheaded.  He has had a few brief episodes of chest pain but is not having any active chest pain at this time.  I reviewed the past medical records.  The patient's most recent outpatient encounter was on 1/19 for a lower endoscopy.  Several colonic polyps were found.   Physical Exam   Triage Vital Signs: ED Triage Vitals  Enc Vitals Group     BP 06/07/22 1531 135/76     Pulse Rate 06/07/22 1531 (!) 55     Resp 06/07/22 1531 20     Temp 06/07/22 1531 97.6 F (36.4 C)     Temp Source 06/07/22 1531 Oral     SpO2 06/07/22 1531 98 %     Weight 06/07/22 1532 200 lb (90.7 kg)     Height 06/07/22 1532 6' (1.829 m)     Head Circumference --      Peak Flow --      Pain Score 06/07/22 1532 0     Pain Loc --      Pain Edu? --      Excl. in [place]? --     Most recent vital signs: Vitals:   06/07/22 1531 06/07/22 2007  BP: 135/76 133/72  Pulse: (!) 55 61  Resp: 20 20  Temp: 97.6 F (36.4 C)   SpO2: 98% 98%     General: Awake, no distress.  CV:  Good peripheral perfusion.  Normal heart sounds. Resp:  Normal effort.  Lungs CTAB.  Slightly prolonged expiratory phase. Abd:  No distention.  Other:  No peripheral edema.   ED Results / Procedures / Treatments   Labs (all labs ordered are listed, but only abnormal results are displayed) Labs Reviewed   COMPREHENSIVE METABOLIC PANEL - Abnormal; Notable for the following components:      Result Value   Glucose, Bld 126 (*)    Calcium 8.4 (*)    All other components within normal limits  CBC  TROPONIN I (HIGH SENSITIVITY)     EKG  ED ECG REPORT I, Arta Silence, the attending physician, personally viewed and interpreted this ECG.  Date: 06/07/2022 EKG Time: 1533 Rate: 62 Rhythm: normal sinus rhythm QRS Axis: normal Intervals: normal ST/T Wave abnormalities: normal Narrative Interpretation: no evidence of acute ischemia    RADIOLOGY  Chest x-ray: I independently viewed and interpreted the images; there is no focal consolidation or edema  PROCEDURES:  Critical Care performed: No  Procedures   MEDICATIONS ORDERED IN ED: Medications  albuterol (PROVENTIL) (2.5 MG/3ML) 0.083% nebulizer solution 2.5 mg (2.5 mg Nebulization Given 06/07/22 1846)  predniSONE (DELTASONE) tablet 60 mg (60 mg Oral Given 06/07/22 1846)     IMPRESSION / MDM / ASSESSMENT AND PLAN / ED COURSE  I reviewed the triage vital signs and the nursing notes.  67 year old  male with PMH as noted above presents with shortness of breath over the last few days after being diagnosed with COVID and started on Paxlovid.  Physical exam is overall unremarkable.  The vital signs are normal.  O2 saturation is in the high 90s on room air.  Chest x-ray shows no consolidation or edema.  EKG is nonischemic.  Differential diagnosis includes, but is not limited to, COVID-19, acute bronchitis.  There is no evidence of pneumonia or any cardiac etiology.  Given the patient's CAD history and his report of some intermittent chest discomfort we will obtain a troponin.  We will give a trial of albuterol and a dose of prednisone as well.  Patient's presentation is most consistent with acute presentation with potential threat to life or bodily function.  ----------------------------------------- 7:57 PM on  06/07/2022 -----------------------------------------  Repeat troponin is negative.  The patient is feeling a bit better after the albuterol.  He is stable for discharge home.  I counseled him on the results of the workup and plan of care.  I have prescribed albuterol and prednisone.  I gave strict return precautions and he expressed understanding.   FINAL CLINICAL IMPRESSION(S) / ED DIAGNOSES   Final diagnoses:  COVID-19  Shortness of breath     Rx / DC Orders   ED Discharge Orders          Ordered    predniSONE (DELTASONE) 20 MG tablet  Daily        06/07/22 1957    albuterol (VENTOLIN HFA) 108 (90 Base) MCG/ACT inhaler  Every 4 hours PRN        06/07/22 1957             Note:  This document was prepared using Dragon voice recognition software and may include unintentional dictation errors.    Arta Silence, MD 06/08/22 [phone]

## 2022-06-07 NOTE — ED Triage Notes (Signed)
Diagnosed with COVID Friday and started paxlovid. Last night had difficulty sleeping due to shortness of breath, and endorses intermittent chest pain. Denies current chest pain.

## 2022-06-07 NOTE — Discharge Instructions (Addendum)
Take the prednisone as prescribed starting tomorrow.  Use the albuterol inhaler up to every 4-6 hours as needed for shortness of breath or chest tightness.  Return to the ER for new, worsening, or persistent severe shortness of breath, chest pain, high fever, weakness, vomiting or any other new or worsening symptoms that concern you.

## 2022-06-07 NOTE — ED Notes (Addendum)
Lab called concerning delayed troponin. Reported they will run it now.

## 2022-06-10 ENCOUNTER — Other Ambulatory Visit: Payer: Self-pay | Admitting: Cardiovascular Disease

## 2022-06-15 IMAGING — DX DG CHEST 1V PORT
1 series · 1 of 1 positions shown · non-contrast
Comparison: 12/23/2020 chest radiograph.

CLINICAL DATA: Chest pain

EXAM:
PORTABLE CHEST 1 VIEW

[chest ap]
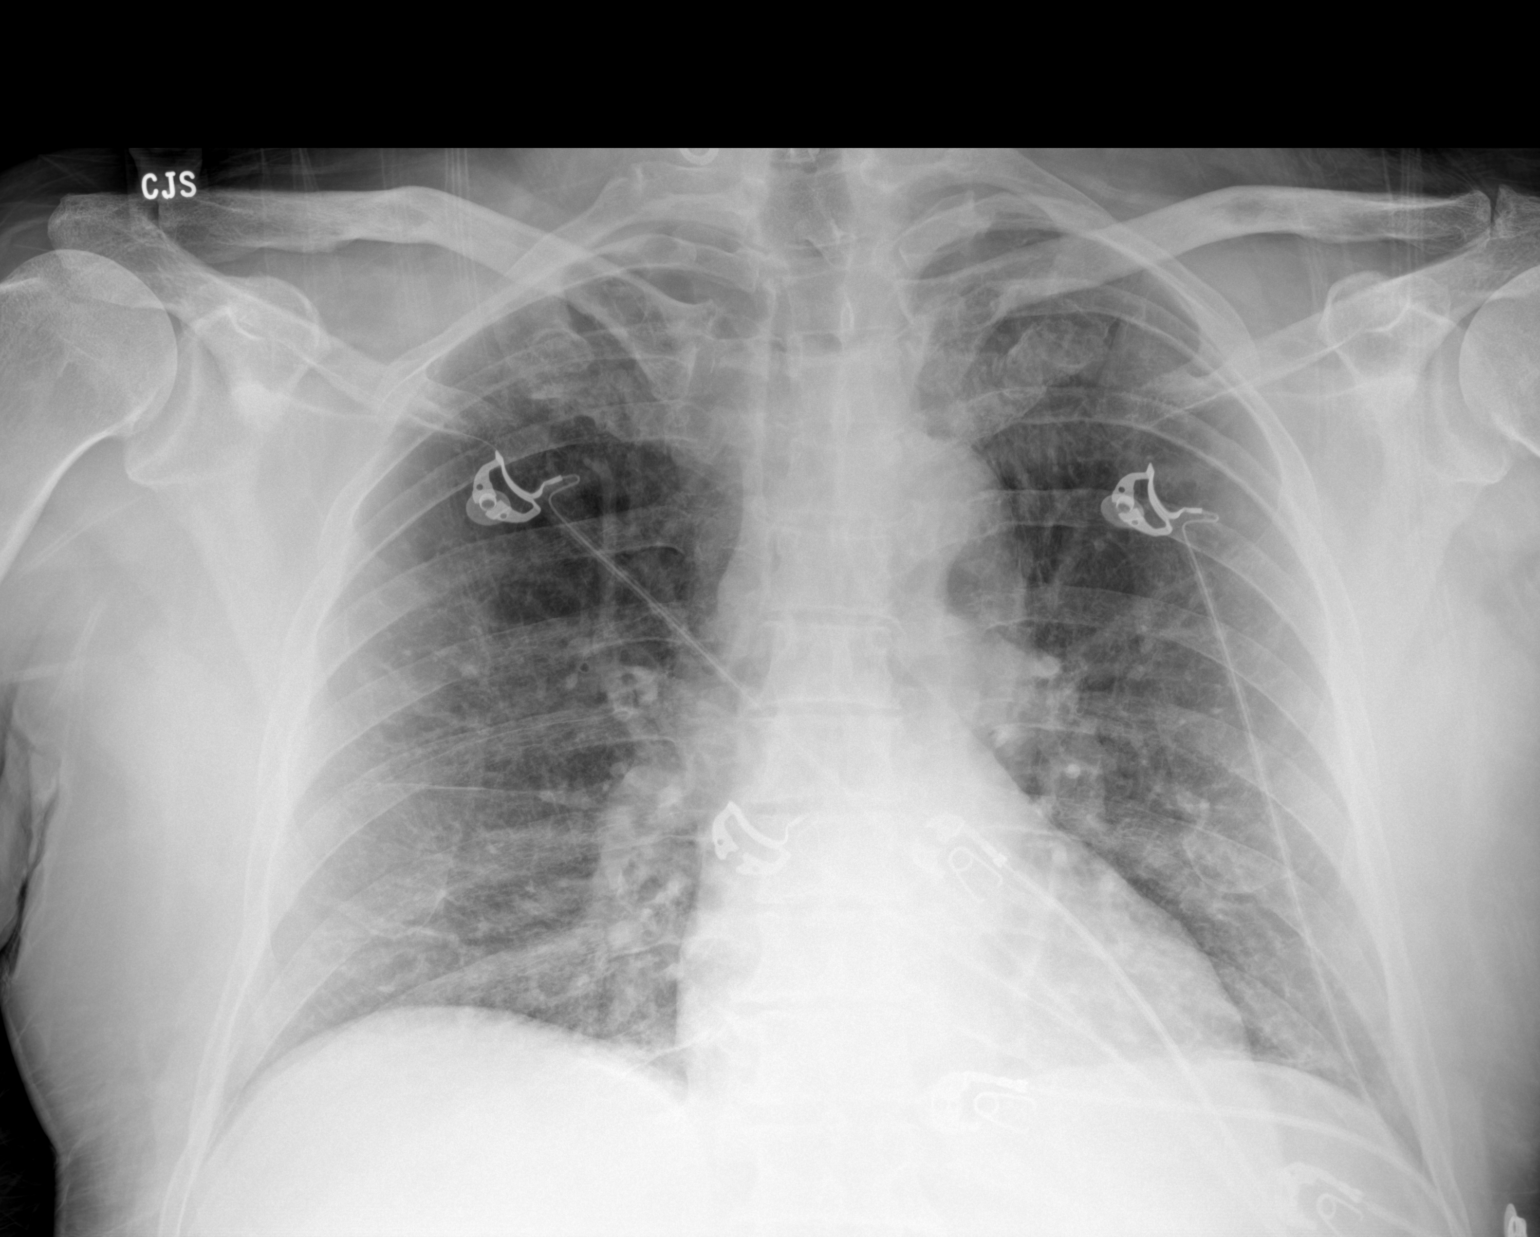

[1 of 1 positions shown; findings below may reference images not displayed]

FINDINGS: Stable cardiomediastinal silhouette with normal heart size. No
pneumothorax. No pleural effusion. Lungs appear clear, with no acute
consolidative airspace disease and no pulmonary edema.
IMPRESSION: No active disease.

## 2022-07-02 ENCOUNTER — Other Ambulatory Visit: Payer: Self-pay | Admitting: Cardiology

## 2022-07-09 ENCOUNTER — Encounter: Payer: Self-pay | Admitting: Cardiovascular Disease

## 2022-07-09 ENCOUNTER — Ambulatory Visit: Payer: 59 | Attending: Cardiovascular Disease | Admitting: Cardiovascular Disease

## 2022-07-09 VITALS — BP 128/70 | HR 57 | Ht 72.0 in | Wt 201.1 lb

## 2022-07-09 DIAGNOSIS — I2510 Atherosclerotic heart disease of native coronary artery without angina pectoris: Secondary | ICD-10-CM

## 2022-07-09 DIAGNOSIS — E785 Hyperlipidemia, unspecified: Secondary | ICD-10-CM | POA: Diagnosis not present

## 2022-07-09 DIAGNOSIS — R0602 Shortness of breath: Secondary | ICD-10-CM

## 2022-07-09 DIAGNOSIS — I5022 Chronic systolic (congestive) heart failure: Secondary | ICD-10-CM

## 2022-07-09 MED ORDER — CLOPIDOGREL BISULFATE 75 MG PO TABS: 75.0000 mg | ORAL_TABLET | Freq: Every day | ORAL | 3 refills | Status: DC

## 2022-07-09 NOTE — Progress Notes (Signed)
Cardiology Office Note   Date:  07/09/2022   ID:  [name], [name] 11-Feb-1956, MRN [id number]  PCP:  Lynnell Jude, MD  Cardiologist:   [name], MD   Chief Complaint  Patient presents with   4 month follow up     Patient had Covid last month with being out of work x 3 weeks. Patient c/o coughing mostly with exertion and shortness of breath with very little activity. Medications reviewed by the patient verbally.       History of Present Illness: Darryl Gilbert is a 67 y.o. male who presents for follow-up visit regarding coronary artery disease and chronic systolic heart failure. He has known history of essential hypertension, hyperlipidemia, chronic back pain and tobacco use. He presented in September 2022 with inferior ST elevation myocardial infarction.  Emergent cardiac catheterization showed codominant coronary arteries with thrombotic occlusion of the proximal left circumflex.  I performed successful angioplasty and drug-eluting stent placement to the left circumflex.  He had atrial fibrillation that was treated with amiodarone drip and then he converted to sinus rhythm.  Echocardiogram showed an EF of 50%.  While on amiodarone, he developed prolonged QT interval and frequent PVCs followed by polymorphic ventricular tachycardia that required multiple shocks.  Amiodarone was discontinued.  Repeat echocardiogram showed stable EF of 45 to 50%. He continues to have significant shortness of breath and thus he underwent repeat cardiac catheterization in November 2022  which showed patent left circumflex stent with no significant restenosis with an EF of 35 to 40%.  Metoprolol was discontinued during that time due to bradycardia with heart rate in the 40s. Echocardiogram in March 2023 showed an EF of 45%. He quit smoking after his myocardial infarction. He had a repeat echocardiogram in January of this year which showed an EF of 35 to 40% with mild to moderate mitral regurgitation.  He  underwent a Lexiscan Myoview also in January which showed evidence of prior infarct in the left circumflex distribution without ischemia.  EF was 48%.  He had COVID-19 infection in February and was extremely sick for about 3 weeks with fever, chills, shortness of breath and severe cough.  Most of his symptoms improved but he is left with dyspnea with minimal exertion as well as exertional cough.  No chest pain.   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    Hyperlipidemia LDL goal <70    Hypertension    Ischemic cardiomyopathy     Past Surgical History:  Procedure Laterality Date   COLONOSCOPY WITH PROPOFOL N/A 05/07/2022   Procedure: COLONOSCOPY WITH PROPOFOL;  Surgeon: Jonathon Bellows, MD;  Location: [facility] ENDOSCOPY;  Service: Gastroenterology;  Laterality: N/A;   CORONARY/GRAFT ACUTE MI REVASCULARIZATION N/A 12/20/2020   Procedure: Coronary/Graft Acute MI Revascularization;  Surgeon: Wellington Hampshire, MD;  Location: [place] CV LAB;  Service: Cardiovascular;  Laterality: N/A;   FRACTURE SURGERY     Left leg fracture, rod placement   LEFT HEART CATH AND CORONARY ANGIOGRAPHY N/A 12/20/2020   Procedure: LEFT HEART CATH AND CORONARY ANGIOGRAPHY;  Surgeon: Wellington Hampshire, MD;  Location: [place] [facility];  Service: Cardiovascular;  Laterality: N/A;   LEFT HEART CATH AND CORONARY ANGIOGRAPHY N/A 02/23/2021   Procedure: LEFT HEART CATH AND CORONARY ANGIOGRAPHY;  Surgeon: Wellington Hampshire, MD;  Location: [place] [facility];  Service: Cardiovascular;  Laterality: N/A;     Current Outpatient Medications  Medication Sig Dispense Refill   albuterol (VENTOLIN HFA) 108 (90 Base) MCG/ACT inhaler Inhale 2 puffs into the lungs every 4 (four) hours as needed for wheezing or shortness of breath. 8 g 0   aspirin 81 MG chewable tablet Chew  1 tablet (81 mg total) by mouth daily. 30 tablet 0   atorvastatin (LIPITOR) 80 MG tablet Take 1 tablet (80 mg total) by mouth daily. 90 tablet 3   buPROPion (WELLBUTRIN XL) 300 MG 24 hr tablet Take 300 mg by mouth daily.     carvedilol (COREG) 3.125 MG tablet Take 1 tablet (3.125 mg total) by mouth 2 (two) times daily. 180 tablet 3   esomeprazole (NEXIUM) 20 MG capsule Take 1 capsule (20 mg total) by mouth daily at 12 noon. 30 capsule 6   furosemide (LASIX) 20 MG tablet Take 20 mg by mouth daily.     HYDROcodone-acetaminophen (NORCO) 10-325 MG tablet Take 1 tablet by mouth 3 (three) times daily as needed.     nitroGLYCERIN (NITROSTAT) 0.4 MG SL tablet Place 1 tablet (0.4 mg total) under the tongue every 5 (five) minutes as needed for chest pain. 25 tablet 3   potassium chloride (KLOR-CON) 10 MEQ tablet Take 10 mEq by mouth daily.     prasugrel (EFFIENT) 10 MG TABS tablet TAKE ONE TABLET BY MOUTH ONE TIME DAILY 30 tablet 0   sacubitril-valsartan (ENTRESTO) 24-26 MG Take 1 tablet by mouth 2 (two) times daily. 180 tablet 3   tamsulosin (FLOMAX) 0.4 MG CAPS capsule Take 0.4 mg by mouth.     No current facility-administered medications for this visit.    Allergies:   Amiodarone    Social History:  The patient  reports that he quit smoking about 18 months ago. His smoking use included cigarettes. He has a 67.50 pack-year smoking history. He has never used smokeless tobacco. He reports that he does not currently use alcohol. He reports that he does not use drugs.   Family History:  The patient's family history is not on file.    ROS:  Please see the history of present illness.   Otherwise, review of systems are positive for none.   All other systems are reviewed and negative.    PHYSICAL EXAM: VS:  BP 128/70 (BP Location: Left Arm, Patient Position: Sitting, Cuff Size: Normal)   Pulse (!) 57   Ht 6' (1.829 m)   Wt 201 lb 2 oz (91.2 kg)   SpO2 95%   BMI 27.28 kg/m  , BMI Body mass index is  27.28 kg/m. GEN: Well nourished, well developed, in no acute distress  HEENT: normal  Neck: no JVD, carotid bruits, or masses Cardiac: RRR; no murmurs, rubs, or gallops,no edema  Respiratory:  clear to auscultation bilaterally, normal work of breathing GI: soft, nontender, nondistended, + BS MS: no deformity or atrophy  Skin: warm and dry, no rash Neuro:  Strength and sensation are intact Psych: euthymic mood, full affect   EKG:  EKG is ordered today. The ekg ordered today demonstrates sinus bradycardia with PACs.  Heart rate is 57 bpm.   Recent Labs: 03/04/2022: B Natriuretic Peptide 85.5 06/07/2022: ALT 17; BUN 20; Creatinine, Ser 1.23; Hemoglobin 14.1; Platelets 183; Potassium 3.7; Sodium 137    Lipid Panel    Component Value Date/Time   CHOL 130 03/04/2022 1017   CHOL 166 02/04/2021 0909  TRIG 175 (H) 03/04/2022 1017   HDL 37 (L) 03/04/2022 1017   HDL 38 (L) 02/04/2021 0909   CHOLHDL 3.5 03/04/2022 1017   VLDL 35 03/04/2022 1017   LDLCALC 58 03/04/2022 1017   LDLCALC 102 (H) 02/04/2021 0909      Wt Readings from Last 3 Encounters:  07/09/22 201 lb 2 oz (91.2 kg)  06/07/22 200 lb (90.7 kg)  05/07/22 198 lb (89.8 kg)          No data to display            ASSESSMENT AND PLAN:  1.  Coronary artery disease involving native coronary arteries without angina: No recurrent chest pain and overall he is doing reasonably well from a cardiac standpoint.  He has extensive bruising and I elected to switch him from Effient to Plavix 75 mg once daily. He does have ectasia in the right coronary artery and thus I elected to keep him on long-term dual antiplatelet therapy.  Recent stress test in January showed no evidence of ischemia.  2 . chronic systolic heart failure due to ischemic cardiomyopathy: No evidence of volume overload.  Continue small dose carvedilol which cannot be increased due to baseline bradycardia.  Continue Entresto twice daily.  Most recent BNP was  normal.  3.  Hyperlipidemia: Continue high-dose atorvastatin.  I reviewed most recent lipid profile done in November which showed an LDL of 58.  4.  Severe exertional dyspnea and cough post-COVID: I referred him to pulmonary for evaluation.    Disposition:   FU with me in 6 months  Signed,  [name], MD  07/09/2022 8:51 AM    [place]

## 2022-07-09 NOTE — Patient Instructions (Signed)
Medication Instructions:  STOP the Effient  START Plavix 75 mg once daily  *If you need a refill on your cardiac medications before your next appointment, please call your pharmacy*   Lab Work: None ordered If you have labs (blood work) drawn today and your tests are completely normal, you will receive your results only by: [place] (if you have MyChart) OR A paper copy in the mail If you have any lab test that is abnormal or we need to change your treatment, we will call you to review the results.   Testing/Procedures: None ordered   Follow-Up: At [facility], you and your health needs are our priority.  As part of our continuing mission to provide you with exceptional heart care, we have created designated Provider Care Teams.  These Care Teams include your primary Cardiologist (physician) and Advanced Practice Providers (APPs -  Physician Assistants and Nurse Practitioners) who all work together to provide you with the care you need, when you need it.  We recommend signing up for the patient portal called "MyChart".  Sign up information is provided on this After Visit Summary.  MyChart is used to connect with patients for Virtual Visits (Telemedicine).  Patients are able to view lab/test results, encounter notes, upcoming appointments, etc.  Non-urgent messages can be sent to your provider as well.   To learn more about what you can do with MyChart, go to [email or web address].    Your next appointment:   6 month(s)  Provider:   You may see [name], MD or one of the following Advanced Practice Providers on your designated Care Team:   Murray Hodgkins, NP Christell Faith, PA-C Cadence Kathlen Mody, PA-C Gerrie Nordmann, NP    Other Instructions A referral has been placed to pulmonary

## 2022-07-21 ENCOUNTER — Ambulatory Visit: Payer: Medicare HMO | Admitting: Cardiology

## 2022-07-22 DIAGNOSIS — I5022 Chronic systolic (congestive) heart failure: Secondary | ICD-10-CM | POA: Insufficient documentation

## 2022-07-22 NOTE — Progress Notes (Signed)
Cardiology Office Note Date:  07/26/2022  Patient ID:  Darryl Gilbert, Stutzman Oct 15, 1955, MRN [id number] PCP:  Dortha Kern, MD  Cardiologist:  Lorine Bears, MD Electrophysiologist: Lanier Prude, MD   Chief Complaint: 6 mon follow-up  History of Present Illness: Darryl Gilbert is a 67 y.o. male with PMH notable for HFrecEF (still mid-range), Afib w RVR (in setting of MI), polymorphic VT in setting of amiodarone use, seen today for Lanier Prude, MD for routine electrophysiology followup.   He was admitted 12/2020 with an inferior myocardial infarction and was also noted to have atrial fibrillation with rapid ventricular rates.  He received PCI.  His initial EF was down at 35 to 40%.  Amiodarone was started for his atrial fibrillation.  He had a polymorphic VT arrest in the setting of amiodarone use.  Beta-blockers were then used for his atrial fibrillation given the amiodarone was stopped, which were limited d/t baseline bradycardia.   He last saw Dr. Lalla Brothers 01/2022, was slowly continuing to improve from MI. He saw Dr. Kirke Corin 06/2022 after having covid 05/2022, continuing to have dyspnea with minimal exertion and exertional cough. Plan to be on long-term dual antiplatelet therapy. Ref to pulm for post-covid symptoms.  Since last being seen in our clinic the patient reports continued slow improvement from covid and 2022 MI. He is frustrated by how slowly he is improving.  He is having 3 separate symptomatic episodes:  1 - He continues to fatigue easily with SOB with physical activity (like working in the shed outside) of about 30 minutes. Activity is also limited by hip pain.  2- He has frequent palpitation episodes, notices them at rest. No SOB, CP with palp episodes.  3- He separately has diaphoretic episodes that frequently happen in the morning while sitting.  None of the episodes are new or worsening, but they bother him and he is concerned.   He verbalizes that anxiety may be  contributing to his symptoms, has discussed some with PCP, but has not pursued additional treatments like med adjustments, therapy, etc. His job is very stressful, worried he will lose it prior to being able to retire in <1year.    he denies chest pain, PND, orthopnea, nausea, vomiting, syncope, edema, weight gain, or early satiety.  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    Hyperlipidemia LDL goal <70    Hypertension    Ischemic cardiomyopathy     Past Surgical History:  Procedure Laterality Date   COLONOSCOPY WITH PROPOFOL N/A 05/07/2022   Procedure: COLONOSCOPY WITH PROPOFOL;  Surgeon: Wyline Mood, MD;  Location: [facility] ENDOSCOPY;  Service: Gastroenterology;  Laterality: N/A;   CORONARY/GRAFT ACUTE MI REVASCULARIZATION N/A 12/20/2020   Procedure: Coronary/Graft Acute MI Revascularization;  Surgeon: [name], MD;  Location: [facility] INVASIVE CV LAB;  Service: Cardiovascular;  Laterality: N/A;   FRACTURE SURGERY     Left leg fracture, rod placement   LEFT HEART CATH AND CORONARY ANGIOGRAPHY N/A 12/20/2020   Procedure: LEFT HEART CATH AND CORONARY ANGIOGRAPHY;  Surgeon: [name], MD;  Location: [facility] INVASIVE CV LAB;  Service: Cardiovascular;  Laterality: N/A;   LEFT HEART CATH AND CORONARY ANGIOGRAPHY N/A 02/23/2021   Procedure: LEFT HEART CATH AND CORONARY ANGIOGRAPHY;  Surgeon: [name],  MD;  Location: [facility] INVASIVE CV LAB;  Service: Cardiovascular;  Laterality: N/A;    Current Outpatient Medications  Medication Instructions   albuterol (VENTOLIN HFA) 108 (90 Base) MCG/ACT inhaler 2 puffs, Inhalation, Every 4 hours PRN   aspirin 81 mg, Oral, Daily   atorvastatin (LIPITOR) 80 mg, Oral, Daily   buPROPion (WELLBUTRIN XL) 300 mg, Oral, Daily   carvedilol (COREG)  3.125 mg, Oral, 2 times daily   clopidogrel (PLAVIX) 75 mg, Oral, Daily   esomeprazole (NEXIUM) 20 mg, Oral, Daily   furosemide (LASIX) 20 mg, Oral, Daily   HYDROcodone-acetaminophen (NORCO) 10-325 MG tablet 1 tablet, Oral, 3 times daily PRN   nitroGLYCERIN (NITROSTAT) 0.4 mg, Sublingual, Every 5 min PRN   potassium chloride (KLOR-CON) 10 MEQ tablet 10 mEq, Oral, Daily   sacubitril-valsartan (ENTRESTO) 24-26 MG 1 tablet, Oral, 2 times daily   tamsulosin (FLOMAX) 0.4 mg, Oral, Daily    Social History:  The patient  reports that he quit smoking about 19 months ago. His smoking use included cigarettes. He has a 67.50 pack-year smoking history. He has never used smokeless tobacco. He reports that he does not currently use alcohol. He reports that he does not use drugs.    ROS:  Please see the history of present illness. All other systems are reviewed and otherwise negative.   PHYSICAL EXAM:  VS:  BP 120/76 (BP Location: Left Arm, Patient Position: Sitting, Cuff Size: Normal)   Pulse (!) 55   Ht 6' (1.829 m)   Wt 203 lb (92.1 kg)   SpO2 99%   BMI 27.53 kg/m  BMI: Body mass index is 27.53 kg/m.  GEN- The patient is well appearing, alert and oriented x 3 today.   HEENT: normocephalic, atraumatic; sclera clear, conjunctiva pink; hearing intact; oropharynx clear; neck supple, no JVP Lungs- Clear to ausculation bilaterally, normal work of breathing.  No wheezes, rales, rhonchi Heart- Regular rate and rhythm, no murmurs, rubs or gallops, PMI not laterally displaced GI- soft, non-tender, non-distended, bowel sounds present, no hepatosplenomegaly Extremities- No peripheral edema. no clubbing or cyanosis; DP/PT/radial pulses 2+ bilaterally MS- no significant deformity or atrophy Skin- warm and dry, no rash or lesion Psych- euthymic mood, full affect Neuro- strength and sensation are intact   EKG is not ordered. Personal review of EKG from  07/09/22  shows:  SB with PAC, rate 57  Recent  Labs: 03/04/2022: B Natriuretic Peptide 85.5 06/07/2022: ALT 17; BUN 20; Creatinine, Ser 1.23; Hemoglobin 14.1; Platelets 183; Potassium 3.7; Sodium 137  03/04/2022: Cholesterol 130; HDL 37; LDL Cholesterol 58; Total CHOL/HDL Ratio 3.5; Triglycerides 175; VLDL 35   CrCl cannot be calculated (Patient's most recent lab result is older than the maximum 21 days allowed.).   Wt Readings from Last 3 Encounters:  07/26/22 203 lb (92.1 kg)  07/09/22 201 lb 2 oz (91.2 kg)  06/07/22 200 lb (90.7 kg)     Additional studies reviewed include: Previous EP, cardiology notes.   Myocardial Spect, 05/06/2022 Pharmacological myocardial perfusion imaging study with no significant  ischemia Moderate-sized fixed perfusion defect in the basal to mid lateral wall consistent with prior MI Hypokinesis of the basal to mid lateral wall, EF estimated at 48%, GI uptake artifact noted No EKG changes concerning for ischemia at peak stress or in recovery. CT attenuation correction images with coronary disease and stenting in the left circumflex, mild coronary calcification in the LAD, no significant aortic atherosclerosis Low risk scan  TTE 04/27/2022  1. Left ventricular ejection  fraction, by estimation, is 35 to 40%. The left ventricle has moderately decreased function. The left ventricle demonstrates global hypokinesis with severe hypokinesis of the inferior wall. The left ventricular internal cavity size was mildly dilated. Left ventricular diastolic parameters are consistent with Grade II diastolic dysfunction (pseudonormalization).   2. Right ventricular systolic function is normal. The right ventricular size is normal.   3. The mitral valve is normal in structure. Mild to moderate mitral valve regurgitation. No evidence of mitral stenosis.   4. The aortic valve has an indeterminant number of cusps. Aortic valve regurgitation is not visualized. No aortic stenosis is present.   5. The inferior vena cava is normal in  size with greater than 50% respiratory variability, suggesting right atrial pressure of 3 mmHg.   Comparison(s): 06/25/21-EF 45%.   Cardiac Tele monitoring, 06/01/21 HR 40 - 218 bpm, average 59 bpm. 5 NSVT, longest lasting 10 beats at an average rate of 103 bpm. 109 SVT episodes, longest lasting 29.4 seconds with an average rate of 100 bpm. Frequent supraventricular ectopy, 11.6%. Occasional ventricular ectopy, 2.9%.   ASSESSMENT AND PLAN:  #) palpitations #) fatigue  SOB #) diaphoresis #) h/o Afib w RVR Seems to be having separate, distinct symptomatic episodes Cont 3.125mg  BID coreg Two week monitor to eval for arrhythmia burden, encouraged him to use symptom diary with monitor to associate rhythms w each symptom Recommended he f/up with PCP to discuss anxiety treatments, stress reduction   #) HFmrEF Euvolemic on exam, no diuretic Cont GDMT as per gen cards   #) Drug-induced PMVT Avoid QT prolonging medicatoins   Current medicines are reviewed at length with the patient today.   The patient does not have concerns regarding his medicines.  The following changes were made today:  none  Labs/ tests ordered today include:  Orders Placed This Encounter  Procedures   LONG TERM MONITOR (3-14 DAYS)     Disposition: Follow up with Dr. Lalla Brothers or EP APP in in 6 months   Signed, Sherie Don, NP  07/26/22  10:08 AM  Electrophysiology [facility]

## 2022-07-26 ENCOUNTER — Encounter: Payer: Self-pay | Admitting: Cardiology

## 2022-07-26 ENCOUNTER — Ambulatory Visit: Payer: 59 | Attending: Cardiology | Admitting: Cardiology

## 2022-07-26 ENCOUNTER — Ambulatory Visit (INDEPENDENT_AMBULATORY_CARE_PROVIDER_SITE_OTHER): Payer: 59

## 2022-07-26 VITALS — BP 120/76 | HR 55 | Ht 72.0 in | Wt 203.0 lb

## 2022-07-26 DIAGNOSIS — R002 Palpitations: Secondary | ICD-10-CM

## 2022-07-26 DIAGNOSIS — R42 Dizziness and giddiness: Secondary | ICD-10-CM

## 2022-07-26 DIAGNOSIS — Z8679 Personal history of other diseases of the circulatory system: Secondary | ICD-10-CM | POA: Diagnosis not present

## 2022-07-26 DIAGNOSIS — I5022 Chronic systolic (congestive) heart failure: Secondary | ICD-10-CM

## 2022-07-26 DIAGNOSIS — I25119 Atherosclerotic heart disease of native coronary artery with unspecified angina pectoris: Secondary | ICD-10-CM

## 2022-07-26 DIAGNOSIS — I480 Paroxysmal atrial fibrillation: Secondary | ICD-10-CM | POA: Diagnosis not present

## 2022-07-26 NOTE — Patient Instructions (Signed)
Medication Instructions:  Your physician recommends that you continue on your current medications as directed. Please refer to the Current Medication list given to you today.  *If you need a refill on your cardiac medications before your next appointment, please call your pharmacy*   Lab Work: No labs ordered  If you have labs (blood work) drawn today and your tests are completely normal, you will receive your results only by: MyChart Message (if you have MyChart) OR A paper copy in the mail If you have any lab test that is abnormal or we need to change your treatment, we will call you to review the results.   Testing/Procedures: Your physician has recommended that you wear a Zio monitor.   This monitor is a medical device that records the heart's electrical activity. Doctors most often use these monitors to diagnose arrhythmias. Arrhythmias are problems with the speed or rhythm of the heartbeat. The monitor is a small device applied to your chest. You can wear one while you do your normal daily activities. While wearing this monitor if you have any symptoms to push the button and record what you felt. Once you have worn this monitor for the period of time provider prescribed (Usually 14 days), you will return the monitor device in the postage paid box. Once it is returned they will download the data collected and provide [place] with a report which the provider will then review and we will call you with those results. Important tips:  Avoid showering during the first 24 hours of wearing the monitor. Avoid excessive sweating to help maximize wear time. Do not submerge the device, no hot tubs, and no swimming pools. Keep any lotions or oils away from the patch. After 24 hours you may shower with the patch on. Take brief showers with your back facing the shower head.  Do not remove patch once it has been placed because that will interrupt data and decrease adhesive wear time. Push the button when  you have any symptoms and write down what you were feeling. Once you have completed wearing your monitor, remove and place into box which has postage paid and place in your outgoing mailbox.  If for some reason you have misplaced your box then call our office and we can provide another box and/or mail it off for you.  Follow-Up: At [facility], you and your health needs are our priority.  As part of our continuing mission to provide you with exceptional heart care, we have created designated Provider Care Teams.  These Care Teams include your primary Cardiologist (physician) and Advanced Practice Providers (APPs -  Physician Assistants and Nurse Practitioners) who all work together to provide you with the care you need, when you need it.  We recommend signing up for the patient portal called "MyChart".  Sign up information is provided on this After Visit Summary.  MyChart is used to connect with patients for Virtual Visits (Telemedicine).  Patients are able to view lab/test results, encounter notes, upcoming appointments, etc.  Non-urgent messages can be sent to your provider as well.   To learn more about what you can do with MyChart, go to [email or web address].    Your next appointment:   6 month(s)  Provider:   You may see Dr. Steffanie Dunn or one of the following Advanced Practice Providers on your designated Care Team:   Sherie Don, NP

## 2022-07-30 DIAGNOSIS — R002 Palpitations: Secondary | ICD-10-CM | POA: Diagnosis not present

## 2022-07-30 DIAGNOSIS — R42 Dizziness and giddiness: Secondary | ICD-10-CM | POA: Diagnosis not present

## 2022-08-05 ENCOUNTER — Telehealth: Payer: Self-pay | Admitting: Cardiology

## 2022-08-05 ENCOUNTER — Telehealth: Payer: Self-pay

## 2022-08-05 NOTE — Telephone Encounter (Signed)
Tax inspector from [employer],  This email is to notify you that the patient listed below had an early fall-off issue where the patch fell off in the first 48 hours of wear. We have placed an order for a replacement device to be shipped to the patient and placed a hold on the billing, so the patient is not charged for the first device. If you have any questions, please respond to this email, or call us at [phone].  Patient initials: Langley Gauss Patient ID: [id number] SN: [id number] ORDER: [id number]  Ticket: [id number]   Account: [facility] [place] Thank you, Kandis Fantasia Customer Care

## 2022-08-05 NOTE — Telephone Encounter (Signed)
Per email notification received from [employer]:  Hello,     This email is to notify you that the patient listed below had an early fall-off issue where the patch fell off in the first 48 hours of wear. We have placed an order for a replacement device to be shipped to the patient and placed a hold on the billing, so the patient is not charged for the first device. If you have any questions, please respond to this email, or call us at [phone].     Patient initials: Darryl Gilbert  Patient ID: [id number]  SN: [id number] ORDER: [id number]  Ticket: [id number]   Account: [facility] [place]     Thank you,  Kandis Fantasia Customer Care

## 2022-08-09 ENCOUNTER — Other Ambulatory Visit: Payer: Self-pay | Admitting: Cardiology

## 2022-08-09 NOTE — Telephone Encounter (Signed)
Please advise on directions. Thank you!

## 2022-08-13 ENCOUNTER — Encounter: Payer: Self-pay | Admitting: Student in an Organized Health Care Education/Training Program

## 2022-08-13 ENCOUNTER — Ambulatory Visit (INDEPENDENT_AMBULATORY_CARE_PROVIDER_SITE_OTHER): Payer: 59 | Admitting: Student in an Organized Health Care Education/Training Program

## 2022-08-13 VITALS — BP 116/80 | HR 57 | Temp 97.6°F | Ht 72.0 in | Wt 201.2 lb

## 2022-08-13 DIAGNOSIS — R0602 Shortness of breath: Secondary | ICD-10-CM

## 2022-08-13 DIAGNOSIS — I5022 Chronic systolic (congestive) heart failure: Secondary | ICD-10-CM | POA: Diagnosis not present

## 2022-08-13 NOTE — Progress Notes (Signed)
Synopsis: Referred in for shortness of breath by [name], MD  Assessment & Plan:   1. Shortness of breath  He is presenting for the evaluation of undifferentiated dyspnea, with a differential that spans cardiac and pulmonary causes. He is certainly at risk for COPD given his significant smoking history, and we will obtain a pulmonary function test (spirometry, lung volumes, DLCO) to work this up further. His exam is also benign with no wheeze and no rales. I have reviewed his myoscan CT images, and while the quality of the images is poor, there is no glaring abnormality to suggest advanced ILD. His echocardiogram is notable for systolic and diastolic dysfunction, and this could certainly contribute to dyspnea. Furthermore, he's bradycardic and inadequate chronotropy is also on the differential. Should PFT's and imaging not be revelatory, a RHC could help elucidate the nature of his symptoms. I will place a referral to our lung cancer screening program to better evaluate his pulmonary parenchyma.   - Pulmonary Function Test [facility] Only; Future - Ambulatory Referral for Lung Cancer Scre   Return in about 2 months (around 10/13/2022).  I spent 60 minutes caring for this patient today, including preparing to see the patient, obtaining a medical history , reviewing a separately obtained history, performing a medically appropriate examination and/or evaluation, counseling and educating the patient/family/caregiver, ordering medications, tests, or procedures, documenting clinical information in the electronic health record, and independently interpreting results (not separately reported/billed) and communicating results to the patient/family/caregiver  Raechel Chute, MD [facility] 08/13/2022 8:56 AM    End of visit medications:  No orders of the defined types were placed in this encounter.    Current Outpatient Medications:    albuterol (VENTOLIN HFA) 108 (90  Base) MCG/ACT inhaler, Inhale 2 puffs into the lungs every 4 (four) hours as needed for wheezing or shortness of breath., Disp: 8 g, Rfl: 0   aspirin 81 MG chewable tablet, Chew 1 tablet (81 mg total) by mouth daily., Disp: 30 tablet, Rfl: 0   atorvastatin (LIPITOR) 80 MG tablet, Take 1 tablet (80 mg total) by mouth daily., Disp: 90 tablet, Rfl: 3   buPROPion (WELLBUTRIN XL) 300 MG 24 hr tablet, Take 300 mg by mouth daily., Disp: , Rfl:    carvedilol (COREG) 3.125 MG tablet, Take 1 tablet (3.125 mg total) by mouth 2 (two) times daily., Disp: 180 tablet, Rfl: 3   clopidogrel (PLAVIX) 75 MG tablet, Take 1 tablet (75 mg total) by mouth daily., Disp: 90 tablet, Rfl: 3   esomeprazole (NEXIUM) 20 MG capsule, Take 1 capsule (20 mg total) by mouth daily at 12 noon., Disp: 30 capsule, Rfl: 6   furosemide (LASIX) 20 MG tablet, Take 20 mg by mouth daily., Disp: , Rfl:    HYDROcodone-acetaminophen (NORCO) 10-325 MG tablet, Take 1 tablet by mouth 3 (three) times daily as needed., Disp: , Rfl:    nitroGLYCERIN (NITROSTAT) 0.4 MG SL tablet, Place 1 tablet (0.4 mg total) under the tongue every 5 (five) minutes as needed for chest pain., Disp: 25 tablet, Rfl: 3   potassium chloride (KLOR-CON) 10 MEQ tablet, Take 1 tablet (10 mEq total) by mouth daily., Disp: 90 tablet, Rfl: 3   sacubitril-valsartan (ENTRESTO) 24-26 MG, Take 1 tablet by mouth 2 (two) times daily., Disp: 180 tablet, Rfl: 3   tamsulosin (FLOMAX) 0.4 MG CAPS capsule, Take 0.4 mg by mouth daily., Disp: , Rfl:    Subjective:   PATIENT ID: Darryl Gilbert GENDER:  male DOB: May 20, 1955, MRN: [id number]  Chief Complaint  Patient presents with   pulmonary consult    SOB with exertion and at rest, dry cough and occ wheezing.     HPI  Darryl Gilbert is a pleasant 67 year old male presenting to clinic for the evaluation of shortness of breath.  Symptoms started around the time of his heart attack. He was previously in his usual state of health with no dyspnea  and no other respiratory symptoms. Following his heart attack, he's been short of breath at rest and with exertion. He does not feel that the dyspnea is worse with exertion. He reports no wheezing, no chest pain, no chest tightness, no fevers, and no chills. He has occasional night sweats. He hasn't had any recurrent episodes of bronchitis. He recently had COVID (seen in the ED in February of 2024) and felt that his dyspnea got 50% worse while he was ill with it. It is now slowly improving, and he feels close to baseline (3/10, 0 being best). The inhalers given to him in the ED did not make any difference to his dyspnea.  Mr. Headley suffered an inferior STEMI 12/2020 with thrombotic occlusion of his proximal left circumflex. He was also found to have a 70% stenotic RCA. LVEDP during the procedure was measured at 22 mmHg. A drug eluting stent was deployed to the left circumflex and he's been on DAPT since. Most recent TTE 04/2022 showed an EF of 35-40% with global hypokinesis and severe hypokinesis of the inferior wall and grade II diastolic dysfunction. RV function was normal. Patient's Prasugrel was switched to Clopidogrel in February of this year. He is also maintained on carvedilol and sacubitril-valsartan. He had atrial fibrillation in the setting of his MI, but this has not recurred and he is not maintained on anti-coagulation.  Patient reports a long standing history of smoking, quit in 12/2020. He smoked between 1.5 and 2 packs a day for around 50 years. He has previously worked in [employer], as a Teaching laboratory technician, Occupational hygienist, and currently works a Office manager. He has some exposure to asbestos while working with car breaks in the past.  Ancillary information including prior medications, full medical/surgical/family/social histories, and PFTs (when available) are listed below and have been reviewed.   Review of Systems  Constitutional:  Negative for chills and fever.  Respiratory:  Positive for shortness  of breath. Negative for hemoptysis, sputum production and wheezing.   Cardiovascular:  Negative for chest pain.  Skin:  Negative for rash.     Objective:   Vitals:   08/13/22 0835  BP: 116/80  Pulse: (!) 57  Temp: 97.6 F (36.4 C)  TempSrc: Temporal  SpO2: 99%  Weight: 201 lb 3.2 oz (91.3 kg)  Height: 6' (1.829 m)   99% on RA BMI Readings from Last 3 Encounters:  08/13/22 27.29 kg/m  07/26/22 27.53 kg/m  07/09/22 27.28 kg/m   Wt Readings from Last 3 Encounters:  08/13/22 201 lb 3.2 oz (91.3 kg)  07/26/22 203 lb (92.1 kg)  07/09/22 201 lb 2 oz (91.2 kg)    Physical Exam Constitutional:      Appearance: Normal appearance. He is not ill-appearing.  HENT:     Head: Normocephalic.     Mouth/Throat:     Mouth: Mucous membranes are moist.  Cardiovascular:     Rate and Rhythm: Normal rate and regular rhythm.     Pulses: Normal pulses.     Heart sounds: Normal heart sounds.  Pulmonary:     Effort: Pulmonary effort is normal. No respiratory distress.     Breath sounds: Normal breath sounds. No wheezing, rhonchi or rales.  Abdominal:     Palpations: Abdomen is soft.  Neurological:     General: No focal deficit present.     Mental Status: He is alert and oriented to person, place, and time. Mental status is at baseline.     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   Past Surgical History:  Procedure Laterality Date   COLONOSCOPY WITH PROPOFOL N/A 05/07/2022   Procedure: COLONOSCOPY WITH PROPOFOL;  Surgeon: Wyline Mood, MD;  Location: [facility] ENDOSCOPY;  Service: Gastroenterology;  Laterality: N/A;   CORONARY/GRAFT ACUTE MI REVASCULARIZATION N/A 12/20/2020   Procedure:  Coronary/Graft Acute MI Revascularization;  Surgeon: [name], MD;  Location: [facility] INVASIVE CV LAB;  Service: Cardiovascular;  Laterality: N/A;   FRACTURE SURGERY     Left leg fracture, rod placement   LEFT HEART CATH AND CORONARY ANGIOGRAPHY N/A 12/20/2020   Procedure: LEFT HEART CATH AND CORONARY ANGIOGRAPHY;  Surgeon: [name], MD;  Location: [facility] INVASIVE CV LAB;  Service: Cardiovascular;  Laterality: N/A;   LEFT HEART CATH AND CORONARY ANGIOGRAPHY N/A 02/23/2021   Procedure: LEFT HEART CATH AND CORONARY ANGIOGRAPHY;  Surgeon: [name], MD;  Location: [facility] INVASIVE CV LAB;  Service: Cardiovascular;  Laterality: N/A;    Social History   Socioeconomic History   Marital status: Divorced    Spouse name: Not on file   Number of children: Not on file   Years of education: Not on file   Highest education level: Not on file  Occupational History   Not on file  Tobacco Use   Smoking status: Former    Packs/day: 1.50    Years: 45.00    Additional pack years: 0.00    Total pack years: 67.50    Types: Cigarettes    Quit date: 12/20/2020    Years since quitting: 1.6   Smokeless tobacco: Never  Vaping Use   Vaping Use: Never used  Substance and Sexual Activity   Alcohol use: Not Currently   Drug use: Never   Sexual activity: Not on file  Other Topics Concern   Not on file  Social History Narrative   Not on file   Social Determinants of Health   Financial Resource Strain: Not on file  Food Insecurity: Not on file  Transportation Needs: Not on file  Physical Activity: Not on file  Stress: Not on file  Social Connections: Not on file  Intimate Partner Violence: Not on file     Allergies  Allergen Reactions   Amiodarone Other (See Comments)    QT prolongation in the context of inferior STEMI and IV amiodarone use. Avoid all QT prolonging medications.      CBC    Component Value Date/Time   WBC 4.1 06/07/2022 1536   RBC 4.61 06/07/2022 1536    HGB 14.1 06/07/2022 1536   HGB 14.6 03/30/2021 0849   HCT 43.3 06/07/2022 1536   HCT 41.6 03/30/2021 0849   PLT 183 06/07/2022 1536   PLT 229  03/30/2021 0849   MCV 93.9 06/07/2022 1536   MCV 89 03/30/2021 0849   MCH 30.6 06/07/2022 1536   MCHC 32.6 06/07/2022 1536   RDW 13.0 06/07/2022 1536   RDW 12.0 03/30/2021 0849   LYMPHSABS 2.1 06/01/2021 0705   LYMPHSABS 2.1 03/30/2021 0849   MONOABS 0.3 06/01/2021 0705   EOSABS 0.4 06/01/2021 0705   EOSABS 0.5 (H) 03/30/2021 0849   BASOSABS 0.1 06/01/2021 0705   BASOSABS 0.1 03/30/2021 0849    Pulmonary Functions Testing Results:     No data to display          Outpatient Medications Prior to Visit  Medication Sig Dispense Refill   albuterol (VENTOLIN HFA) 108 (90 Base) MCG/ACT inhaler Inhale 2 puffs into the lungs every 4 (four) hours as needed for wheezing or shortness of breath. 8 g 0   aspirin 81 MG chewable tablet Chew 1 tablet (81 mg total) by mouth daily. 30 tablet 0   atorvastatin (LIPITOR) 80 MG tablet Take 1 tablet (80 mg total) by mouth daily. 90 tablet 3   buPROPion (WELLBUTRIN XL) 300 MG 24 hr tablet Take 300 mg by mouth daily.     carvedilol (COREG) 3.125 MG tablet Take 1 tablet (3.125 mg total) by mouth 2 (two) times daily. 180 tablet 3   clopidogrel (PLAVIX) 75 MG tablet Take 1 tablet (75 mg total) by mouth daily. 90 tablet 3   esomeprazole (NEXIUM) 20 MG capsule Take 1 capsule (20 mg total) by mouth daily at 12 noon. 30 capsule 6   furosemide (LASIX) 20 MG tablet Take 20 mg by mouth daily.     HYDROcodone-acetaminophen (NORCO) 10-325 MG tablet Take 1 tablet by mouth 3 (three) times daily as needed.     nitroGLYCERIN (NITROSTAT) 0.4 MG SL tablet Place 1 tablet (0.4 mg total) under the tongue every 5 (five) minutes as needed for chest pain. 25 tablet 3   potassium chloride (KLOR-CON) 10 MEQ tablet Take 1 tablet (10 mEq total) by mouth daily. 90 tablet 3   sacubitril-valsartan (ENTRESTO) 24-26 MG Take 1 tablet by mouth 2  (two) times daily. 180 tablet 3   tamsulosin (FLOMAX) 0.4 MG CAPS capsule Take 0.4 mg by mouth daily.     No facility-administered medications prior to visit.

## 2022-09-16 DIAGNOSIS — I471 Supraventricular tachycardia, unspecified: Secondary | ICD-10-CM | POA: Insufficient documentation

## 2022-09-16 NOTE — Progress Notes (Signed)
Cardiology Office Note Date:  09/17/2022  Patient ID:  Darryl Gilbert, Darryl Gilbert 1955-11-13, MRN [id number] PCP:  Dortha Kern, MD  Cardiologist:  Lorine Bears, MD Electrophysiologist: Lanier Prude, MD    Chief Complaint: palpitations, discuss zio monitor results  History of Present Illness: UILLIAM Gilbert is a 67 y.o. 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  I saw him 6 weeks ago where he was having frequent palpitation episodes. Separately, having diaphoretic episodes, typically in the mornings. He also c/o SOB after covid diagnosis, and referred to pulm for further eval.   He saw pulm last month, who recommended PFT testing and lung Ca screening. Neither have been done.   He has no change in symptoms since the last time I saw him.  No edema, no chest pain. Continues to be dizzy, lightheaded at times. Checks pulse at home and readings are always in the 50s.    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    Hyperlipidemia LDL goal <70    Hypertension    Ischemic cardiomyopathy     Past Surgical History:  Procedure Laterality Date   COLONOSCOPY WITH PROPOFOL N/A 05/07/2022   Procedure: COLONOSCOPY WITH PROPOFOL;  Surgeon: Wyline Mood, MD;  Location: [facility] ENDOSCOPY;  Service: Gastroenterology;  Laterality: N/A;   CORONARY/GRAFT ACUTE MI REVASCULARIZATION N/A 12/20/2020   Procedure: Coronary/Graft Acute MI Revascularization;  Surgeon: [name], MD;  Location: [facility] INVASIVE CV LAB;  Service: Cardiovascular;  Laterality: N/A;   FRACTURE SURGERY     Left leg fracture, rod placement   LEFT  HEART CATH AND CORONARY ANGIOGRAPHY N/A 12/20/2020   Procedure: LEFT HEART CATH AND CORONARY ANGIOGRAPHY;  Surgeon: [name], MD;  Location: [facility] INVASIVE CV LAB;  Service: Cardiovascular;  Laterality: N/A;   LEFT HEART CATH AND CORONARY ANGIOGRAPHY N/A 02/23/2021   Procedure: LEFT HEART CATH AND CORONARY ANGIOGRAPHY;  Surgeon: [name], MD;  Location: [facility] INVASIVE CV LAB;  Service: Cardiovascular;  Laterality: N/A;    Current Outpatient Medications  Medication Instructions   albuterol (VENTOLIN HFA) 108 (90 Base) MCG/ACT inhaler 2 puffs, Inhalation, Every 4 hours PRN   aspirin 81 mg, Oral, Daily   atorvastatin (LIPITOR) 80 mg, Oral, Daily   bisoprolol (ZEBETA) 5 mg, Oral, Daily   buPROPion (WELLBUTRIN XL) 300 mg, Oral, Daily   clopidogrel (PLAVIX) 75 mg, Oral, Daily   esomeprazole (NEXIUM) 20 mg, Oral, Daily   furosemide (LASIX) 20 mg, Oral, Daily   HYDROcodone-acetaminophen (NORCO) 10-325 MG tablet 1 tablet, Oral, 3 times daily PRN   nitroGLYCERIN (NITROSTAT) 0.4 mg, Sublingual, Every 5 min PRN   potassium chloride (KLOR-CON) 10 MEQ tablet 10 mEq, Oral, Daily   sacubitril-valsartan (ENTRESTO) 24-26 MG 1 tablet, Oral, 2 times daily   tamsulosin (FLOMAX) 0.4 mg, Oral, Daily    Social History:  The patient  reports that he quit smoking about 20 months ago. His smoking use included cigarettes. He has a 67.50 pack-year smoking history. He has never used smokeless tobacco. He reports that he does not currently use alcohol. He  reports that he does not use drugs.   Family History:   The patient's family history is not on file.  ROS:  Please see the history of present illness. All other systems are reviewed and otherwise negative.   PHYSICAL EXAM:  VS:  BP 114/74 (BP Location: Left Arm, Patient Position: Sitting, Cuff Size: Normal)   Pulse (!) 52   Ht 6' (1.829 m)   Wt 200 lb 4 oz (90.8 kg)   SpO2 97%   BMI 27.16 kg/m  BMI: Body mass index is 27.16 kg/m.  GEN- The  patient is well appearing, alert and oriented x 3 today.   Lungs- Clear to ausculation bilaterally, normal work of breathing.  Heart- Regular, bradycardic rate and rhythm, no murmurs, rubs or gallops Extremities- No peripheral edema, warm, dry   EKG is ordered. Personal review of EKG from today shows:  SB, rate 52bpm; PRWP  Recent Labs: 03/04/2022: B Natriuretic Peptide 85.5 06/07/2022: ALT 17; BUN 20; Creatinine, Ser 1.23; Hemoglobin 14.1; Platelets 183; Potassium 3.7; Sodium 137  03/04/2022: Cholesterol 130; HDL 37; LDL Cholesterol 58; Total CHOL/HDL Ratio 3.5; Triglycerides 175; VLDL 35   CrCl cannot be calculated (Patient's most recent lab result is older than the maximum 21 days allowed.).   Wt Readings from Last 3 Encounters:  09/17/22 200 lb 4 oz (90.8 kg)  08/13/22 201 lb 3.2 oz (91.3 kg)  07/26/22 203 lb (92.1 kg)     Additional studies reviewed include: Previous EP, cardiology notes.   Long term monitor, 09/02/2022 HR 46 - 226, average 65 bpm. 50 nonsustained SVT, longest 13.8 seconds with an average rate of 159 bpm. Frequent supraventricular ectopy, 17.3% Occasional ventricular ectopy, 2.4%   HR 41 - 245, average 61 bpm. 1 nonsustained VT lasting 5 beats 196 nonsustained SVT, longest 22.1 seconds with an average rate of 166 bpm. <1% AF burden Frequent supraventricular ectopy, 17.3% Occasional ventricular ectopy, 2.8%  TTE, 04/27/2022  1. Left ventricular ejection fraction, by estimation, is 35 to 40%. The left ventricle has moderately decreased function. The left ventricle demonstrates global hypokinesis with severe hypokinesis of the inferior wall. The left ventricular internal cavity size was mildly dilated. Left ventricular diastolic parameters are consistent with Grade II diastolic dysfunction (pseudonormalization).   2. Right ventricular systolic function is normal. The right ventricular size is normal.   3. The mitral valve is normal in structure. Mild to  moderate mitral valve regurgitation. No evidence of mitral stenosis.   4. The aortic valve has an indeterminant number of cusps. Aortic valve regurgitation is not visualized. No aortic stenosis is present.   5. The inferior vena cava is normal in size with greater than 50% respiratory variability, suggesting right atrial pressure of 3 mmHg.   Comparison(s): 06/25/21-EF 45%.    ASSESSMENT AND PLAN:  #) SVT #) palpitations Recent ambulatory monitor showed high atrial ectopy burden Continues to be quite symptomatic He has been taking coreg 3.125 BID, up-titration limited by bradycardia Metop in past also led to bradycardia down to mid-40s Trial bisoprolol 2.5mg  nightly  - close follow-up scheduled   #) HFmrEF NYHA II symptoms Warm and Euvolemic on exam GDMT: adjust BB as above, entresto -- consider additional GDMT at follow-up Diuretic: 20mg  lasix daily   #) drug-induced PMVT Avoid QT prolonging medications   Current medicines are reviewed at length with the patient today.   The patient does not have concerns regarding his medicines.  The following changes were made today:   STOP coreg,  last dose tonight START bisprolol nightly, starting tomorrow  Labs/ tests ordered today include:  Orders Placed This Encounter  Procedures   EKG 12-Lead     Disposition: Follow up with EP APP in  2-3 weeks   Will have him go by pulm office at check-out to schedule testing as ordered by pulm   Signed, Sherie Don, NP  09/17/22  9:59 AM  Electrophysiology [facility]

## 2022-09-17 ENCOUNTER — Other Ambulatory Visit: Payer: Self-pay

## 2022-09-17 ENCOUNTER — Encounter: Payer: Self-pay | Admitting: Cardiology

## 2022-09-17 ENCOUNTER — Ambulatory Visit: Payer: 59 | Attending: Cardiology | Admitting: Cardiology

## 2022-09-17 VITALS — BP 114/74 | HR 52 | Ht 72.0 in | Wt 200.2 lb

## 2022-09-17 DIAGNOSIS — I471 Supraventricular tachycardia, unspecified: Secondary | ICD-10-CM

## 2022-09-17 DIAGNOSIS — I5022 Chronic systolic (congestive) heart failure: Secondary | ICD-10-CM | POA: Diagnosis not present

## 2022-09-17 DIAGNOSIS — Z87891 Personal history of nicotine dependence: Secondary | ICD-10-CM

## 2022-09-17 DIAGNOSIS — Z122 Encounter for screening for malignant neoplasm of respiratory organs: Secondary | ICD-10-CM

## 2022-09-17 DIAGNOSIS — I4729 Other ventricular tachycardia: Secondary | ICD-10-CM

## 2022-09-17 MED ORDER — BISOPROLOL FUMARATE 5 MG PO TABS: 5.0000 mg | ORAL_TABLET | Freq: Every day | ORAL | 1 refills | Status: DC

## 2022-09-17 NOTE — Patient Instructions (Addendum)
Medication Instructions:   STOP CARVEDILOL  START Tomorrow- Bisoprolol 2.5 mg tablet daily at bedtime.  *If you need a refill on your cardiac medications before your next appointment, please call your pharmacy*   Lab Work:  No lab work ordered today.  If you have labs (blood work) drawn today and your tests are completely normal, you will receive your results only by: MyChart Message (if you have MyChart) OR A paper copy in the mail If you have any lab test that is abnormal or we need to change your treatment, we will call you to review the results.   Testing/Procedures:  No testing ordered today.   Follow-Up: At [facility], you and your health needs are our priority.  As part of our continuing mission to provide you with exceptional heart care, we have created designated Provider Care Teams.  These Care Teams include your primary Cardiologist (physician) and Advanced Practice Providers (APPs -  Physician Assistants and Nurse Practitioners) who all work together to provide you with the care you need, when you need it.  We recommend signing up for the patient portal called "MyChart".  Sign up information is provided on this After Visit Summary.  MyChart is used to connect with patients for Virtual Visits (Telemedicine).  Patients are able to view lab/test results, encounter notes, upcoming appointments, etc.  Non-urgent messages can be sent to your provider as well.   To learn more about what you can do with MyChart, go to [email or web address].    Your next appointment:   3 week(s) Please schedule on a Wednesday that Lalla Brothers is here per [place]  Provider:   Sherie Don, NP     *Please speak to Pulmonary scheduler to schedule testing with Pulmonary.

## 2022-10-01 MED ORDER — METOPROLOL SUCCINATE ER 25 MG PO TB24: 25.0000 mg | ORAL_TABLET | Freq: Every day | ORAL | 3 refills | Status: DC

## 2022-10-01 NOTE — Telephone Encounter (Signed)
Called and spoke with patient. Informed patient of Sherie Don, NP following recommendations.  Sherie Don, NP    The last medication we could try would be the long-acting metoprolol (toprol) 25mg  nightly. I looked through his chart and he was only on the short-acting metop (lopressor). The long acting may prevent yoyo symptoms with his HR. Or, if he felt better on the coreg instead of bisoprolol, going back on coreg is also fine with me.    Patient states that he wants to try the long acting Metoprolol. Prescription sent to patient's preferred pharmacy.

## 2022-10-06 ENCOUNTER — Other Ambulatory Visit: Payer: Self-pay | Admitting: Cardiology

## 2022-10-07 ENCOUNTER — Other Ambulatory Visit: Payer: Self-pay

## 2022-10-07 MED ORDER — FUROSEMIDE 20 MG PO TABS: 20.0000 mg | ORAL_TABLET | Freq: Every day | ORAL | 3 refills | Status: DC

## 2022-10-12 ENCOUNTER — Ambulatory Visit: Payer: 59 | Attending: Student in an Organized Health Care Education/Training Program

## 2022-10-12 DIAGNOSIS — J449 Chronic obstructive pulmonary disease, unspecified: Secondary | ICD-10-CM | POA: Insufficient documentation

## 2022-10-12 DIAGNOSIS — R0602 Shortness of breath: Secondary | ICD-10-CM

## 2022-10-12 LAB — PULMONARY FUNCTION TEST ARMC ONLY
DL/VA % pred: 57 %
DL/VA: 2.36 ml/min/mmHg/L
DLCO unc % pred: 56 %
DLCO unc: 16.02 ml/min/mmHg
FEF 25-75 Post: 1.61 L/sec
FEF 25-75 Pre: 1.58 L/sec
FEF2575-%Change-Post: 1 %
FEF2575-%Pred-Post: 55 %
FEF2575-%Pred-Pre: 54 %
FEV1-%Change-Post: 1 %
FEV1-%Pred-Post: 73 %
FEV1-%Pred-Pre: 72 %
FEV1-Post: 2.71 L
FEV1-Pre: 2.68 L
FEV1FVC-%Change-Post: -1 %
FEV1FVC-%Pred-Pre: 92 %
FEV6-%Change-Post: 2 %
FEV6-%Pred-Post: 83 %
FEV6-%Pred-Pre: 81 %
FEV6-Post: 3.92 L
FEV6-Pre: 3.84 L
FEV6FVC-%Change-Post: 0 %
FEV6FVC-%Pred-Post: 103 %
FEV6FVC-%Pred-Pre: 104 %
FVC-%Change-Post: 2 %
FVC-%Pred-Post: 80 %
FVC-%Pred-Pre: 78 %
FVC-Post: 3.96 L
FVC-Pre: 3.87 L
Post FEV1/FVC ratio: 68 %
Post FEV6/FVC ratio: 99 %
Pre FEV1/FVC ratio: 69 %
Pre FEV6/FVC Ratio: 99 %
RV % pred: 150 %
RV: 3.7 L
TLC % pred: 105 %
TLC: 7.79 L

## 2022-10-12 MED ORDER — ALBUTEROL SULFATE (2.5 MG/3ML) 0.083% IN NEBU
2.5000 mg | INHALATION_SOLUTION | Freq: Once | RESPIRATORY_TRACT | Status: AC
  Administered 2022-10-12: 2.5 mg via RESPIRATORY_TRACT
  Filled 2022-10-12: qty 3

## 2022-10-13 ENCOUNTER — Ambulatory Visit: Payer: 59 | Admitting: Cardiology

## 2022-10-14 NOTE — Progress Notes (Signed)
Cardiology Office Note Date:  10/15/2022  Patient ID:  Darryl Gilbert, Darryl Gilbert September 20, 1955, MRN [id number] PCP:  Dortha Kern, MD  Cardiologist:  Lorine Bears, MD Electrophysiologist: Lanier Prude, MD    Chief Complaint: palpitations  History of Present Illness: Darryl Gilbert is a 67 y.o. 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  He was admitted 12/2020 with an inferior myocardial infarction and was also noted to have atrial fibrillation with rapid ventricular rates.  He received PCI.  His initial EF was down at 35 to 40%.  Amiodarone was started for his atrial fibrillation.  He had a polymorphic VT arrest in the setting of amiodarone use.  Beta-blockers were then used for his atrial fibrillation, which were limited d/t baseline bradycardia.    He last saw Dr. Lalla Brothers 01/2022, was slowly continuing to improve from MI. He saw Dr. Kirke Corin 06/2022 after having covid 05/2022, continuing to have dyspnea with minimal exertion and exertional cough. Plan to be on long-term dual antiplatelet therapy. Ref to pulm for post-covid symptoms. I saw him 07/2022 for routine follow-up, where patient was have 3 separate concerning symptomatic episodes. Zio monitor showed frequent SVT > 17%. I saw him in follow-up about a month ago, initially titrated BB from coreg > bisoprolol. Patient messaged clinic afterwards that he was having more SOB and headaches. Bisoprolol > toprol via [employer].   On follow-up today, he states he is tolerating toprol much better than bisoprolol. He continues to have palpitation episodes as before but they seem to be less often.  He has been seen by pulm today, diagnosed with COPD and started on inhaler.   He continues to have SOB with activity, but is also limited in mobility d/t hip and back pain.   She denies chest pain, PND, orthopnea,  nausea, vomiting, dizziness, syncope, edema, weight gain, or early satiety.    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    Hyperlipidemia LDL goal <70    Hypertension    Ischemic cardiomyopathy     Past Surgical History:  Procedure Laterality Date   COLONOSCOPY WITH PROPOFOL N/A 05/07/2022   Procedure: COLONOSCOPY WITH PROPOFOL;  Surgeon: Wyline Mood, MD;  Location: [facility] ENDOSCOPY;  Service: Gastroenterology;  Laterality: N/A;   CORONARY/GRAFT ACUTE MI REVASCULARIZATION N/A 12/20/2020   Procedure: Coronary/Graft Acute MI Revascularization;  Surgeon: [name], MD;  Location: [facility] INVASIVE CV LAB;  Service: Cardiovascular;  Laterality: N/A;   FRACTURE SURGERY     Left leg fracture, rod placement   LEFT HEART CATH AND CORONARY ANGIOGRAPHY N/A 12/20/2020   Procedure: LEFT HEART CATH AND CORONARY ANGIOGRAPHY;  Surgeon: [name], MD;  Location: [facility] INVASIVE CV LAB;  Service: Cardiovascular;  Laterality: N/A;   LEFT HEART CATH AND CORONARY ANGIOGRAPHY N/A 02/23/2021   Procedure: LEFT HEART CATH AND CORONARY ANGIOGRAPHY;  Surgeon: [name], MD;  Location: [facility] INVASIVE CV LAB;  Service: Cardiovascular;  Laterality: N/A;    Current Outpatient Medications  Medication Instructions   albuterol (VENTOLIN HFA) 108 (90 Base) MCG/ACT inhaler 2 puffs, Inhalation, Every 4 hours PRN   aspirin 81  mg, Oral, Daily   atorvastatin (LIPITOR) 80 mg, Oral, Daily   buPROPion (WELLBUTRIN XL) 300 mg, Oral, Daily   clopidogrel (PLAVIX) 75 mg, Oral, Daily   esomeprazole (NEXIUM) 20 mg, Oral, Daily   furosemide (LASIX) 20 mg, Oral, Daily   HYDROcodone-acetaminophen (NORCO) 10-325 MG tablet 1 tablet, Oral, 3 times daily PRN   metoprolol succinate (TOPROL XL) 25 mg, Oral, Daily at bedtime    nitroGLYCERIN (NITROSTAT) 0.4 mg, Sublingual, Every 5 min PRN   potassium chloride (KLOR-CON) 10 MEQ tablet 10 mEq, Oral, Daily   sacubitril-valsartan (ENTRESTO) 24-26 MG 1 tablet, Oral, 2 times daily   tamsulosin (FLOMAX) 0.4 mg, Oral, Daily   umeclidinium-vilanterol (ANORO ELLIPTA) 62.5-25 MCG/ACT AEPB 1 puff, Inhalation, Daily    Social History:  The patient  reports that he quit smoking about 21 months ago. His smoking use included cigarettes. He has a 67.50 pack-year smoking history. He has never used smokeless tobacco. He reports that he does not currently use alcohol. He reports that he does not use drugs.   Family History:   The patient's family history is not on file.  ROS:  Please see the history of present illness. All other systems are reviewed and otherwise negative.   PHYSICAL EXAM:  VS:  BP 110/76 (BP Location: Left Arm, Patient Position: Sitting, Cuff Size: Normal)   Ht 6' (1.829 m)   Wt 202 lb (91.6 kg)   SpO2 98%   BMI 27.40 kg/m  BMI: Body mass index is 27.4 kg/m.  GEN- The patient is well appearing, alert and oriented x 3 today.   Lungs- Clear to ausculation bilaterally, normal work of breathing.  Heart- Regular, bradycardic rate and rhythm, no murmurs, rubs or gallops Extremities- No peripheral edema, warm, dry   EKG is ordered. Personal review of EKG from today shows: EKG Interpretation Date/Time:  Friday October 15 2022 09:23:49 EDT Ventricular Rate:  46 PR Interval:  154 QRS Duration:  84 QT Interval:  460 QTC Calculation: 402 R Axis:   43  Text Interpretation: Sinus bradycardia When compared with ECG of 07-Jun-2022 15:33, Premature supraventricular complexes are no longer Present Confirmed by Sherie Don [phone]) on 10/15/2022 9:33:08 AM   Recent Labs: 03/04/2022: B Natriuretic Peptide 85.5 06/07/2022: ALT 17; BUN 20; Creatinine, Ser 1.23; Hemoglobin 14.1; Platelets 183; Potassium 3.7; Sodium 137  03/04/2022: Cholesterol 130; HDL 37; LDL Cholesterol  58; Total CHOL/HDL Ratio 3.5; Triglycerides 175; VLDL 35   CrCl cannot be calculated (Patient's most recent lab result is older than the maximum 21 days allowed.).   Wt Readings from Last 3 Encounters:  10/15/22 202 lb (91.6 kg)  10/15/22 202 lb (91.6 kg)  09/17/22 200 lb 4 oz (90.8 kg)     Additional studies reviewed include: Previous EP, cardiology notes.   Long term monitor, 09/02/2022 HR 46 - 226, average 65 bpm. 50 nonsustained SVT, longest 13.8 seconds with an average rate of 159 bpm. Frequent supraventricular ectopy, 17.3% Occasional ventricular ectopy, 2.4%   HR 41 - 245, average 61 bpm. 1 nonsustained VT lasting 5 beats 196 nonsustained SVT, longest 22.1 seconds with an average rate of 166 bpm. <1% AF burden Frequent supraventricular ectopy, 17.3% Occasional ventricular ectopy, 2.8%  TTE, 04/27/2022  1. Left ventricular ejection fraction, by estimation, is 35 to 40%. The left ventricle has moderately decreased function. The left ventricle demonstrates global hypokinesis with severe hypokinesis of the inferior wall. The left ventricular internal cavity size was mildly dilated. Left ventricular diastolic parameters are  consistent with Grade II diastolic dysfunction (pseudonormalization).   2. Right ventricular systolic function is normal. The right ventricular size is normal.   3. The mitral valve is normal in structure. Mild to moderate mitral valve regurgitation. No evidence of mitral stenosis.   4. The aortic valve has an indeterminant number of cusps. Aortic valve regurgitation is not visualized. No aortic stenosis is present.   5. The inferior vena cava is normal in size with greater than 50% respiratory variability, suggesting right atrial pressure of 3 mmHg.   Comparison(s): 06/25/21-EF 45%.    ASSESSMENT AND PLAN:  #) SVT #) palpitations Recent ambulatory monitor showed high atrial ectopy burden Intolerant of bisoprolol Coreg ineffective Continues to be  symptomatic, though improved on toprol HR with ambulation increased to 69 Continue toprol 25mg  7d zio to re-assess SVT burden Up-titration of BB limited by bradycardia - consider EP Study if SVT burden remains high  #) HFmrEF NYHA II symptoms Warm and Euvolemic on exam GDMT: toprol, entresto Up titration of GDMT limited by bradycardia and BP Diuretic: 20mg  lasix daily  #) drug-induced PMVT Avoid QT prolonging medications    Current medicines are reviewed at length with the patient today.   The patient does not have concerns regarding his medicines.  The following changes were made today:  none  Labs/ tests ordered today include:  EKG    Disposition: Follow up with Dr. Lalla Brothers  4-6 weeks    Signed, Sherie Don, NP 10/15/22 10:10 AM Electrophysiology [facility]

## 2022-10-15 ENCOUNTER — Ambulatory Visit (INDEPENDENT_AMBULATORY_CARE_PROVIDER_SITE_OTHER): Payer: 59 | Admitting: Student in an Organized Health Care Education/Training Program

## 2022-10-15 ENCOUNTER — Encounter: Payer: Self-pay | Admitting: Cardiology

## 2022-10-15 ENCOUNTER — Ambulatory Visit (INDEPENDENT_AMBULATORY_CARE_PROVIDER_SITE_OTHER): Payer: 59

## 2022-10-15 ENCOUNTER — Ambulatory Visit: Payer: 59 | Attending: Cardiology | Admitting: Cardiology

## 2022-10-15 ENCOUNTER — Encounter: Payer: Self-pay | Admitting: Student in an Organized Health Care Education/Training Program

## 2022-10-15 VITALS — BP 110/76 | Ht 72.0 in | Wt 202.0 lb

## 2022-10-15 VITALS — BP 118/60 | HR 47 | Temp 97.7°F | Ht 72.0 in | Wt 202.0 lb

## 2022-10-15 DIAGNOSIS — J439 Emphysema, unspecified: Secondary | ICD-10-CM

## 2022-10-15 DIAGNOSIS — R0602 Shortness of breath: Secondary | ICD-10-CM | POA: Diagnosis not present

## 2022-10-15 DIAGNOSIS — R002 Palpitations: Secondary | ICD-10-CM | POA: Diagnosis not present

## 2022-10-15 DIAGNOSIS — I471 Supraventricular tachycardia, unspecified: Secondary | ICD-10-CM

## 2022-10-15 MED ORDER — ANORO ELLIPTA 62.5-25 MCG/ACT IN AEPB: 1.0000 | INHALATION_SPRAY | Freq: Every day | RESPIRATORY_TRACT | 11 refills | Status: DC

## 2022-10-15 NOTE — Patient Instructions (Signed)
Medication Instructions:  Your physician recommends that you continue on your current medications as directed. Please refer to the Current Medication list given to you today.  *If you need a refill on your cardiac medications before your next appointment, please call your pharmacy*   Lab Work: No labs ordered  If you have labs (blood work) drawn today and your tests are completely normal, you will receive your results only by: MyChart Message (if you have MyChart) OR A paper copy in the mail If you have any lab test that is abnormal or we need to change your treatment, we will call you to review the results.   Testing/Procedures: Your physician has recommended that you wear a Zio monitor.   This monitor is a medical device that records the heart's electrical activity. Doctors most often use these monitors to diagnose arrhythmias. Arrhythmias are problems with the speed or rhythm of the heartbeat. The monitor is a small device applied to your chest. You can wear one while you do your normal daily activities. While wearing this monitor if you have any symptoms to push the button and record what you felt. Once you have worn this monitor for the period of time provider prescribed (Usually 14 days), you will return the monitor device in the postage paid box. Once it is returned they will download the data collected and provide [place] with a report which the provider will then review and we will call you with those results. Important tips:  Avoid showering during the first 24 hours of wearing the monitor. Avoid excessive sweating to help maximize wear time. Do not submerge the device, no hot tubs, and no swimming pools. Keep any lotions or oils away from the patch. After 24 hours you may shower with the patch on. Take brief showers with your back facing the shower head.  Do not remove patch once it has been placed because that will interrupt data and decrease adhesive wear time. Push the button when  you have any symptoms and write down what you were feeling. Once you have completed wearing your monitor, remove and place into box which has postage paid and place in your outgoing mailbox.  If for some reason you have misplaced your box then call our office and we can provide another box and/or mail it off for you.  Follow-Up: At [facility], you and your health needs are our priority.  As part of our continuing mission to provide you with exceptional heart care, we have created designated Provider Care Teams.  These Care Teams include your primary Cardiologist (physician) and Advanced Practice Providers (APPs -  Physician Assistants and Nurse Practitioners) who all work together to provide you with the care you need, when you need it.  We recommend signing up for the patient portal called "MyChart".  Sign up information is provided on this After Visit Summary.  MyChart is used to connect with patients for Virtual Visits (Telemedicine).  Patients are able to view lab/test results, encounter notes, upcoming appointments, etc.  Non-urgent messages can be sent to your provider as well.   To learn more about what you can do with MyChart, go to [email or web address].    Your next appointment:   4-6 week(s)  Provider:   Steffanie Dunn, MD

## 2022-10-15 NOTE — Progress Notes (Signed)
Assessment & Plan:   #Pulmonary emphysema(HCC) #COPD GOLD 2B  Presents for follow up of dyspnea, with a differential that includes cardiac and pulmonary etiologies. PFT's show obstruction with an FEV1 of 2.71L (73% predicted, z-score -2.01) consistent with COPD GOLD 2B with a moderate decline in DLCO. He was enrolled in our lung cancer screening program and is pending his initial CT in 2 weeks.  His echocardiogram is notable for systolic and diastolic dysfunction, and this could certainly contribute to dyspnea compounding his COPD (and likely emphysema). This was also probably made worse by recent COVID in February.  Today, we discussed that the next step in therapy would be to proceed with a trial of bronchodilators. I will initiate LABA/LAMA therapy and re-assess the patient's symptoms on follow up. At that point I will also review his low dose CT. I counseled Mr. Bennion on the possible side effects from LAMA therapy.  - umeclidinium-vilanterol (ANORO ELLIPTA) 62.5-25 MCG/ACT AEPB; Inhale 1 puff into the lungs daily.  Dispense: 30 each; Refill: 11   Return in about 3 months (around 01/15/2023).  I spent 30 minutes caring for this patient today, including preparing to see the patient, obtaining a medical history , reviewing a separately obtained history, performing a medically appropriate examination and/or evaluation, counseling and educating the patient/family/caregiver, ordering medications, tests, or procedures, and documenting clinical information in the electronic health record  Raechel Chute, MD [facility] 10/15/2022 8:50 AM    End of visit medications:  Meds ordered this encounter  Medications   umeclidinium-vilanterol (ANORO ELLIPTA) 62.5-25 MCG/ACT AEPB    Sig: Inhale 1 puff into the lungs daily.    Dispense:  30 each    Refill:  11     Current Outpatient Medications:    albuterol (VENTOLIN HFA) 108 (90 Base) MCG/ACT inhaler, Inhale 2 puffs into  the lungs every 4 (four) hours as needed for wheezing or shortness of breath., Disp: 8 g, Rfl: 0   aspirin 81 MG chewable tablet, Chew 1 tablet (81 mg total) by mouth daily., Disp: 30 tablet, Rfl: 0   atorvastatin (LIPITOR) 80 MG tablet, Take 1 tablet (80 mg total) by mouth daily., Disp: 90 tablet, Rfl: 3   buPROPion (WELLBUTRIN XL) 300 MG 24 hr tablet, Take 300 mg by mouth daily., Disp: , Rfl:    clopidogrel (PLAVIX) 75 MG tablet, Take 1 tablet (75 mg total) by mouth daily., Disp: 90 tablet, Rfl: 3   esomeprazole (NEXIUM) 20 MG capsule, Take 1 capsule (20 mg total) by mouth daily at 12 noon., Disp: 30 capsule, Rfl: 6   furosemide (LASIX) 20 MG tablet, Take 1 tablet (20 mg total) by mouth daily., Disp: 90 tablet, Rfl: 3   HYDROcodone-acetaminophen (NORCO) 10-325 MG tablet, Take 1 tablet by mouth 3 (three) times daily as needed., Disp: , Rfl:    metoprolol succinate (TOPROL XL) 25 MG 24 hr tablet, Take 1 tablet (25 mg total) by mouth at bedtime., Disp: 90 tablet, Rfl: 3   nitroGLYCERIN (NITROSTAT) 0.4 MG SL tablet, Place 1 tablet (0.4 mg total) under the tongue every 5 (five) minutes as needed for chest pain., Disp: 25 tablet, Rfl: 3   potassium chloride (KLOR-CON) 10 MEQ tablet, Take 1 tablet (10 mEq total) by mouth daily., Disp: 90 tablet, Rfl: 3   sacubitril-valsartan (ENTRESTO) 24-26 MG, Take 1 tablet by mouth 2 (two) times daily., Disp: 180 tablet, Rfl: 3   tamsulosin (FLOMAX) 0.4 MG CAPS capsule, Take 0.4 mg by  mouth daily., Disp: , Rfl:    umeclidinium-vilanterol (ANORO ELLIPTA) 62.5-25 MCG/ACT AEPB, Inhale 1 puff into the lungs daily., Disp: 30 each, Rfl: 11   Subjective:   PATIENT ID: Darryl Gilbert GENDER: male DOB: 09-26-1955, MRN: [id number]  Chief Complaint  Patient presents with   Follow-up    SOB and occ dry cough     HPI  Mr. Claverie is a pleasant 67 year old male presenting for follow up.  Symptoms are unchanged compared to his initial visit. He remains short of breath with  exertion and denies any other symptoms. Denies wheezing, chest pain and chest tightness. He had his PFT's and is here to discuss the results. Did not feel improvement with the albuterol given during the PFT.  Symptoms started around the time of his heart attack. He was previously in his usual state of health with no dyspnea and no other respiratory symptoms. Following his heart attack, he's been short of breath at rest and with exertion. He does not feel that the dyspnea is worse with exertion. He reports no wheezing, no chest pain, no chest tightness, no fevers, and no chills. He has occasional night sweats. He hasn't had any recurrent episodes of bronchitis. He recently had COVID (seen in the ED in February of 2024) and felt that his dyspnea got 50% worse while he was ill with it. It improved, but never back to his baseline.  Mr. Fly suffered an inferior STEMI 12/2020 with thrombotic occlusion of his proximal left circumflex. He was also found to have a 70% stenotic RCA. LVEDP during the procedure was measured at 22 mmHg. A drug eluting stent was deployed to the left circumflex and he's been on DAPT since. Most recent TTE 04/2022 showed an EF of 35-40% with global hypokinesis and severe hypokinesis of the inferior wall and grade II diastolic dysfunction. RV function was normal. Patient's Prasugrel was switched to Clopidogrel in February of this year. He is now maintained on bisoprolol (recently switched by cardiology) and sacubitril-valsartan. He had atrial fibrillation in the setting of his MI, but this has not recurred and he is not maintained on anti-coagulation.   Patient reports a long standing history of smoking, quit in 12/2020. He smoked between 1.5 and 2 packs a day for around 50 years. He has previously worked in [employer], as a Teaching laboratory technician, Occupational hygienist, and currently works a Office manager. He has some exposure to asbestos while working with car breaks in the past.  Ancillary information  including prior medications, full medical/surgical/family/social histories, and PFTs (when available) are listed below and have been reviewed.   Review of Systems  Constitutional:  Negative for chills and fever.  Respiratory:  Positive for shortness of breath. Negative for hemoptysis, sputum production and wheezing.   Cardiovascular:  Negative for chest pain.  Skin:  Negative for rash.     Objective:   Vitals:   10/15/22 0834  BP: 118/60  Pulse: (!) 47  Temp: 97.7 F (36.5 C)  TempSrc: Temporal  SpO2: 96%  Weight: 202 lb (91.6 kg)  Height: 6' (1.829 m)   96% on RA BMI Readings from Last 3 Encounters:  10/15/22 27.40 kg/m  09/17/22 27.16 kg/m  08/13/22 27.29 kg/m   Wt Readings from Last 3 Encounters:  10/15/22 202 lb (91.6 kg)  09/17/22 200 lb 4 oz (90.8 kg)  08/13/22 201 lb 3.2 oz (91.3 kg)    Physical Exam Constitutional:      Appearance: Normal appearance. He is  not ill-appearing.  HENT:     Head: Normocephalic.     Mouth/Throat:     Mouth: Mucous membranes are moist.  Cardiovascular:     Rate and Rhythm: Normal rate and regular rhythm.     Pulses: Normal pulses.     Heart sounds: Normal heart sounds.  Pulmonary:     Effort: Pulmonary effort is normal. No respiratory distress.     Breath sounds: Normal breath sounds. No wheezing, rhonchi or rales.  Abdominal:     Palpations: Abdomen is soft.  Neurological:     General: No focal deficit present.     Mental Status: He is alert and oriented to person, place, and time. Mental status is at baseline.       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   Past Surgical History:  Procedure Laterality Date    COLONOSCOPY WITH PROPOFOL N/A 05/07/2022   Procedure: COLONOSCOPY WITH PROPOFOL;  Surgeon: Wyline Mood, MD;  Location: [facility];  Service: Gastroenterology;  Laterality: N/A;   CORONARY/GRAFT ACUTE MI REVASCULARIZATION N/A 12/20/2020   Procedure: Coronary/Graft Acute MI Revascularization;  Surgeon: [name], MD;  Location: [facility] INVASIVE CV LAB;  Service: Cardiovascular;  Laterality: N/A;   FRACTURE SURGERY     Left leg fracture, rod placement   LEFT HEART CATH AND CORONARY ANGIOGRAPHY N/A 12/20/2020   Procedure: LEFT HEART CATH AND CORONARY ANGIOGRAPHY;  Surgeon: [name], MD;  Location: [facility] INVASIVE CV LAB;  Service: Cardiovascular;  Laterality: N/A;   LEFT HEART CATH AND CORONARY ANGIOGRAPHY N/A 02/23/2021   Procedure: LEFT HEART CATH AND CORONARY ANGIOGRAPHY;  Surgeon: [name], MD;  Location: [facility] INVASIVE CV LAB;  Service: Cardiovascular;  Laterality: N/A;    Social History   Socioeconomic History   Marital status: Divorced    Spouse name: Not on file   Number of children: Not on file   Years of education: Not on file   Highest education level: Not on file  Occupational History   Not on file  Tobacco Use   Smoking status: Former    Packs/day: 1.50    Years: 45.00    Additional pack years: 0.00    Total pack years: 67.50    Types: Cigarettes    Quit date: 12/20/2020    Years since quitting: 1.8   Smokeless tobacco: Never  Vaping Use   Vaping Use: Never used  Substance and Sexual Activity   Alcohol use: Not Currently   Drug use: Never   Sexual activity: Not on file  Other Topics Concern   Not on file  Social History Narrative   Not on file   Social Determinants of Health   Financial Resource Strain: Not on file  Food Insecurity: Not on file  Transportation Needs: Not on file  Physical Activity: Not on file  Stress: Not on file  Social Connections: Not on file  Intimate Partner Violence: Not on file     Allergies  Allergen  Reactions   Amiodarone Other (See Comments)    QT prolongation in the context of inferior STEMI and IV amiodarone use. Avoid all QT prolonging medications.  CBC    Component Value Date/Time   WBC 4.1 06/07/2022 1536   RBC 4.61 06/07/2022 1536   HGB 14.1 06/07/2022 1536   HGB 14.6 03/30/2021 0849   HCT 43.3 06/07/2022 1536   HCT 41.6 03/30/2021 0849   PLT 183 06/07/2022 1536   PLT 229 03/30/2021 0849   MCV 93.9 06/07/2022 1536   MCV 89 03/30/2021 0849   MCH 30.6 06/07/2022 1536   MCHC 32.6 06/07/2022 1536   RDW 13.0 06/07/2022 1536   RDW 12.0 03/30/2021 0849   LYMPHSABS 2.1 06/01/2021 0705   LYMPHSABS 2.1 03/30/2021 0849   MONOABS 0.3 06/01/2021 0705   EOSABS 0.4 06/01/2021 0705   EOSABS 0.5 (H) 03/30/2021 0849   BASOSABS 0.1 06/01/2021 0705   BASOSABS 0.1 03/30/2021 0849    Pulmonary Functions Testing Results:    Latest Ref Rng & Units 10/12/2022    8:36 AM  PFT Results  FVC-Pre L 3.87   FVC-Predicted Pre % 78   FVC-Post L 3.96   FVC-Predicted Post % 80   Pre FEV1/FVC % % 69   Post FEV1/FCV % % 68   FEV1-Pre L 2.68   FEV1-Predicted Pre % 72   FEV1-Post L 2.71   DLCO uncorrected ml/min/mmHg 16.02   DLCO UNC% % 56   DLVA Predicted % 57   TLC L 7.79   TLC % Predicted % 105   RV % Predicted % 150     Outpatient Medications Prior to Visit  Medication Sig Dispense Refill   albuterol (VENTOLIN HFA) 108 (90 Base) MCG/ACT inhaler Inhale 2 puffs into the lungs every 4 (four) hours as needed for wheezing or shortness of breath. 8 g 0   aspirin 81 MG chewable tablet Chew 1 tablet (81 mg total) by mouth daily. 30 tablet 0   atorvastatin (LIPITOR) 80 MG tablet Take 1 tablet (80 mg total) by mouth daily. 90 tablet 3   buPROPion (WELLBUTRIN XL) 300 MG 24 hr tablet Take 300 mg by mouth daily.     clopidogrel (PLAVIX) 75 MG tablet Take 1 tablet (75 mg total) by mouth daily. 90 tablet 3   esomeprazole (NEXIUM) 20 MG capsule Take 1 capsule (20 mg total) by mouth daily at 12  noon. 30 capsule 6   furosemide (LASIX) 20 MG tablet Take 1 tablet (20 mg total) by mouth daily. 90 tablet 3   HYDROcodone-acetaminophen (NORCO) 10-325 MG tablet Take 1 tablet by mouth 3 (three) times daily as needed.     metoprolol succinate (TOPROL XL) 25 MG 24 hr tablet Take 1 tablet (25 mg total) by mouth at bedtime. 90 tablet 3   nitroGLYCERIN (NITROSTAT) 0.4 MG SL tablet Place 1 tablet (0.4 mg total) under the tongue every 5 (five) minutes as needed for chest pain. 25 tablet 3   potassium chloride (KLOR-CON) 10 MEQ tablet Take 1 tablet (10 mEq total) by mouth daily. 90 tablet 3   sacubitril-valsartan (ENTRESTO) 24-26 MG Take 1 tablet by mouth 2 (two) times daily. 180 tablet 3   tamsulosin (FLOMAX) 0.4 MG CAPS capsule Take 0.4 mg by mouth daily.     No facility-administered medications prior to visit.

## 2022-10-26 ENCOUNTER — Ambulatory Visit (INDEPENDENT_AMBULATORY_CARE_PROVIDER_SITE_OTHER): Payer: 59 | Admitting: Primary Care

## 2022-10-26 DIAGNOSIS — Z87891 Personal history of nicotine dependence: Secondary | ICD-10-CM | POA: Diagnosis not present

## 2022-10-26 NOTE — Patient Instructions (Signed)
Thank you for participating in the [facility]. It was our pleasure to meet you today. We will call you with the results of your scan within the next few days. Your scan will be assigned a Lung RADS category score by the physicians reading the scans.  This Lung RADS score determines follow up scanning.  See below for description of categories, and follow up screening recommendations. We will be in touch to schedule your follow up screening annually or based on recommendations of our providers. We will fax a copy of your scan results to your Primary Care Physician, or the physician who referred you to the program, to ensure they have the results. Please call the office if you have any questions or concerns regarding your scanning experience or results.  Our office number is [phone]. Please speak with Denise Phelps, RN. , or  Denise Buckner RN, They are  our Lung Cancer Screening RN.'s If They are unavailable when you call, Please leave a message on the voice mail. We will return your call at our earliest convenience.This voice mail is monitored several times a day.  Remember, if your scan is normal, we will scan you annually as long as you continue to meet the criteria for the program. (Age 50-80, Current smoker or smoker who has quit within the last 15 years). If you are a smoker, remember, quitting is the single most powerful action that you can take to decrease your risk of lung cancer and other pulmonary, breathing related problems. We know quitting is hard, and we are here to help.  Please let us know if there is anything we can do to help you meet your goal of quitting. If you are a former smoker, congratulations. We are proud of you! Remain smoke free! Remember you can refer friends or family members through the number above.  We will screen them to make sure they meet criteria for the program. Thank you for helping us take better care of you by  participating in Lung Screening.  You can receive free nicotine replacement therapy ( patches, gum or mints) by calling 1-800-QUIT NOW. Please call so we can get you on the path to becoming  a non-smoker. I know it is hard, but you can do this!  Lung RADS Categories:  Lung RADS 1: no nodules or definitely non-concerning nodules.  Recommendation is for a repeat annual scan in 12 months.  Lung RADS 2:  nodules that are non-concerning in appearance and behavior with a very low likelihood of becoming an active cancer. Recommendation is for a repeat annual scan in 12 months.  Lung RADS 3: nodules that are probably non-concerning , includes nodules with a low likelihood of becoming an active cancer.  Recommendation is for a 6-month repeat screening scan. Often noted after an upper respiratory illness. We will be in touch to make sure you have no questions, and to schedule your 6-month scan.  Lung RADS 4 A: nodules with concerning findings, recommendation is most often for a follow up scan in 3 months or additional testing based on our provider's assessment of the scan. We will be in touch to make sure you have no questions and to schedule the recommended 3 month follow up scan.  Lung RADS 4 B:  indicates findings that are concerning. We will be in touch with you to schedule additional diagnostic testing based on our provider's  assessment of the scan.  Other options for assistance in smoking cessation (   As covered by your insurance benefits)  Hypnosis for smoking cessation  [employer] [phone]  Acupuncture for smoking cessation  [facility] [phone]   

## 2022-10-26 NOTE — Progress Notes (Signed)
Virtual Visit via Telephone Note  I connected with Darryl Gilbert on 10/26/22 at  4:00 PM EDT by telephone and verified that I am speaking with the correct person using two identifiers.  Location: Patient: Home Provider: Office   I discussed the limitations, risks, security and privacy concerns of performing an evaluation and management service by telephone and the availability of in person appointments. I also discussed with the patient that there may be a patient responsible charge related to this service. The patient expressed understanding and agreed to proceed.    Shared Decision Making Visit Lung Cancer Screening Program [phone])   Eligibility: Age 67 y.o. Pack Years Smoking History Calculation -150 pack year hx (# packs/per year x # years smoked) Recent History of coughing up blood  no Unexplained weight loss? no ( >Than 15 pounds within the last 6 months ) Prior History Lung / other cancer no (Diagnosis within the last 5 years already requiring surveillance chest CT Scans). Smoking Status Former Smoker Former Smokers: Years since quit: 2 years  Quit Date: 2022  Visit Components: Discussion included one or more decision making aids. yes Discussion included risk/benefits of screening. yes Discussion included potential follow up diagnostic testing for abnormal scans. yes Discussion included meaning and risk of over diagnosis. yes Discussion included meaning and risk of False Positives. yes Discussion included meaning of total radiation exposure. yes  Counseling Included: Importance of adherence to annual lung cancer LDCT screening. yes Impact of comorbidities on ability to participate in the program. yes Ability and willingness to under diagnostic treatment. yes  Smoking Cessation Counseling: Current Smokers:  Discussed importance of smoking cessation. yes Information about tobacco cessation classes and interventions provided to patient. yes Patient provided with  "ticket" for LDCT Scan. NA Symptomatic Patient. no  Counseling(Intermediate counseling: > three minutes) 99406 Diagnosis Code: Tobacco Use Z72.0 Asymptomatic Patient yes  Counseling (Intermediate counseling: > three minutes counseling) U0454 Former Smokers:  Discussed the importance of maintaining cigarette abstinence. yes Diagnosis Code: Personal History of Nicotine Dependence. U98.119 Information about tobacco cessation classes and interventions provided to patient. Yes Patient provided with "ticket" for LDCT Scan. NA Written Order for Lung Cancer Screening with LDCT placed in Epic. Yes (CT Chest Lung Cancer Screening Low Dose W/O CM) [id number] Z12.2-Screening of respiratory organs Z87.891-Personal history of nicotine dependence   I have spent 25 minutes of face to face/ virtual visit   time with Darryl Gilbert discussing the risks and benefits of lung cancer screening. We viewed / discussed a power point together that explained in detail the above noted topics. We paused at intervals to allow for questions to be asked and answered to ensure understanding.We discussed that the single most powerful action that he can take to decrease his risk of developing lung cancer is to quit smoking. We discussed whether or not he is ready to commit to setting a quit date. We discussed options for tools to aid in quitting smoking including nicotine replacement therapy, non-nicotine medications, support groups, Quit Smart classes, and behavior modification. We discussed that often times setting smaller, more achievable goals, such as eliminating 1 cigarette a day for a week and then 2 cigarettes a day for a week can be helpful in slowly decreasing the number of cigarettes smoked. This allows for a sense of accomplishment as well as providing a clinical benefit. I provided him  with smoking cessation  information  with contact information for community resources, classes, free nicotine replacement therapy, and access to  mobile apps, text messaging, and on-line smoking cessation help. I have also provided him  the office contact information in the event he needs to contact me, or the screening staff. We discussed the time and location of the scan, and that either Abigail Miyamoto RN, Karlton Lemon, RN  or I will call / send a letter with the results within 24-72 hours of receiving them. The patient verbalized understanding of all of  the above and had no further questions upon leaving the office. They have my contact information in the event they have any further questions.  I spent 3-5 minutes counseling on smoking cessation and the health risks of continued tobacco abuse.  I explained to the patient that there has been a high incidence of coronary artery disease noted on these exams. I explained that this is a non-gated exam therefore degree or severity cannot be determined. This patient is on statin therapy. I have asked the patient to follow-up with their PCP regarding any incidental finding of coronary artery disease and management with diet or medication as their PCP  feels is clinically indicated. The patient verbalized understanding of the above and had no further questions upon completion of the visit.     Glenford Bayley, NP

## 2022-10-29 ENCOUNTER — Ambulatory Visit
Admission: RE | Admit: 2022-10-29 | Discharge: 2022-10-29 | Disposition: A | Discharge status: Home / Self Care | Payer: 59 | Source: Ambulatory Visit | Attending: Family Medicine | Admitting: Family Medicine

## 2022-10-29 DIAGNOSIS — Z87891 Personal history of nicotine dependence: Secondary | ICD-10-CM | POA: Diagnosis present

## 2022-10-29 DIAGNOSIS — Z122 Encounter for screening for malignant neoplasm of respiratory organs: Secondary | ICD-10-CM | POA: Insufficient documentation

## 2022-11-01 ENCOUNTER — Other Ambulatory Visit: Payer: Self-pay | Admitting: Acute Care

## 2022-11-01 DIAGNOSIS — Z87891 Personal history of nicotine dependence: Secondary | ICD-10-CM

## 2022-11-01 DIAGNOSIS — Z122 Encounter for screening for malignant neoplasm of respiratory organs: Secondary | ICD-10-CM

## 2022-11-09 MED ORDER — METOPROLOL SUCCINATE ER 25 MG PO TB24: 12.5000 mg | ORAL_TABLET | Freq: Every day | ORAL | Status: DC

## 2022-11-09 NOTE — Telephone Encounter (Signed)
Spoke with patient who agreed to cut medication in half and let us know if he feels better taking 12.5 mg of metoprolol daily. Patient expressed gratitude for the call.

## 2022-11-15 NOTE — Progress Notes (Unsigned)
  Electrophysiology Office Follow up Visit Note:    Date:  11/17/2022   ID:  Darryl Gilbert, DOB Nov 20, 1955, MRN [id number]  PCP:  Dortha Kern, MD  [facility] Cardiologist:  Lorine Bears, MD  [facility] Electrophysiologist:  Lanier Prude, MD    Interval History:    Darryl Gilbert is a 67 y.o. male who presents for a follow up visit.   Last seen 10/15/2022 by Luella Cook. 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  Previously rx'd bisoprolol but did not tolerate. Then changed to metoprolol.  At the appt with SR, still reported palpitations and a monitor was ordered.  Today he tells me he did not tolerate 25 mg of Toprol-XL.  He says that he was unable to leave his house due to the side effects of the drug.  He reduced his dose to 12.5 mg by mouth once daily and has tolerated that better.    Past medical, surgical, social and family history were reviewed.  ROS:   Please see the history of present illness.    All other systems reviewed and are negative.  EKGs/Labs/Other Studies Reviewed:    The following studies were reviewed today:  11/04/2022 [facility] personally reviewed HR 38 - 179 bpm, average 53 bpm. 1 NSVT lasting 4 beats. 30 nonsustained SVT, longest 26.5 seconds.  Frequent supraventricular ectopy, 8.4%. Rare ventricular ectopy. No atrial fibrillation. No sustained arrhythmias. Improved burden of SVT and supraventricular ectopy compared to prior monitor.         Physical Exam:    VS:  BP 118/72   Pulse (!) 54   Ht 6' (1.829 m)   Wt 200 lb (90.7 kg)   SpO2 97%   BMI 27.12 kg/m     Wt Readings from Last 3 Encounters:  11/17/22 200 lb (90.7 kg)  10/29/22 200 lb (90.7 kg)  10/15/22 202 lb (91.6 kg)     GEN:  Well nourished, well developed in no acute distress CARDIAC: RRR, no murmurs, rubs, gallops RESPIRATORY:  Clear to auscultation without rales, wheezing or rhonchi       ASSESSMENT:    1.  Palpitations   2. SVT (supraventricular tachycardia)   3. Chronic systolic heart failure (HCC)   4. Polymorphic ventricular tachycardia (HCC)   5. Paroxysmal atrial fibrillation (HCC)    PLAN:    In order of problems listed above:  #Palpitations #SVT Improved on metoprolol. Continue current dose.  He has not tolerated higher doses of beta-blockers.  #HFrEF #CAD #ICM NYHA II. Warm and dry on exam. Continue lasix, metoprolol, entresto  #pAF Previously in setting of acute MI. No known recurrence. No AF on monitor.  Follow up 6 months with APP.       Signed, Steffanie Dunn, MD, [facility], [facility] 11/17/2022 8:02 AM    Electrophysiology  [facility]

## 2022-11-17 ENCOUNTER — Ambulatory Visit: Payer: 59 | Attending: Cardiology | Admitting: Cardiology

## 2022-11-17 ENCOUNTER — Encounter: Payer: Self-pay | Admitting: Cardiology

## 2022-11-17 VITALS — BP 118/72 | HR 54 | Ht 72.0 in | Wt 200.0 lb

## 2022-11-17 DIAGNOSIS — R002 Palpitations: Secondary | ICD-10-CM

## 2022-11-17 DIAGNOSIS — I4729 Other ventricular tachycardia: Secondary | ICD-10-CM | POA: Diagnosis not present

## 2022-11-17 DIAGNOSIS — I480 Paroxysmal atrial fibrillation: Secondary | ICD-10-CM

## 2022-11-17 DIAGNOSIS — I471 Supraventricular tachycardia, unspecified: Secondary | ICD-10-CM | POA: Diagnosis not present

## 2022-11-17 DIAGNOSIS — I5022 Chronic systolic (congestive) heart failure: Secondary | ICD-10-CM

## 2022-11-17 NOTE — Patient Instructions (Signed)
Medication Instructions:  Your physician recommends that you continue on your current medications as directed. Please refer to the Current Medication list given to you today.  *If you need a refill on your cardiac medications before your next appointment, please call your pharmacy*  Follow-Up: At [facility], you and your health needs are our priority.  As part of our continuing mission to provide you with exceptional heart care, we have created designated Provider Care Teams.  These Care Teams include your primary Cardiologist (physician) and Advanced Practice Providers (APPs -  Physician Assistants and Nurse Practitioners) who all work together to provide you with the care you need, when you need it.  Your next appointment:   6 month(s)  Provider:   Suzann Riddle, NP  

## 2022-12-02 ENCOUNTER — Inpatient Hospital Stay: Payer: 59

## 2022-12-02 ENCOUNTER — Emergency Department: Payer: 59

## 2022-12-02 ENCOUNTER — Inpatient Hospital Stay
Admission: EM | Admit: 2022-12-02 | Discharge: 2022-12-07 | DRG: 445 | Disposition: A | Discharge status: Home / Self Care | Payer: 59 | Attending: Student | Admitting: Student

## 2022-12-02 ENCOUNTER — Other Ambulatory Visit: Payer: Self-pay

## 2022-12-02 ENCOUNTER — Encounter: Payer: Self-pay | Admitting: Internal Medicine

## 2022-12-02 DIAGNOSIS — K5730 Diverticulosis of large intestine without perforation or abscess without bleeding: Secondary | ICD-10-CM | POA: Diagnosis present

## 2022-12-02 DIAGNOSIS — R001 Bradycardia, unspecified: Secondary | ICD-10-CM | POA: Diagnosis present

## 2022-12-02 DIAGNOSIS — R1013 Epigastric pain: Secondary | ICD-10-CM | POA: Diagnosis not present

## 2022-12-02 DIAGNOSIS — I700 Atherosclerosis of aorta: Secondary | ICD-10-CM | POA: Diagnosis present

## 2022-12-02 DIAGNOSIS — I491 Atrial premature depolarization: Secondary | ICD-10-CM | POA: Diagnosis not present

## 2022-12-02 DIAGNOSIS — J449 Chronic obstructive pulmonary disease, unspecified: Secondary | ICD-10-CM | POA: Diagnosis present

## 2022-12-02 DIAGNOSIS — R1084 Generalized abdominal pain: Secondary | ICD-10-CM

## 2022-12-02 DIAGNOSIS — Z87891 Personal history of nicotine dependence: Secondary | ICD-10-CM

## 2022-12-02 DIAGNOSIS — I255 Ischemic cardiomyopathy: Secondary | ICD-10-CM | POA: Diagnosis present

## 2022-12-02 DIAGNOSIS — Z7902 Long term (current) use of antithrombotics/antiplatelets: Secondary | ICD-10-CM | POA: Diagnosis not present

## 2022-12-02 DIAGNOSIS — K810 Acute cholecystitis: Secondary | ICD-10-CM | POA: Diagnosis not present

## 2022-12-02 DIAGNOSIS — Z79899 Other long term (current) drug therapy: Secondary | ICD-10-CM | POA: Diagnosis not present

## 2022-12-02 DIAGNOSIS — I130 Hypertensive heart and chronic kidney disease with heart failure and stage 1 through stage 4 chronic kidney disease, or unspecified chronic kidney disease: Secondary | ICD-10-CM | POA: Diagnosis present

## 2022-12-02 DIAGNOSIS — I471 Supraventricular tachycardia, unspecified: Secondary | ICD-10-CM | POA: Diagnosis present

## 2022-12-02 DIAGNOSIS — I25119 Atherosclerotic heart disease of native coronary artery with unspecified angina pectoris: Secondary | ICD-10-CM | POA: Diagnosis present

## 2022-12-02 DIAGNOSIS — N1831 Chronic kidney disease, stage 3a: Secondary | ICD-10-CM | POA: Diagnosis present

## 2022-12-02 DIAGNOSIS — Z7982 Long term (current) use of aspirin: Secondary | ICD-10-CM

## 2022-12-02 DIAGNOSIS — I252 Old myocardial infarction: Secondary | ICD-10-CM | POA: Diagnosis not present

## 2022-12-02 DIAGNOSIS — I493 Ventricular premature depolarization: Secondary | ICD-10-CM | POA: Diagnosis present

## 2022-12-02 DIAGNOSIS — I213 ST elevation (STEMI) myocardial infarction of unspecified site: Secondary | ICD-10-CM | POA: Diagnosis present

## 2022-12-02 DIAGNOSIS — E785 Hyperlipidemia, unspecified: Secondary | ICD-10-CM | POA: Diagnosis present

## 2022-12-02 DIAGNOSIS — I5022 Chronic systolic (congestive) heart failure: Secondary | ICD-10-CM | POA: Diagnosis present

## 2022-12-02 DIAGNOSIS — R1011 Right upper quadrant pain: Secondary | ICD-10-CM | POA: Diagnosis not present

## 2022-12-02 DIAGNOSIS — R7401 Elevation of levels of liver transaminase levels: Secondary | ICD-10-CM | POA: Diagnosis present

## 2022-12-02 DIAGNOSIS — I7143 Infrarenal abdominal aortic aneurysm, without rupture: Secondary | ICD-10-CM | POA: Diagnosis present

## 2022-12-02 DIAGNOSIS — Z955 Presence of coronary angioplasty implant and graft: Secondary | ICD-10-CM

## 2022-12-02 DIAGNOSIS — E7800 Pure hypercholesterolemia, unspecified: Secondary | ICD-10-CM | POA: Diagnosis not present

## 2022-12-02 DIAGNOSIS — I2583 Coronary atherosclerosis due to lipid rich plaque: Secondary | ICD-10-CM | POA: Diagnosis not present

## 2022-12-02 DIAGNOSIS — F32A Depression, unspecified: Secondary | ICD-10-CM | POA: Diagnosis present

## 2022-12-02 DIAGNOSIS — I480 Paroxysmal atrial fibrillation: Secondary | ICD-10-CM | POA: Diagnosis present

## 2022-12-02 DIAGNOSIS — M549 Dorsalgia, unspecified: Secondary | ICD-10-CM | POA: Diagnosis present

## 2022-12-02 DIAGNOSIS — I2510 Atherosclerotic heart disease of native coronary artery without angina pectoris: Secondary | ICD-10-CM | POA: Diagnosis not present

## 2022-12-02 DIAGNOSIS — G8929 Other chronic pain: Secondary | ICD-10-CM | POA: Diagnosis present

## 2022-12-02 DIAGNOSIS — R109 Unspecified abdominal pain: Secondary | ICD-10-CM | POA: Diagnosis present

## 2022-12-02 DIAGNOSIS — I5020 Unspecified systolic (congestive) heart failure: Secondary | ICD-10-CM | POA: Diagnosis not present

## 2022-12-02 HISTORY — DX: Nicotine dependence, unspecified, uncomplicated: F17.200

## 2022-12-02 HISTORY — DX: Reserved for inherently not codable concepts without codable children: IMO0001

## 2022-12-02 HISTORY — DX: Infrarenal abdominal aortic aneurysm, without rupture: I71.43

## 2022-12-02 LAB — HEPATIC FUNCTION PANEL
ALT: 85 U/L — ABNORMAL HIGH (ref 0–44)
AST: 119 U/L — ABNORMAL HIGH (ref 15–41)
Albumin: 4.2 g/dL (ref 3.5–5.0)
Alkaline Phosphatase: 107 U/L (ref 38–126)
Bilirubin, Direct: 0.6 mg/dL — ABNORMAL HIGH (ref 0.0–0.2)
Indirect Bilirubin: 1.1 mg/dL — ABNORMAL HIGH (ref 0.3–0.9)
Total Bilirubin: 1.7 mg/dL — ABNORMAL HIGH (ref 0.3–1.2)
Total Protein: 7.8 g/dL (ref 6.5–8.1)

## 2022-12-02 LAB — CBC
HCT: 44.7 % (ref 39.0–52.0)
Hemoglobin: 14.9 g/dL (ref 13.0–17.0)
MCH: 30.7 pg (ref 26.0–34.0)
MCHC: 33.3 g/dL (ref 30.0–36.0)
MCV: 92.2 fL (ref 80.0–100.0)
Platelets: 209 10*3/uL (ref 150–400)
RBC: 4.85 MIL/uL (ref 4.22–5.81)
RDW: 13.3 % (ref 11.5–15.5)
WBC: 6.4 10*3/uL (ref 4.0–10.5)
nRBC: 0 % (ref 0.0–0.2)

## 2022-12-02 LAB — BASIC METABOLIC PANEL
Anion gap: 10 (ref 5–15)
BUN: 18 mg/dL (ref 8–23)
CO2: 24 mmol/L (ref 22–32)
Calcium: 9.5 mg/dL (ref 8.9–10.3)
Chloride: 103 mmol/L (ref 98–111)
Creatinine, Ser: 1.43 mg/dL — ABNORMAL HIGH (ref 0.61–1.24)
GFR, Estimated: 54 mL/min — ABNORMAL LOW (ref >60)
Glucose, Bld: 120 mg/dL — ABNORMAL HIGH (ref 70–99)
Potassium: 3.9 mmol/L (ref 3.5–5.1)
Sodium: 137 mmol/L (ref 135–145)

## 2022-12-02 LAB — LIPASE, BLOOD: Lipase: 44 U/L (ref 11–51)

## 2022-12-02 LAB — GAMMA GT: GGT: 212 U/L — ABNORMAL HIGH (ref 7–50)

## 2022-12-02 LAB — TROPONIN I (HIGH SENSITIVITY)
Troponin I (High Sensitivity): 10 ng/L (ref <18)
Troponin I (High Sensitivity): 9 ng/L (ref <18)

## 2022-12-02 MED ORDER — PIPERACILLIN-TAZOBACTAM 3.375 G IVPB
3.3750 g | Freq: Three times a day (TID) | INTRAVENOUS | Status: DC
  Administered 2022-12-03 – 2022-12-07 (×12): 3.375 g via INTRAVENOUS
  Filled 2022-12-02 (×12): qty 50

## 2022-12-02 MED ORDER — SODIUM CHLORIDE 0.9 % IV SOLN: INTRAVENOUS | Status: AC

## 2022-12-02 MED ORDER — HEPARIN SODIUM (PORCINE) 5000 UNIT/ML IJ SOLN
5000.0000 [IU] | Freq: Three times a day (TID) | INTRAMUSCULAR | Status: DC
  Administered 2022-12-02 – 2022-12-03 (×3): 5000 [IU] via SUBCUTANEOUS
  Filled 2022-12-02 (×3): qty 1

## 2022-12-02 MED ORDER — SODIUM CHLORIDE 0.9% FLUSH
3.0000 mL | Freq: Two times a day (BID) | INTRAVENOUS | Status: DC
  Administered 2022-12-02 – 2022-12-06 (×7): 3 mL via INTRAVENOUS

## 2022-12-02 MED ORDER — ACETAMINOPHEN 650 MG RE SUPP: 650.0000 mg | Freq: Four times a day (QID) | RECTAL | Status: DC | PRN

## 2022-12-02 MED ORDER — MORPHINE SULFATE (PF) 2 MG/ML IV SOLN
2.0000 mg | INTRAVENOUS | Status: DC | PRN
  Administered 2022-12-03 (×2): 2 mg via INTRAVENOUS
  Filled 2022-12-02 (×2): qty 1

## 2022-12-02 MED ORDER — HYDROMORPHONE HCL 1 MG/ML IJ SOLN
1.0000 mg | Freq: Once | INTRAMUSCULAR | Status: AC
  Administered 2022-12-02: 1 mg via INTRAVENOUS
  Filled 2022-12-02: qty 1

## 2022-12-02 MED ORDER — PANTOPRAZOLE SODIUM 40 MG IV SOLR
40.0000 mg | Freq: Once | INTRAVENOUS | Status: AC
  Administered 2022-12-02: 40 mg via INTRAVENOUS
  Filled 2022-12-02: qty 10

## 2022-12-02 MED ORDER — IOHEXOL 300 MG/ML  SOLN
100.0000 mL | Freq: Once | INTRAMUSCULAR | Status: AC | PRN
  Administered 2022-12-02: 100 mL via INTRAVENOUS

## 2022-12-02 MED ORDER — ACETAMINOPHEN 325 MG PO TABS: 650.0000 mg | ORAL_TABLET | Freq: Four times a day (QID) | ORAL | Status: DC | PRN

## 2022-12-02 MED ORDER — HYDROMORPHONE HCL 1 MG/ML IJ SOLN
0.5000 mg | Freq: Once | INTRAMUSCULAR | Status: AC
  Administered 2022-12-02: 0.5 mg via INTRAVENOUS
  Filled 2022-12-02: qty 0.5

## 2022-12-02 MED ORDER — GADOBUTROL 1 MMOL/ML IV SOLN
9.0000 mL | Freq: Once | INTRAVENOUS | Status: AC | PRN
  Administered 2022-12-02: 9 mL via INTRAVENOUS

## 2022-12-02 MED ORDER — HYDROMORPHONE HCL 1 MG/ML IJ SOLN: 0.5000 mg | Freq: Once | INTRAMUSCULAR | Status: DC

## 2022-12-02 MED ORDER — SODIUM CHLORIDE 0.9 % IV SOLN: Freq: Once | INTRAVENOUS | Status: AC

## 2022-12-02 MED ORDER — ONDANSETRON HCL 4 MG/2ML IJ SOLN
4.0000 mg | Freq: Once | INTRAMUSCULAR | Status: AC
  Administered 2022-12-02: 4 mg via INTRAVENOUS
  Filled 2022-12-02: qty 2

## 2022-12-02 MED ORDER — HYDROCODONE-ACETAMINOPHEN 5-325 MG PO TABS
1.0000 | ORAL_TABLET | ORAL | Status: DC | PRN
  Administered 2022-12-02 – 2022-12-05 (×4): 1 via ORAL
  Filled 2022-12-02 (×4): qty 1

## 2022-12-02 MED ORDER — PIPERACILLIN-TAZOBACTAM 3.375 G IVPB
3.3750 g | Freq: Once | INTRAVENOUS | Status: AC
  Administered 2022-12-02: 3.375 g via INTRAVENOUS
  Filled 2022-12-02: qty 50

## 2022-12-02 MED ORDER — HYDROMORPHONE HCL 1 MG/ML IJ SOLN
0.5000 mg | INTRAMUSCULAR | Status: DC | PRN
  Administered 2022-12-03 – 2022-12-06 (×12): 0.5 mg via INTRAVENOUS
  Filled 2022-12-02 (×13): qty 0.5

## 2022-12-02 NOTE — ED Provider Notes (Signed)
[facility] Provider Note    Event Date/Time   First MD Initiated Contact with Patient 12/02/22 1320     (approximate)   History   Abdominal Pain and Chest Pain   HPI  Darryl Gilbert is a 67 y.o. male with history of ischemic cardiomyopathy, SVT on metoprolol presenting to the emergency department for evaluation of abdominal pain.  Pain is located in his epigastric region and right upper quadrant.  No associated nausea or vomiting.  Has a history of prior MI, unsure if this feels similar.  He received 324 of aspirin and 1 inch of nitroglycerin paste to his chest and route without significant improvement.      Physical Exam   Triage Vital Signs: ED Triage Vitals  Encounter Vitals Group     BP 12/02/22 1128 134/83     Systolic BP Percentile --      Diastolic BP Percentile --      Pulse Rate 12/02/22 1128 (!) 54     Resp 12/02/22 1128 (!) 22     Temp 12/02/22 1128 97.7 F (36.5 C)     Temp Source 12/02/22 1128 Oral     SpO2 12/02/22 1128 100 %     Weight 12/02/22 1133 200 lb (90.7 kg)     Height 12/02/22 1133 6' (1.829 m)     Head Circumference --      Peak Flow --      Pain Score 12/02/22 1132 10     Pain Loc --      Pain Education --      Exclude from Growth Chart --     Most recent vital signs: Vitals:   12/02/22 1128  BP: 134/83  Pulse: (!) 54  Resp: (!) 22  Temp: 97.7 F (36.5 C)  SpO2: 100%     General: Awake, interactive  CV:  Bradycardic with regular rhythm, normal peripheral perfusion Resp:  Lungs clear, unlabored respirations.  Abd:  Soft, nondistended, focally tender to palpation in the right upper quadrant and epigastric region, mild tenderness in the right lower quadrant, remainder of abdomen nontender Neuro:  Symmetric facial movement, fluid speech   ED Results / Procedures / Treatments   Labs (all labs ordered are listed, but only abnormal results are displayed) Labs Reviewed  BASIC METABOLIC PANEL - Abnormal;  Notable for the following components:      Result Value   Glucose, Bld 120 (*)    Creatinine, Ser 1.43 (*)    GFR, Estimated 54 (*)    All other components within normal limits  HEPATIC FUNCTION PANEL - Abnormal; Notable for the following components:   AST 119 (*)    ALT 85 (*)    Total Bilirubin 1.7 (*)    Bilirubin, Direct 0.6 (*)    Indirect Bilirubin 1.1 (*)    All other components within normal limits  CBC  LIPASE, BLOOD  TROPONIN I (HIGH SENSITIVITY)  TROPONIN I (HIGH SENSITIVITY)     EKG EKG independently reviewed interpreted by myself (ER attending) demonstrates:  EKG demonstrates sinus bradycardia rate of 57, PR 134, QRS 84, QTc 414, no acute ST changes  RADIOLOGY Imaging independently reviewed and interpreted by myself demonstrates:  Chest x- without focal consolidation Right upper quadrant ultrasound without obvious gallstones on my review, formal radiology read pending   PROCEDURES:  Critical Care performed: No  Procedures   MEDICATIONS ORDERED IN ED: Medications  HYDROmorphone (DILAUDID) injection 0.5 mg (0.5 mg Intravenous Given  12/02/22 1337)  iohexol (OMNIPAQUE) 300 MG/ML solution 100 mL (100 mLs Intravenous Contrast Given 12/02/22 1602)     IMPRESSION / MDM / ASSESSMENT AND PLAN / ED COURSE  I reviewed the triage vital signs and the nursing notes.  Differential diagnosis includes, but is not limited to, biliary pathology, pancreatitis, appendicitis, other acute intra-abdominal process, ACS, pneumonia, pneumothorax  Patient's presentation is most consistent with acute presentation with potential threat to life or bodily function.  67 year old male presenting with acute onset epigastric and right upper quadrant pain.  Uncomfortable appearing at time of my initial evaluation.  Ordered for Dilaudid for pain control.  Labs with stable creatinine elevation.  LFTs slightly elevated compared to earlier this year.  Right upper quadrant ultrasound ordered to  further evaluate.  Signed out to oncoming provider pending ultrasound, reevaluation, and disposition.  If having ongoing pain and ultrasound negative, may require CT for further evaluation.      FINAL CLINICAL IMPRESSION(S) / ED DIAGNOSES   Final diagnoses:  Epigastric pain     Rx / DC Orders   ED Discharge Orders     None        Note:  This document was prepared using Dragon voice recognition software and may include unintentional dictation errors.   Trinna Post, MD 12/02/22 [phone]

## 2022-12-02 NOTE — ED Triage Notes (Signed)
Pt c/o epigastric pain that started after 9am when he sat down at his desk. Pt reports pain is constant but intensifies in waves. Pt also c/o feeling light headed. Pt had MI with PCI in Sept 2022. EMS gave 324mg  ASA & 1in nitroglycerin paste to L chest en route. Breathing and movement tend to worsen the pain. Pt denies n/v. Pt denies alcohol or smoking.

## 2022-12-02 NOTE — ED Notes (Signed)
Assumed care of pt at this time. Pt is AAXO4, on CCM, VS stable and WNL. Pt states no active pain post medications. No needs identified at this time.

## 2022-12-02 NOTE — Consult Note (Signed)
PHARMACY -  BRIEF ANTIBIOTIC NOTE   Pharmacy has received consult(s) for intra-abdominal infection from an ED provider.  The patient's profile has been reviewed for ht/wt/allergies/indication/available labs.    One time order(s) placed for pip/tazo  Further antibiotics/pharmacy consults should be ordered by admitting physician if indicated.                       Thank you, Ronnald Ramp 12/02/2022  6:06 PM

## 2022-12-02 NOTE — ED Notes (Signed)
Pt updated on plan of care to get CT scan. Pt denies needs at this time

## 2022-12-02 NOTE — H&P (Signed)
History and Physical    Patient: Darryl Gilbert JXB:[id number] DOB: 1955-08-14 DOA: 12/02/2022 DOS: the patient was seen and examined on 12/03/2022 PCP: Dortha Kern, MD  Patient coming from: Home   Chief Complaint:  Chief Complaint  Patient presents with   Abdominal Pain   Chest Pain    HPI: Darryl Gilbert is a 67 y.o. male with medical history significant for heart disease, hypertension, history of tobacco abuse coming for epigastric abdominal pain radiating to both sides of the diaphragm that started this morning at rest. Patient at that time started to have nausea diaphoresis and shortness of breath he called his PCP who recommended to go to the emergency room.  Patient denies any palpitations bleeding speech or gait issues and no report of any other complaints.  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  The emergency room patient was also having right-sided abdominal pain right upper quadrant pain with positive Murphy's and general surgery was also consulted Dr. Aleen Campi who recommended obtaining an MRCP on the patient.  Continuing antibiotics on the patient and they will consult for patient tomorrow.Initial blood work shows abnormal creatinine of 1.43 EGFR of 54 abnormal LFTs with AST of 119 and ALT of 85 GGT 212 .  CBC is within normal limits. In the emergency room patient had a CT abdomen and pelvis which was abnormal showing haziness around the gallbladder with concerns for acute cholecystitis biliary dilatation.  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 D/W pt about infrarenal aneurysm and vascular referral from pcp once stable.  Review of Systems: Review of Systems  Constitutional:  Positive for diaphoresis.  Respiratory:  Positive for shortness of breath.   Gastrointestinal:  Positive for abdominal pain and nausea.  All other systems reviewed and are negative.   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    Hyperlipidemia LDL goal <70    Hypertension    Ischemic cardiomyopathy    Smoking    Past Surgical History:  Procedure Laterality Date   COLONOSCOPY WITH PROPOFOL N/A 05/07/2022   Procedure: COLONOSCOPY WITH PROPOFOL;  Surgeon: Wyline Mood, MD;  Location: [facility] ENDOSCOPY;  Service: Gastroenterology;  Laterality: N/A;   CORONARY/GRAFT ACUTE MI REVASCULARIZATION N/A 12/20/2020   Procedure: Coronary/Graft Acute MI Revascularization;  Surgeon: [name], MD;  Location: [facility] INVASIVE CV LAB;  Service: Cardiovascular;  Laterality: N/A;   FRACTURE SURGERY     Left leg fracture, rod placement   LEFT HEART CATH AND CORONARY ANGIOGRAPHY N/A 12/20/2020   Procedure: LEFT HEART CATH AND CORONARY ANGIOGRAPHY;  Surgeon: [name], MD;  Location: [facility] INVASIVE CV LAB;  Service: Cardiovascular;  Laterality: N/A;   LEFT HEART CATH AND CORONARY ANGIOGRAPHY N/A 02/23/2021   Procedure: LEFT HEART CATH AND CORONARY ANGIOGRAPHY;  Surgeon: [name], MD;  Location: [facility] INVASIVE CV LAB;  Service: Cardiovascular;  Laterality: N/A;   Social History:   reports that he quit smoking about 1 years ago. His smoking use included cigarettes. He started smoking about 46 years ago. He has a 67.5 pack-year smoking history. He has never used smokeless tobacco. He reports that he does not currently use alcohol.  He reports that he does not use drugs.  Allergies  Allergen Reactions   Amiodarone Other (See Comments)    QT prolongation in the context of inferior STEMI and IV amiodarone use. Avoid all QT prolonging medications.     No family history on file.  Prior to Admission medications   Medication Sig Start Date End Date Taking? Authorizing Provider  aspirin 81 MG chewable tablet Chew 1 tablet (81 mg total) by mouth daily. 12/26/20   Glade Lloyd, MD  atorvastatin (LIPITOR) 80 MG tablet Take 1 tablet (80 mg total) by mouth daily. 01/20/22   Lanier Prude, MD  buPROPion (WELLBUTRIN XL) 300 MG 24 hr tablet Take 300 mg by mouth daily. 01/20/22   [provider]  clopidogrel (PLAVIX) 75 MG tablet Take 1 tablet (75 mg total) by mouth daily. 07/09/22   [name], MD  esomeprazole (NEXIUM) 20 MG capsule Take 1 capsule (20 mg total) by mouth daily at 12 noon. 12/31/20   Dunn, Raymon Mutton, PA-C  furosemide (LASIX) 20 MG tablet Take 1 tablet (20 mg total) by mouth daily. 10/07/22   [name], MD  HYDROcodone-acetaminophen (NORCO) 10-325 MG tablet Take 1 tablet by mouth 3 (three) times daily as needed. 12/25/21   [provider]  metoprolol succinate (TOPROL XL) 25 MG 24 hr tablet Take 0.5 tablets (12.5 mg total) by mouth at bedtime. 11/09/22   Sherie Don, NP  nitroGLYCERIN (NITROSTAT) 0.4 MG SL tablet Place 1 tablet (0.4 mg total) under the tongue every 5 (five) minutes as needed for chest pain. 04/08/22   Dunn, Raymon Mutton, PA-C  potassium chloride (KLOR-CON) 10 MEQ tablet Take 1 tablet (10 mEq total) by mouth daily. 08/11/22   Sherie Don, NP  sacubitril-valsartan (ENTRESTO) 24-26 MG Take 1 tablet by mouth 2 (two) times daily. 03/04/22   [name], MD  tamsulosin (FLOMAX) 0.4 MG CAPS capsule Take 0.4 mg by mouth daily.    [provider]  umeclidinium-vilanterol (ANORO ELLIPTA) 62.5-25 MCG/ACT AEPB Inhale 1 puff into the lungs daily. 10/15/22 10/15/23  Raechel Chute, MD     Vitals:   12/02/22 2013 12/02/22 2014 12/02/22 2014 12/02/22 2050  BP:  118/66  (!) 135/99  Pulse: (!) 51   (!) 56  Resp: 15 16  18   Temp:   97.8 F (36.6 C) (!) 97.5 F (36.4 C)  TempSrc:   Oral Oral  SpO2: 96% 96%  100%  Weight:    92.3 kg  Height:    6' (1.829 m)   Physical Exam Vitals and nursing note reviewed.  Constitutional:      General: He is not in acute distress.    Appearance: He is obese.  HENT:     Head: Normocephalic and atraumatic.     Right Ear: Hearing normal.     Left Ear: Hearing normal.     Nose: Nose normal. No nasal deformity.     Mouth/Throat:     Lips: Pink.     Tongue: No lesions.     Pharynx: Oropharynx is clear.  Eyes:     General: Lids are normal.     Extraocular Movements: Extraocular movements intact.  Cardiovascular:     Rate and Rhythm: Regular rhythm. Bradycardia present.     Heart sounds: Normal heart sounds.  Pulmonary:     Effort: Pulmonary effort is normal.     Breath sounds: Normal breath sounds.  Abdominal:     General: Bowel sounds are normal. There is no distension.     Palpations: Abdomen is soft. There is no mass.     Tenderness: There is no abdominal tenderness.  Musculoskeletal:     Right lower leg: No edema.     Left lower leg: No edema.  Skin:    General: Skin is warm.  Neurological:     General: No focal deficit present.     Mental Status: He is alert and oriented to person, place, and time.     Cranial Nerves: Cranial nerves 2-12 are intact.  Psychiatric:        Attention and Perception: Attention normal.        Mood and Affect: Mood normal.        Speech: Speech normal.        Behavior: Behavior normal. Behavior is cooperative.     Labs on Admission: I have personally reviewed following labs and imaging studies  CBC: Recent Labs  Lab 12/02/22 1134  WBC 6.4  HGB 14.9  HCT 44.7  MCV 92.2  PLT 209   Basic Metabolic Panel: Recent Labs  Lab 12/02/22 1134  NA 137  K 3.9  CL 103   CO2 24  GLUCOSE 120*  BUN 18  CREATININE 1.43*  CALCIUM 9.5   GFR: Estimated Creatinine Clearance: 55.8 mL/min (A) (by C-G formula based on SCr of 1.43 mg/dL (H)). Liver Function Tests: Recent Labs  Lab 12/02/22 1333  AST 119*  ALT 85*  ALKPHOS 107  BILITOT 1.7*  PROT 7.8  ALBUMIN 4.2   Recent Labs  Lab 12/02/22 1333  LIPASE 44   No results for input(s): "AMMONIA" in the last 168 hours. Coagulation Profile: No results for input(s): "INR", "PROTIME" in the last 168 hours. Cardiac Enzymes: No results for input(s): "CKTOTAL", "CKMB", "CKMBINDEX", "TROPONINI" in the last 168 hours. BNP (last 3 results) No results for input(s): "PROBNP" in the last 8760 hours. HbA1C: No results for input(s): "HGBA1C" in the last 72 hours. CBG: No results for input(s): "GLUCAP" in the last 168 hours. Lipid Profile: No results for input(s): "CHOL", "HDL", "LDLCALC", "TRIG", "CHOLHDL", "LDLDIRECT" in the last 72 hours. Thyroid Function Tests: No results for input(s): "TSH", "T4TOTAL", "FREET4", "T3FREE", "THYROIDAB" in the last 72 hours. Anemia Panel: No results for input(s): "VITAMINB12", "FOLATE", "FERRITIN", "TIBC", "IRON", "RETICCTPCT" in the last 72 hours. Urinalysis No results found for: "COLORURINE", "APPEARANCEUR", "LABSPEC", "PHURINE", "GLUCOSEU", "HGBUR", "BILIRUBINUR", "KETONESUR", "PROTEINUR", "UROBILINOGEN", "NITRITE", "LEUKOCYTESUR"    Unresulted Labs (From admission, onward)     Start     Ordered   12/03/22 0501  HIV Antibody (routine testing w rflx)  Once,   R        12/03/22 0501   12/03/22 0500  Comprehensive metabolic panel  Tomorrow morning,   R        12/02/22 1938   12/03/22 0500  CBC  Tomorrow morning,   R        12/02/22 1938            Medications  piperacillin-tazobactam (ZOSYN) IVPB 3.375 g (has no administration in time range)  HYDROmorphone (DILAUDID) injection 0.5 mg (has no administration in time range)  heparin injection 5,000 Units (5,000  Units Subcutaneous Given 12/02/22 2251)  sodium chloride flush (NS) 0.9 % injection 3 mL (3 mLs Intravenous Given 12/02/22 2250)  0.9 %  sodium chloride infusion ( Intravenous New Bag/Given 12/02/22 2250)  acetaminophen (TYLENOL) tablet 650 mg (has no administration in time range)    Or  acetaminophen (TYLENOL) suppository 650 mg (has no administration in time range)  HYDROcodone-acetaminophen (NORCO/VICODIN) 5-325 MG per tablet 1 tablet (1 tablet Oral Given 12/02/22 2240)  morphine (PF) 2 MG/ML injection 2 mg (2 mg Intravenous Given 12/03/22 0019)  aspirin chewable tablet 81 mg (has no administration in time range)  atorvastatin (LIPITOR) tablet 80 mg (has no administration in time range)  buPROPion (WELLBUTRIN XL) 24 hr tablet 300 mg (has no administration in time range)  clopidogrel (PLAVIX) tablet 75 mg (has no administration in time range)  metoprolol succinate (TOPROL-XL) 24 hr tablet 12.5 mg (has no administration in time range)  umeclidinium-vilanterol (ANORO ELLIPTA) 62.5-25 MCG/ACT 1 puff (has no administration in time range)  HYDROmorphone (DILAUDID) injection 0.5 mg (0.5 mg Intravenous Given 12/02/22 1337)  iohexol (OMNIPAQUE) 300 MG/ML solution 100 mL (100 mLs Intravenous Contrast Given 12/02/22 1602)  HYDROmorphone (DILAUDID) injection 1 mg (1 mg Intravenous Given 12/02/22 1821)  ondansetron (ZOFRAN) injection 4 mg (4 mg Intravenous Given 12/02/22 1821)  0.9 %  sodium chloride infusion (0 mLs Intravenous Stopped 12/02/22 2027)  piperacillin-tazobactam (ZOSYN) IVPB 3.375 g (0 g Intravenous Stopped 12/02/22 2215)  pantoprazole (PROTONIX) injection 40 mg (40 mg Intravenous Given 12/02/22 1821)  gadobutrol (GADAVIST) 1 MMOL/ML injection 9 mL (9 mLs Intravenous Contrast Given 12/02/22 2157)  melatonin tablet 5 mg (5 mg Oral Given 12/03/22 0107)    Radiological Exams on Admission: CT ABDOMEN PELVIS W CONTRAST  Result Date: 12/02/2022 CLINICAL DATA:  Abdomen pain epigastric pain lightheaded  EXAM: CT ABDOMEN AND PELVIS WITH CONTRAST TECHNIQUE: Multidetector CT imaging of the abdomen and pelvis was performed using the standard protocol following bolus administration of intravenous contrast. RADIATION DOSE REDUCTION: This exam was performed according to the departmental dose-optimization program which includes automated exposure control, adjustment of the mA and/or kV according to patient size and/or use of iterative reconstruction technique. CONTRAST:  OMNIPAQUE IOHEXOL 300 MG/ML  SOLN COMPARISON:  Ultrasound 12/02/2022 FINDINGS: Lower chest: Lung bases demonstrate dependent atelectasis. Hepatobiliary: Subcentimeter hypodensities in the liver too small to further characterize. No biliary dilatation. Probable gallbladder folds at the fundus. Haziness around the gallbladder, for example series 5, image 33. Pancreas: Unremarkable. No pancreatic ductal dilatation or surrounding inflammatory changes. Spleen: Normal in size without focal abnormality. Adrenals/Urinary Tract: Adrenal glands are normal. No hydronephrosis. Renal cysts, no imaging follow-up is recommended. The bladder is normal Stomach/Bowel: The stomach is nonenlarged. No dilated small bowel. No acute bowel wall thickening. Negative appendix. Diverticular disease of the left colon. Vascular/Lymphatic: No suspicious lymph nodes. Mild aortic atherosclerosis. 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 Peripheral thrombus within the diffuse aneurysm. Reproductive: Slightly enlarged heterogeneous prostate Other: Negative for pelvic effusion or free air. Small fat containing umbilical hernia Musculoskeletal: Degenerative changes of the spine. No acute osseous abnormality. IMPRESSION: 1. Haziness around the gallbladder, without calcified stone or biliary dilatation. Acute cholecystitis not confirmed by previously performed  ultrasound. Correlation with nuclear medicine hepatobiliary imaging could be obtained if indicated. 2. Diverticular disease of the left colon without acute bowel wall thickening. 3. 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 Recommend follow-up every 12 months and vascular consultation. This recommendation follows ACR consensus guidelines: White Paper of the ACR Incidental Findings Committee II on Vascular Findings. J Am Coll Radiol 2013; 16:109-604. 4. Aortic atherosclerosis. Aortic Atherosclerosis (ICD10-I70.0). Electronically Signed   By: Jasmine Pang M.D.   On: 12/02/2022 17:43   US Abdomen Limited RUQ (LIVER/GB)  Result Date: 12/02/2022 CLINICAL DATA:  RUQ pain EXAM: ULTRASOUND ABDOMEN LIMITED RIGHT UPPER QUADRANT COMPARISON:  None Available. FINDINGS: Gallbladder: Distended gallbladder. No shadowing stones. No wall thickening or adjacent fluid. Common bile duct: Diameter: 2 mm Liver: Small cystic area identified in the left hepatic lobe measuring 11 x 8 mm with a thin septa. Within normal limits in parenchymal echogenicity. Portal vein is patent on color Doppler imaging with normal direction of blood flow towards the liver. Other: None. IMPRESSION: Distended gallbladder.  No stones.  No ductal dilatation. Minimally complex left hepatic lobe cyst measuring 11 mm Electronically Signed   By: Karen Kays M.D.   On: 12/02/2022 16:00   DG Chest 2 View  Result Date: 12/02/2022 CLINICAL DATA:  Chest pain EXAM: CHEST - 2 VIEW COMPARISON:  X-ray 06/07/2022 FINDINGS: The heart size and mediastinal contours are within normal limits. No consolidation, pneumothorax or effusion. No edema. Chronic lung changes identified. The visualized skeletal structures are unremarkable. Degenerative changes seen along the spine. IMPRESSION: Hyperinflation with chronic lung changes. No acute cardiopulmonary disease. Electronically Signed   By: Karen Kays M.D.   On: 12/02/2022 13:16     Data  Reviewed: Relevant notes from primary care and specialist visits, past discharge summaries as available in EHR, including Care Everywhere. Prior diagnostic testing as pertinent to current admission diagnoses Updated medications and problem lists for reconciliation ED course, including vitals, labs, imaging, treatment and response to treatment Triage notes, nursing and pharmacy notes and ED provider's notes Notable results as noted in HPI  Assessment and Plan: * Abdominal pain D/D include Cholecystitis / cholangitis . General surgery on board MRCP.   Coronary artery disease involving native coronary artery of native heart with angina pectoris (HCC) Cont asa / atorvastatin / metoprolol .   Paroxysmal atrial fibrillation (HCC) Currently patient's sinus bradycardic we will continue patient on aspirin 81, Lipitor, metoprolol.   DVT prophylaxis:  Heparin   Consults:  None   Advance Care Planning:    Code Status: Full Code   Family Communication:  None   Disposition Plan:  Home   Severity of Illness: The appropriate patient status for this patient is INPATIENT. Inpatient status is judged to be reasonable and necessary in order to provide the required intensity of service to ensure the patient's safety. The patient's presenting symptoms, physical exam findings, and initial radiographic and laboratory data in the context of their chronic comorbidities is felt to place them at high risk for further clinical deterioration. Furthermore, it is not anticipated that the patient will be medically stable for discharge from the hospital within 2 midnights of admission.   * I certify that at the point of admission it is my clinical judgment that the patient will require inpatient hospital care spanning beyond 2 midnights from the point of admission due to high intensity of service, high risk for further deterioration and high frequency of surveillance required.*  Author: Gertha Calkin,  MD 12/03/2022 3:23 AM  For on call review [email or web address].

## 2022-12-02 NOTE — ED Notes (Signed)
Pt back from CT

## 2022-12-02 NOTE — Progress Notes (Signed)
12/02/22  Case discussed with Dr. Erma Heritage.  Patient presents with epigastric and RUQ pain.  U/S showed only a distended gallbladder, but no cholelithiasis or inflammatory changes.  CT abdomen/pelvis shows some mild stranding around gallbladder.  His total bili is mildly elevated, as well as his AST/ALT.  Nuclear medicine not available at this time.  Recommended MRCP to evaluate for any potential choledocholithiasis and to help [place] better evaluate for inflammatory changes and cholecystitis.  Given that he is on both Aspirin and Plavix, would hold off on cholecystectomy at this point.  Recommend NPO diet, IV antibiotics.  If no improvement tomorrow, would discuss with IR about percutaneous cholecystostomy drain placement if indeed cholecystitis is confirmed with MRCP.  Full consult note to follow in AM.  Henrene Dodge, MD

## 2022-12-02 NOTE — ED Provider Notes (Signed)
On serial exams, the patient has ongoing, severe, right upper quadrant abdominal pain.  I discussed the case with general surgery Dr. Aleen Campi.  He recommends obtaining MRI and treating medically at this time, as patient is on multiple anticoagulants and would need washout prior to any surgical intervention.  Given the patient's ongoing pain, will admit to medicine for further workup and symptom control.  Zosyn ordered.   Shaune Pollack, MD 12/02/22 [phone]

## 2022-12-02 NOTE — Hospital Course (Signed)
MRCP

## 2022-12-03 ENCOUNTER — Encounter: Payer: Self-pay | Admitting: Internal Medicine

## 2022-12-03 DIAGNOSIS — K810 Acute cholecystitis: Secondary | ICD-10-CM

## 2022-12-03 DIAGNOSIS — R1011 Right upper quadrant pain: Secondary | ICD-10-CM | POA: Diagnosis not present

## 2022-12-03 LAB — CBC
HCT: 40.3 % (ref 39.0–52.0)
Hemoglobin: 13.6 g/dL (ref 13.0–17.0)
MCH: 31.2 pg (ref 26.0–34.0)
MCHC: 33.7 g/dL (ref 30.0–36.0)
MCV: 92.4 fL (ref 80.0–100.0)
Platelets: 163 10*3/uL (ref 150–400)
RBC: 4.36 MIL/uL (ref 4.22–5.81)
RDW: 13.5 % (ref 11.5–15.5)
WBC: 4.6 10*3/uL (ref 4.0–10.5)
nRBC: 0 % (ref 0.0–0.2)

## 2022-12-03 LAB — COMPREHENSIVE METABOLIC PANEL
ALT: 164 U/L — ABNORMAL HIGH (ref 0–44)
AST: 109 U/L — ABNORMAL HIGH (ref 15–41)
Albumin: 3.4 g/dL — ABNORMAL LOW (ref 3.5–5.0)
Alkaline Phosphatase: 102 U/L (ref 38–126)
Anion gap: 7 (ref 5–15)
BUN: 16 mg/dL (ref 8–23)
CO2: 23 mmol/L (ref 22–32)
Calcium: 8.7 mg/dL — ABNORMAL LOW (ref 8.9–10.3)
Chloride: 109 mmol/L (ref 98–111)
Creatinine, Ser: 1.35 mg/dL — ABNORMAL HIGH (ref 0.61–1.24)
GFR, Estimated: 58 mL/min — ABNORMAL LOW (ref >60)
Glucose, Bld: 97 mg/dL (ref 70–99)
Potassium: 4.2 mmol/L (ref 3.5–5.1)
Sodium: 139 mmol/L (ref 135–145)
Total Bilirubin: 1.7 mg/dL — ABNORMAL HIGH (ref 0.3–1.2)
Total Protein: 6.4 g/dL — ABNORMAL LOW (ref 6.5–8.1)

## 2022-12-03 LAB — BILIRUBIN, DIRECT: Bilirubin, Direct: 0.7 mg/dL — ABNORMAL HIGH (ref 0.0–0.2)

## 2022-12-03 LAB — HIV ANTIBODY (ROUTINE TESTING W REFLEX): HIV Screen 4th Generation wRfx: NONREACTIVE

## 2022-12-03 MED ORDER — CLOPIDOGREL BISULFATE 75 MG PO TABS
75.0000 mg | ORAL_TABLET | Freq: Every day | ORAL | Status: DC
  Administered 2022-12-03 – 2022-12-07 (×5): 75 mg via ORAL
  Filled 2022-12-03 (×5): qty 1

## 2022-12-03 MED ORDER — MELATONIN 5 MG PO TABS
5.0000 mg | ORAL_TABLET | Freq: Once | ORAL | Status: AC
  Administered 2022-12-03: 5 mg via ORAL
  Filled 2022-12-03: qty 1

## 2022-12-03 MED ORDER — BUPROPION HCL ER (XL) 150 MG PO TB24
300.0000 mg | ORAL_TABLET | Freq: Every day | ORAL | Status: DC
  Administered 2022-12-03 – 2022-12-07 (×5): 300 mg via ORAL
  Filled 2022-12-03 (×5): qty 2

## 2022-12-03 MED ORDER — PANTOPRAZOLE SODIUM 40 MG PO TBEC
40.0000 mg | DELAYED_RELEASE_TABLET | Freq: Every day | ORAL | Status: DC
  Administered 2022-12-03 – 2022-12-07 (×5): 40 mg via ORAL
  Filled 2022-12-03 (×5): qty 1

## 2022-12-03 MED ORDER — ENOXAPARIN SODIUM 40 MG/0.4ML IJ SOSY
40.0000 mg | PREFILLED_SYRINGE | Freq: Every evening | INTRAMUSCULAR | Status: DC
  Administered 2022-12-04 – 2022-12-06 (×3): 40 mg via SUBCUTANEOUS
  Filled 2022-12-03 (×3): qty 0.4

## 2022-12-03 MED ORDER — ATORVASTATIN CALCIUM 20 MG PO TABS
80.0000 mg | ORAL_TABLET | Freq: Every day | ORAL | Status: DC
  Administered 2022-12-03: 80 mg via ORAL
  Filled 2022-12-03: qty 4

## 2022-12-03 MED ORDER — METOPROLOL SUCCINATE ER 25 MG PO TB24: 12.5000 mg | ORAL_TABLET | Freq: Every day | ORAL | Status: DC

## 2022-12-03 MED ORDER — ASPIRIN 81 MG PO CHEW
81.0000 mg | CHEWABLE_TABLET | Freq: Every day | ORAL | Status: DC
  Administered 2022-12-03 – 2022-12-07 (×5): 81 mg via ORAL
  Filled 2022-12-03 (×5): qty 1

## 2022-12-03 MED ORDER — SODIUM CHLORIDE 0.9 % IV SOLN: INTRAVENOUS | Status: DC | PRN

## 2022-12-03 MED ORDER — UMECLIDINIUM-VILANTEROL 62.5-25 MCG/ACT IN AEPB
1.0000 | INHALATION_SPRAY | Freq: Every day | RESPIRATORY_TRACT | Status: DC
  Administered 2022-12-03 – 2022-12-07 (×5): 1 via RESPIRATORY_TRACT
  Filled 2022-12-03: qty 14

## 2022-12-03 NOTE — Progress Notes (Signed)
[facility] Progress Note  Patient: Darryl Gilbert    ZOX:[id number]  DOA: 12/02/2022     Date of Service: the patient was seen and examined on 12/03/2022  Chief Complaint  Patient presents with   Abdominal Pain   Chest Pain   Brief hospital course:  KENDERRICK MUSARRA is a 67 y.o. male with PMH of CAD, HTN, CHF, paroxysmal A-fib, cigarette smoking, as reviewed from EMR, presented at [facility] ED with complaining of epigastric abdominal pain radiating to both sides of diaphragm, started in the morning at rest.  Patient had nausea, diaphoresis and shortness of breath.  He called PCP and was recommended to go to ED. ED workup: Bradycardia heart rate 52, tachypneic respiratory 22, BP stable, saturating well on room air.  Creatinine 1.35, slightly elevated, transaminitis, elevated bilirubin as well. Troponin negative, WBC within normal range.  No anemia.  CT A/P: 1. Haziness around the gallbladder, without calcified stone or biliary dilatation. Acute cholecystitis not confirmed by previously performed ultrasound. Correlation with nuclear medicine hepatobiliary imaging could be obtained if indicated. 2. Diverticular disease of the left colon without acute bowel wall thickening. 3. Diffuse aneurysmal dilatation of the infrarenal abdominal aorta up to 4.1 cm. Recommend follow-up every 12 months and vascular consultation. This recommendation follows ACR consensus guidelines: White Paper of the ACR Incidental Findings Committee II on Vascular Findings. J Am Coll Radiol 2013; 81:191-478. 4. Aortic atherosclerosis.  MRCP: Reviewed as below  General surgery was consulted, [facility] consulted for admission and further management as below.  Assessment and Plan:  # Acute cholecystitis  Presented with abdominal pain Trend LFTs Continue IV antibiotics Zosyn Started clear liquid diet Continue as needed medication for pain control General Surgery consulted, reviewed MRCP, patient is not a candidate for ERCP.   Continue conservative management for now, gallbladder is contracted so cannot do cholecystostomy by IR at this time.  Patient will need definitive treatment as an outpatient in 2 months Patient will need cardiac clearance to stop antiplatelet therapy before surgical intervention.   # CAD, CHF and hypertension Continue aspirin and Plavix Discontinue metoprolol secondary to bradycardia  Held Lipitor secondary to transaminitis    # Paroxysmal atrial fibrillation, not on [facility] Currently patient's sinus bradycardic  continue aspirin 81, and Plavix 75 mg Discontinue metoprolol secondary to bradycardia Monitor heart rate and treat accordingly.  # COPD, continue Anora Ellipta inhaler # Depression, continue Wellbutrin  # Abdominal aortic aneurysm, infrarenal, 4.1 cm. Patient was advised to follow-up with vascular surgery as an outpatient.  Body mass index is 27.57 kg/m.  Interventions:  Diet: CLD DVT Prophylaxis: Subcutaneous Lovenox   Advance goals of care discussion: Full code  Family Communication: family was not present at bedside, at the time of interview.  The pt provided permission to discuss medical plan with the family. Opportunity was given to ask question and all questions were answered satisfactorily.   Disposition:  Pt is from Home, admitted with acute cholecystitis, still has abdominal pain, abnormal labs and on clear liquid diet, which precludes a safe discharge. Discharge to home, when cleared by general surgery, may need few days to stabilize.  Subjective: No significant events overnight, patient still has pain in the epigastric and right upper quadrant.  Pain was 10/10 before pain medication, currently 5/10 after pain meds.  Denies any nausea vomiting, no any other active issues.  Physical Exam: General: NAD, lying comfortably Appear in no distress, affect appropriate Eyes: PERRLA ENT: Oral Mucosa Clear, moist  Neck: no  JVD,  Cardiovascular: S1 and S2 Present, no  Murmur,  Respiratory: good respiratory effort, Bilateral Air entry equal and Decreased, no Crackles, no wheezes Abdomen: Bowel Sound present, Soft, epigastric and RUQ  tenderness,  Skin: no rashes Extremities: no Pedal edema, no calf tenderness Neurologic: without any new focal findings Gait not checked due to patient safety concerns  Vitals:   12/02/22 2050 12/03/22 0424 12/03/22 0500 12/03/22 0744  BP: (!) 135/99 96/74  117/72  Pulse: (!) 56 (!) 56  (!) 53  Resp: 18 18  18   Temp: (!) 97.5 F (36.4 C) 98.1 F (36.7 C)  98.2 F (36.8 C)  TempSrc: Oral Oral  Oral  SpO2: 100% 95%  96%  Weight: 92.3 kg  92.2 kg   Height: 6' (1.829 m)       Intake/Output Summary (Last 24 hours) at 12/03/2022 1615 Last data filed at 12/03/2022 1600 Gross per 24 hour  Intake 409.22 ml  Output 500 ml  Net -90.78 ml   Filed Weights   12/02/22 1133 12/02/22 2050 12/03/22 0500  Weight: 90.7 kg 92.3 kg 92.2 kg    Data Reviewed: I have personally reviewed and interpreted daily labs, tele strips, imagings as discussed above. I reviewed all nursing notes, pharmacy notes, vitals, pertinent old records I have discussed plan of care as described above with RN and patient/family.  CBC: Recent Labs  Lab 12/02/22 1134 12/03/22 0657  WBC 6.4 4.6  HGB 14.9 13.6  HCT 44.7 40.3  MCV 92.2 92.4  PLT 209 163   Basic Metabolic Panel: Recent Labs  Lab 12/02/22 1134 12/03/22 0657  NA 137 139  K 3.9 4.2  CL 103 109  CO2 24 23  GLUCOSE 120* 97  BUN 18 16  CREATININE 1.43* 1.35*  CALCIUM 9.5 8.7*    Studies: MR ABDOMEN MRCP W WO CONTAST  Result Date: 12/03/2022 CLINICAL DATA:  Ongoing right upper quadrant abdominal pain, cholelithiasis. EXAM: MRI ABDOMEN WITHOUT AND WITH CONTRAST (INCLUDING MRCP) TECHNIQUE: Multiplanar multisequence MR imaging of the abdomen was performed both before and after the administration of intravenous contrast. Heavily T2-weighted images of the biliary and pancreatic ducts  were obtained, and three-dimensional MRCP images were rendered by post processing. Post-processing was applied at the acquisition scanner with concurrent physician supervision which includes 3D reconstructions, MIPs, volume rendered images and/or shaded surface rendering. CONTRAST:  9mL GADAVIST GADOBUTROL 1 MMOL/ML IV SOLN COMPARISON:  CT scan 12/02/2022 FINDINGS: Lower chest: Dependent subsegmental atelectasis in the lower lobes. 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 The gallbladder demonstrates wall thickening but also contraction on today's examination, complicating assessment for cholecystitis. Accentuated signal intensity in the gallbladder lumen compatible with sludge. Mildly accentuated enhancement of the gallbladder mucosa diffusely. No gallstones observed. No biliary dilatation. On image 32 of series 18, question of a distal CBD stone is raised, but this is not confirmed on any other imaging sequences and given the degree of artifact caused by motion, this is more likely to be artifactual. There are few scattered nonenhancing cystic lesions in the liver, favoring cysts over biliary hamartomas. No worrisome enhancing hepatic parenchymal lesion. Pancreas:  Unremarkable Spleen:  Unremarkable Adrenals/Urinary Tract:  Adrenal glands appear normal. Simple cysts are present in both kidneys. No further imaging workup of these lesions is indicated. Stomach/Bowel: Unremarkable Vascular/Lymphatic: Infrarenal abdominal aortic aneurysm 4.0 cm in diameter. Other:  No supplemental non-categorized findings. Musculoskeletal: Dextroconvex lumbar scoliosis with lower thoracic and lumbar spondylosis and degenerative disc  disease. IMPRESSION: 1. 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 The gallbladder is contracted, leading to wall thickening making it difficult to assess for inflammation. There is a suggestion of some gallbladder wall  thickening/edema but much of this could be secondary to contraction. There is sludge in the gallbladder without visualized gallstones. 2. No biliary dilatation. Question of a distal CBD stone on a single image, but this is not confirmed on any other imaging sequences and given the degree of artifact caused by motion, this is more likely to be artifactual. 3. Infrarenal abdominal aortic aneurysm 4.0 cm in diameter. Recommend follow-up every 12 months and vascular consultation. This recommendation follows ACR consensus guidelines: White Paper of the ACR Incidental Findings Committee II on Vascular Findings. J Am Coll Radiol 2013; 82:956-213. 4. Dextroconvex lumbar scoliosis with lower thoracic and lumbar spondylosis and degenerative disc disease. 5. Dependent subsegmental atelectasis in the lower lobes. Electronically Signed   By: Gaylyn Rong M.D.   On: 12/03/2022 06:56   MR 3D Recon At Scanner  Result Date: 12/03/2022 CLINICAL DATA:  Ongoing right upper quadrant abdominal pain, cholelithiasis. EXAM: MRI ABDOMEN WITHOUT AND WITH CONTRAST (INCLUDING MRCP) TECHNIQUE: Multiplanar multisequence MR imaging of the abdomen was performed both before and after the administration of intravenous contrast. Heavily T2-weighted images of the biliary and pancreatic ducts were obtained, and three-dimensional MRCP images were rendered by post processing. Post-processing was applied at the acquisition scanner with concurrent physician supervision which includes 3D reconstructions, MIPs, volume rendered images and/or shaded surface rendering. CONTRAST:  9mL GADAVIST GADOBUTROL 1 MMOL/ML IV SOLN COMPARISON:  CT scan 12/02/2022 FINDINGS: Lower chest: Dependent subsegmental atelectasis in the lower lobes. 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 The gallbladder demonstrates wall thickening but also contraction on today's examination, complicating assessment for cholecystitis.  Accentuated signal intensity in the gallbladder lumen compatible with sludge. Mildly accentuated enhancement of the gallbladder mucosa diffusely. No gallstones observed. No biliary dilatation. On image 32 of series 18, question of a distal CBD stone is raised, but this is not confirmed on any other imaging sequences and given the degree of artifact caused by motion, this is more likely to be artifactual. There are few scattered nonenhancing cystic lesions in the liver, favoring cysts over biliary hamartomas. No worrisome enhancing hepatic parenchymal lesion. Pancreas:  Unremarkable Spleen:  Unremarkable Adrenals/Urinary Tract:  Adrenal glands appear normal. Simple cysts are present in both kidneys. No further imaging workup of these lesions is indicated. Stomach/Bowel: Unremarkable Vascular/Lymphatic: Infrarenal abdominal aortic aneurysm 4.0 cm in diameter. Other:  No supplemental non-categorized findings. Musculoskeletal: Dextroconvex lumbar scoliosis with lower thoracic and lumbar spondylosis and degenerative disc disease. IMPRESSION: 1. 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 The gallbladder is contracted, leading to wall thickening making it difficult to assess for inflammation. There is a suggestion of some gallbladder wall thickening/edema but much of this could be secondary to contraction. There is sludge in the gallbladder without visualized gallstones. 2. No biliary dilatation. Question of a distal CBD stone on a single image, but this is not confirmed on any other imaging sequences and given the degree of artifact caused by motion, this is more likely to be artifactual. 3. Infrarenal abdominal aortic aneurysm 4.0 cm in diameter. Recommend follow-up every 12 months and vascular consultation. This recommendation follows ACR consensus guidelines: White Paper of the ACR Incidental Findings Committee II on Vascular Findings. J Am Coll Radiol 2013; 10:789-794. 4.  Dextroconvex  lumbar scoliosis with lower thoracic and lumbar spondylosis and degenerative disc disease. 5. Dependent subsegmental atelectasis in the lower lobes. Electronically Signed   By: Gaylyn Rong M.D.   On: 12/03/2022 06:56    Scheduled Meds:  aspirin  81 mg Oral Daily   atorvastatin  80 mg Oral Daily   buPROPion  300 mg Oral Daily   clopidogrel  75 mg Oral Daily   enoxaparin (LOVENOX) injection  40 mg Subcutaneous QPM   metoprolol succinate  12.5 mg Oral QHS   sodium chloride flush  3 mL Intravenous Q12H   umeclidinium-vilanterol  1 puff Inhalation Daily   Continuous Infusions:  sodium chloride 50 mL/hr at 12/02/22 2250   piperacillin-tazobactam (ZOSYN)  IV 3.375 g (12/03/22 1524)   PRN Meds: acetaminophen **OR** acetaminophen, HYDROcodone-acetaminophen, HYDROmorphone (DILAUDID) injection  Time spent: 35 minutes  Author: Gillis Santa. MD [facility] 12/03/2022 4:15 PM  To reach On-call, see care teams to locate the attending and reach out to them via [email or web address]. If 7PM-7AM, please contact night-coverage If you still have difficulty reaching the attending provider, please page the [facility] (Director on Call) for [facility] on amion for assistance.

## 2022-12-03 NOTE — Plan of Care (Signed)

## 2022-12-03 NOTE — Consult Note (Signed)
[facility] SURGICAL CONSULTATION NOTE (initial) - cpt: 02725   HISTORY OF PRESENT ILLNESS (HPI):  67 y.o. male presented to [facility] ED yesterday for evaluation of abdominal pain. Patient reports the acute onset of sudden epigastric and RUQ abdominal pain at work in the morning. He was sitting at his desk when this started. No associated fever, chills, nausea, emesis, bowel changes, urinary change, CP, or SOB. No history of similar in the past. He does have history of laparoscopic hernia repair x2. He does have a history of STEMI in the past and he is on Plavix and ASA. Work up in the ED revealed a normal WBC at 6.6K, Hgb to 14.9, sCr at baseline at 1.43, no significant electrolyte derangements, he was found to have hyperbilirubinemia to 1.7 (indirect 1.1), ALT 85, AST 119, high sensitivity troponin was normal x2. He did get RUQ [place] which showed a distended gallbladder but no evidence of cholelithiasis nor cholecystitis. CT Abdomen/Pelvis was also obtained and remained equivocal for cholecystitis. Given equivocal findings and LFT elevation, MRCP was obtained and showed gallbladder sludge, gallbladder now contracted, and there was question of 1 image concerning for possible CBD stone but was not clear cut. He is admitted to medicine service. Currently on Zosyn. He is NPO this morning.    Surgery is consulted by emergency medicine physician Dr. Shaune Pollack, MD in this context for evaluation and management of possible cholecystitis and LFT elevation.   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    Hyperlipidemia LDL goal <70    Hypertension    Ischemic cardiomyopathy    Smoking      PAST SURGICAL HISTORY (PSH):  Past Surgical History:  Procedure Laterality Date   COLONOSCOPY WITH PROPOFOL N/A  05/07/2022   Procedure: COLONOSCOPY WITH PROPOFOL;  Surgeon: Wyline Mood, MD;  Location: [facility] ENDOSCOPY;  Service: Gastroenterology;  Laterality: N/A;   CORONARY/GRAFT ACUTE MI REVASCULARIZATION N/A 12/20/2020   Procedure: Coronary/Graft Acute MI Revascularization;  Surgeon: [name], MD;  Location: [facility] INVASIVE CV LAB;  Service: Cardiovascular;  Laterality: N/A;   FRACTURE SURGERY     Left leg fracture, rod placement   LEFT HEART CATH AND CORONARY ANGIOGRAPHY N/A 12/20/2020   Procedure: LEFT HEART CATH AND CORONARY ANGIOGRAPHY;  Surgeon: [name], MD;  Location: [facility] INVASIVE CV LAB;  Service: Cardiovascular;  Laterality: N/A;   LEFT HEART CATH AND CORONARY ANGIOGRAPHY N/A 02/23/2021   Procedure: LEFT HEART CATH AND CORONARY ANGIOGRAPHY;  Surgeon: [name], MD;  Location: [facility] INVASIVE CV LAB;  Service: Cardiovascular;  Laterality: N/A;     MEDICATIONS:  Prior to Admission medications   Medication Sig Start Date End Date Taking? Authorizing Provider  aspirin 81 MG chewable tablet Chew 1 tablet (81 mg total) by mouth daily. 12/26/20   Glade Lloyd, MD  atorvastatin (LIPITOR) 80 MG tablet Take 1 tablet (80 mg total) by mouth daily. 01/20/22   Lanier Prude, MD  buPROPion (WELLBUTRIN XL) 300 MG 24 hr tablet Take 300 mg by mouth daily. 01/20/22   [provider]  clopidogrel (PLAVIX) 75 MG tablet Take 1 tablet (75 mg total) by mouth daily. 07/09/22   [name], MD  esomeprazole (NEXIUM) 20 MG capsule Take 1 capsule (20 mg total) by mouth daily at 12  noon. 12/31/20   Sondra Barges, PA-C  furosemide (LASIX) 20 MG tablet Take 1 tablet (20 mg total) by mouth daily. 10/07/22   [name], MD  HYDROcodone-acetaminophen (NORCO) 10-325 MG tablet Take 1 tablet by mouth 3 (three) times daily as needed. 12/25/21   [provider]  metoprolol succinate (TOPROL XL) 25 MG 24 hr tablet Take 0.5 tablets (12.5 mg total) by mouth at bedtime. 11/09/22   Sherie Don, NP  nitroGLYCERIN (NITROSTAT) 0.4 MG SL tablet Place 1 tablet (0.4 mg total) under the tongue every 5 (five) minutes as needed for chest pain. 04/08/22   Dunn, Raymon Mutton, PA-C  potassium chloride (KLOR-CON) 10 MEQ tablet Take 1 tablet (10 mEq total) by mouth daily. 08/11/22   Sherie Don, NP  sacubitril-valsartan (ENTRESTO) 24-26 MG Take 1 tablet by mouth 2 (two) times daily. 03/04/22   [name], MD  tamsulosin (FLOMAX) 0.4 MG CAPS capsule Take 0.4 mg by mouth daily.    [provider]  umeclidinium-vilanterol (ANORO ELLIPTA) 62.5-25 MCG/ACT AEPB Inhale 1 puff into the lungs daily. 10/15/22 10/15/23  Raechel Chute, MD     ALLERGIES:  Allergies  Allergen Reactions   Amiodarone Other (See Comments)    QT prolongation in the context of inferior STEMI and IV amiodarone use. Avoid all QT prolonging medications.      SOCIAL HISTORY:  Social History   Socioeconomic History   Marital status: Divorced    Spouse name: Not on file   Number of children: Not on file   Years of education: Not on file   Highest education level: Not on file  Occupational History   Not on file  Tobacco Use   Smoking status: Former    Current packs/day: 0.00    Average packs/day: 1.5 packs/day for 45.0 years (67.5 ttl pk-yrs)    Types: Cigarettes    Start date: 12/21/1975    Quit date: 12/20/2020    Years since quitting: 1.9   Smokeless tobacco: Never  Vaping Use   Vaping status: Never Used  Substance and Sexual Activity   Alcohol use: Not Currently   Drug use: Never   Sexual activity: Not on file  Other Topics Concern   Not on file  Social History Narrative   Not on file   Social Determinants of Health   Financial Resource Strain: Not on file  Food Insecurity: Not on file  Transportation Needs: Not on file  Physical Activity: Not on file  Stress: Not on file  Social Connections: Not on file  Intimate Partner Violence: Not on file     FAMILY HISTORY:  No family history on  file.    REVIEW OF SYSTEMS:  Review of Systems  Constitutional:  Negative for chills and fever.  Respiratory:  Negative for cough and shortness of breath.   Cardiovascular:  Negative for chest pain and palpitations.  Gastrointestinal:  Positive for abdominal pain. Negative for constipation, diarrhea, nausea and vomiting.  Genitourinary:  Negative for dysuria and urgency.  All other systems reviewed and are negative.   VITAL SIGNS:  Temp:  [97.5 F (36.4 C)-98.2 F (36.8 C)] 98.2 F (36.8 C) (08/16 0744) Pulse Rate:  [51-96] 53 (08/16 0744) Resp:  [15-22] 18 (08/16 0744) BP: (96-135)/(66-99) 117/72 (08/16 0744) SpO2:  [95 %-100 %] 96 % (08/16 0744) Weight:  [90.7 kg-92.3 kg] 92.2 kg (08/16 0500)     Height: 6' (182.9 cm) Weight: 92.2 kg BMI (Calculated): 27.56   INTAKE/OUTPUT:  08/15 0701 - 08/16 0700 In: 409.2 [I.V.:406.4; IV Piggyback:2.9] Out: -   PHYSICAL EXAM:  Physical Exam Vitals and nursing note reviewed. Exam conducted with a chaperone present.  Constitutional:      General: He is not in acute distress.    Appearance: He is well-developed and normal weight. He is not ill-appearing.     Comments: Patient resting in bed; NAD  HENT:     Head: Normocephalic and atraumatic.  Eyes:     General: No scleral icterus.    Extraocular Movements: Extraocular movements intact.  Cardiovascular:     Rate and Rhythm: Normal rate.     Heart sounds: Normal heart sounds. No murmur heard. Pulmonary:     Effort: Pulmonary effort is normal. No respiratory distress.     Breath sounds: Normal breath sounds.  Abdominal:     General: There is no distension.     Palpations: Abdomen is soft.     Tenderness: There is abdominal tenderness in the right upper quadrant. There is no guarding or rebound.     Comments: Abdomen is soft, he is tender in RUQ and mildly in epigastrium, non-distended, no rebound/guarding.  Genitourinary:    Comments: Deferred Skin:    General: Skin is warm and  dry.     Coloration: Skin is not jaundiced.  Neurological:     General: No focal deficit present.     Mental Status: He is alert and oriented to person, place, and time.  Psychiatric:        Mood and Affect: Mood normal.        Behavior: Behavior normal.      Labs:     Latest Ref Rng & Units 12/03/2022    6:57 AM 12/02/2022   11:34 AM 06/07/2022    3:36 PM  CBC  WBC 4.0 - 10.5 K/uL 4.6  6.4  4.1   Hemoglobin 13.0 - 17.0 g/dL 16.1  09.6  04.5   Hematocrit 39.0 - 52.0 % 40.3  44.7  43.3   Platelets 150 - 400 K/uL 163  209  183       Latest Ref Rng & Units 12/03/2022    6:57 AM 12/02/2022    1:33 PM 12/02/2022   11:34 AM  CMP  Glucose 70 - 99 mg/dL 97   409   BUN 8 - 23 mg/dL 16   18   Creatinine 8.11 - 1.24 mg/dL 9.14   7.82   Sodium 956 - 145 mmol/L 139   137   Potassium 3.5 - 5.1 mmol/L 4.2   3.9   Chloride 98 - 111 mmol/L 109   103   CO2 22 - 32 mmol/L 23   24   Calcium 8.9 - 10.3 mg/dL 8.7   9.5   Total Protein 6.5 - 8.1 g/dL 6.4  7.8    Total Bilirubin 0.3 - 1.2 mg/dL 1.7  1.7    Alkaline Phos 38 - 126 U/L 102  107    AST 15 - 41 U/L 109  119    ALT 0 - 44 U/L 164  85       Imaging studies:   RUQ [place] (12/02/2022) personally reviewed showing distension of gallbladder without obvious stone nor evidence of cholecystitis, and radiologist report reviewed below;  IMPRESSION: Distended gallbladder.  No stones.  No ductal dilatation.   Minimally complex left hepatic lobe cyst measuring 11 mm   CT Abdomen/Pelvis (12/02/2022) personally reviewed questionable changes about the gallbladder but again  no obvious cholelithiasis, and radiologist report reviewed below;  IMPRESSION: 1. Haziness around the gallbladder, without calcified stone or biliary dilatation. Acute cholecystitis not confirmed by previously performed ultrasound. Correlation with nuclear medicine hepatobiliary imaging could be obtained if indicated. 2. Diverticular disease of the left colon without acute  bowel wall thickening. 3. Diffuse aneurysmal dilatation of the infrarenal abdominal aorta up to 4.1 cm. Recommend follow-up every 12 months and vascular consultation. This recommendation follows ACR consensus guidelines: White Paper of the ACR Incidental Findings Committee II on Vascular Findings. J Am Coll Radiol 2013; 60:454-098. 4. Aortic atherosclerosis.   MRCP (12/02/2022) personally reviewed now showing contracted gallbladder, sludge, and there is question of possible CBD stone but only on 1 image, and radiologist report reviewed below;  IMPRESSION: 1. 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 The gallbladder is contracted, leading to wall thickening making it difficult to assess for inflammation. There is a suggestion of some gallbladder wall thickening/edema but much of this could be secondary to contraction. There is sludge in the gallbladder without visualized gallstones. 2. No biliary dilatation. Question of a distal CBD stone on a single image, but this is not confirmed on any other imaging sequences and given the degree of artifact caused by motion, this is more likely to be artifactual. 3. Infrarenal abdominal aortic aneurysm 4.0 cm in diameter. Recommend follow-up every 12 months and vascular consultation. This recommendation follows ACR consensus guidelines: White Paper of the ACR Incidental Findings Committee II on Vascular Findings. J Am Coll Radiol 2013; 11:914-782. 4. Dextroconvex lumbar scoliosis with lower thoracic and lumbar spondylosis and degenerative disc disease. 5. Dependent subsegmental atelectasis in the lower lobes.     Assessment/Plan: (ICD-10's: K81.0) 67 y.o. male with RUQ abdominal pain and LFT elevation with possible cholecystitis, complicated by history of STEMI and need for Plavix/ASA   - Although work up has been somewhat equivocal for cholecystitis, we will air on the side of caution and managed him as such.  Unfortunately, he has been on Plavix and ASA given his history of STEMI. This makes him a sub-optimal candidate for surgery at this time given bleeding risk. Given this, we will forgo surgery emergently. Should he clinically worsen, would need to consider IR evaluation for percutaneous cholecystostomy tube. Gallbladder is also contracted on most recent imaging, so this may not be feasible now and likely will require repeat US. We will continue to trial conservative management with IV Abx as long as he does well clinically.   - HIDA is another potential option for imaging, but likely does not change management course at this time.   - Follow up LFTs; If these worsen, consider GI evaluation for potential ERCP  - Okay for CLD  - Agree with IV Abx (Zosyn)  - Monitor abdominal examination; on-going bowel function  - Pain control prn; antiemetics prn  - Monitor LFTs; morning labs  - Okay to mobilize  - Further management per primary service; we will follow   All of the above findings and recommendations were discussed with the patient, and all of patient's questions were answered to his expressed satisfaction.  Thank you for the opportunity to participate in this patient's care.   -- Lynden Oxford, PA-C [facility] 12/03/2022, 7:51 AM M-F: 7am - 4pm

## 2022-12-03 NOTE — Assessment & Plan Note (Signed)
Currently patient's sinus bradycardic we will continue patient on aspirin 81, Lipitor, metoprolol.

## 2022-12-03 NOTE — Assessment & Plan Note (Signed)
Cont asa / atorvastatin / metoprolol .

## 2022-12-03 NOTE — Assessment & Plan Note (Addendum)
D/D include Cholecystitis / cholangitis . General surgery on board MRCP.  PRN tylenol. Do not suspect cardiac etiology.

## 2022-12-04 DIAGNOSIS — R1011 Right upper quadrant pain: Secondary | ICD-10-CM | POA: Diagnosis not present

## 2022-12-04 DIAGNOSIS — K810 Acute cholecystitis: Secondary | ICD-10-CM | POA: Diagnosis not present

## 2022-12-04 LAB — HEPATIC FUNCTION PANEL
ALT: 110 U/L — ABNORMAL HIGH (ref 0–44)
AST: 49 U/L — ABNORMAL HIGH (ref 15–41)
Albumin: 3.6 g/dL (ref 3.5–5.0)
Alkaline Phosphatase: 91 U/L (ref 38–126)
Bilirubin, Direct: 0.3 mg/dL — ABNORMAL HIGH (ref 0.0–0.2)
Indirect Bilirubin: 0.9 mg/dL (ref 0.3–0.9)
Total Bilirubin: 1.2 mg/dL (ref 0.3–1.2)
Total Protein: 6.6 g/dL (ref 6.5–8.1)

## 2022-12-04 LAB — MAGNESIUM: Magnesium: 2.1 mg/dL (ref 1.7–2.4)

## 2022-12-04 LAB — BASIC METABOLIC PANEL
Anion gap: 7 (ref 5–15)
BUN: 12 mg/dL (ref 8–23)
CO2: 23 mmol/L (ref 22–32)
Calcium: 8.9 mg/dL (ref 8.9–10.3)
Chloride: 108 mmol/L (ref 98–111)
Creatinine, Ser: 1.37 mg/dL — ABNORMAL HIGH (ref 0.61–1.24)
GFR, Estimated: 57 mL/min — ABNORMAL LOW (ref >60)
Glucose, Bld: 100 mg/dL — ABNORMAL HIGH (ref 70–99)
Potassium: 3.9 mmol/L (ref 3.5–5.1)
Sodium: 138 mmol/L (ref 135–145)

## 2022-12-04 LAB — CBC
HCT: 39 % (ref 39.0–52.0)
Hemoglobin: 13.2 g/dL (ref 13.0–17.0)
MCH: 30.4 pg (ref 26.0–34.0)
MCHC: 33.8 g/dL (ref 30.0–36.0)
MCV: 89.9 fL (ref 80.0–100.0)
Platelets: 164 10*3/uL (ref 150–400)
RBC: 4.34 MIL/uL (ref 4.22–5.81)
RDW: 13.4 % (ref 11.5–15.5)
WBC: 5.3 10*3/uL (ref 4.0–10.5)
nRBC: 0 % (ref 0.0–0.2)

## 2022-12-04 LAB — LIPASE, BLOOD: Lipase: 35 U/L (ref 11–51)

## 2022-12-04 LAB — PHOSPHORUS: Phosphorus: 3.5 mg/dL (ref 2.5–4.6)

## 2022-12-04 MED ORDER — ONDANSETRON HCL 4 MG/2ML IJ SOLN: 4.0000 mg | Freq: Four times a day (QID) | INTRAMUSCULAR | Status: DC | PRN

## 2022-12-04 NOTE — Progress Notes (Signed)
[facility] Progress Note  Patient: Darryl Gilbert    XBJ:[id number]  DOA: 12/02/2022     Date of Service: the patient was seen and examined on 12/04/2022  Chief Complaint  Patient presents with   Abdominal Pain   Chest Pain   Brief hospital course:  Darryl Gilbert is a 67 y.o. male with PMH of CAD, HTN, CHF, paroxysmal A-fib, cigarette smoking, as reviewed from EMR, presented at [facility] ED with complaining of epigastric abdominal pain radiating to both sides of diaphragm, started in the morning at rest.  Patient had nausea, diaphoresis and shortness of breath.  He called PCP and was recommended to go to ED. ED workup: Bradycardia heart rate 52, tachypneic respiratory 22, BP stable, saturating well on room air.  Creatinine 1.35, slightly elevated, transaminitis, elevated bilirubin as well. Troponin negative, WBC within normal range.  No anemia.  CT A/P: 1. Haziness around the gallbladder, without calcified stone or biliary dilatation. Acute cholecystitis not confirmed by previously performed ultrasound. Correlation with nuclear medicine hepatobiliary imaging could be obtained if indicated. 2. Diverticular disease of the left colon without acute bowel wall thickening. 3. Diffuse aneurysmal dilatation of the infrarenal abdominal aorta up to 4.1 cm. Recommend follow-up every 12 months and vascular consultation. This recommendation follows ACR consensus guidelines: White Paper of the ACR Incidental Findings Committee II on Vascular Findings. J Am Coll Radiol 2013; 30:865-784. 4. Aortic atherosclerosis.  MRCP: Reviewed as below  General surgery was consulted, [facility] consulted for admission and further management as below.  Assessment and Plan:  # Acute cholecystitis  Presented with abdominal pain Trend LFTs Continue IV antibiotics Zosyn Started clear liquid diet Continue as needed medication for pain control General Surgery consulted, reviewed MRCP, patient is not a candidate for ERCP.   Continue conservative management for now, gallbladder is contracted so cannot do cholecystostomy by IR at this time.  Patient will need definitive treatment as an outpatient in 2 months Patient will need cardiac clearance to stop antiplatelet therapy before surgical intervention. Repeat ultrasound and may do HIDA scan on Monday   # CAD, CHF and hypertension Continue aspirin and Plavix Discontinue metoprolol secondary to bradycardia  Held Lipitor secondary to transaminitis    # Paroxysmal atrial fibrillation, not on [facility] Currently patient's sinus bradycardic  continue aspirin 81, and Plavix 75 mg Discontinue metoprolol secondary to bradycardia Monitor heart rate and treat accordingly.  # COPD, continue Anora Ellipta inhaler # Depression, continue Wellbutrin  # Abdominal aortic aneurysm, infrarenal, 4.1 cm. Patient was advised to follow-up with vascular surgery as an outpatient.  Body mass index is 27.15 kg/m.  Interventions:  Diet: FLD DVT Prophylaxis: Subcutaneous Lovenox   Advance goals of care discussion: Full code  Family Communication: family was present at bedside, at the time of interview.  The pt provided permission to discuss medical plan with the family. Opportunity was given to ask question and all questions were answered satisfactorily.  8/17 discussed with patient's wife at bedside.   Disposition:  Pt is from Home, admitted with acute cholecystitis, still has abdominal pain, abnormal labs and on clear liquid diet, which precludes a safe discharge. Discharge to home, when cleared by general surgery, may need few days to stabilize.  Subjective: No significant events overnight, patient was having significant pain in the abdomen 9/10 after examination done by general surgery.  Patient took pain medication, it will take time to kick in.  We will continue monitor.  Patient was advised to go slow with  the diet. Patient denied any other complaints.   Physical  Exam: General: NAD, lying comfortably Appear in no distress, affect appropriate Eyes: PERRLA ENT: Oral Mucosa Clear, moist  Neck: no JVD,  Cardiovascular: S1 and S2 Present, no Murmur,  Respiratory: good respiratory effort, Bilateral Air entry equal and Decreased, no Crackles, no wheezes Abdomen: Bowel Sound present, Soft, epigastric and RUQ  tenderness,  Skin: no rashes Extremities: no Pedal edema, no calf tenderness Neurologic: without any new focal findings Gait not checked due to patient safety concerns  Vitals:   12/03/22 0744 12/03/22 2036 12/04/22 0415 12/04/22 0451  BP: 117/72 132/76 124/65   Pulse: (!) 53 70 61   Resp: 18 20 18    Temp: 98.2 F (36.8 C) 97.8 F (36.6 C) 98.4 F (36.9 C)   TempSrc: Oral     SpO2: 96% 94% 97%   Weight:    90.8 kg  Height:        Intake/Output Summary (Last 24 hours) at 12/04/2022 1409 Last data filed at 12/04/2022 1049 Gross per 24 hour  Intake 1028.71 ml  Output 500 ml  Net 528.71 ml   Filed Weights   12/02/22 2050 12/03/22 0500 12/04/22 0451  Weight: 92.3 kg 92.2 kg 90.8 kg    Data Reviewed: I have personally reviewed and interpreted daily labs, tele strips, imagings as discussed above. I reviewed all nursing notes, pharmacy notes, vitals, pertinent old records I have discussed plan of care as described above with RN and patient/family.  CBC: Recent Labs  Lab 12/02/22 1134 12/03/22 0657 12/04/22 0435  WBC 6.4 4.6 5.3  HGB 14.9 13.6 13.2  HCT 44.7 40.3 39.0  MCV 92.2 92.4 89.9  PLT 209 163 164   Basic Metabolic Panel: Recent Labs  Lab 12/02/22 1134 12/03/22 0657 12/04/22 0435  NA 137 139 138  K 3.9 4.2 3.9  CL 103 109 108  CO2 24 23 23   GLUCOSE 120* 97 100*  BUN 18 16 12   CREATININE 1.43* 1.35* 1.37*  CALCIUM 9.5 8.7* 8.9  MG  --   --  2.1  PHOS  --   --  3.5    Studies: No results found.  Scheduled Meds:  aspirin  81 mg Oral Daily   buPROPion  300 mg Oral Daily   clopidogrel  75 mg Oral Daily    enoxaparin (LOVENOX) injection  40 mg Subcutaneous QPM   pantoprazole  40 mg Oral Daily   sodium chloride flush  3 mL Intravenous Q12H   umeclidinium-vilanterol  1 puff Inhalation Daily   Continuous Infusions:  sodium chloride 50 mL/hr at 12/04/22 0304   sodium chloride Stopped (12/03/22 2201)   piperacillin-tazobactam (ZOSYN)  IV 3.375 g (12/04/22 1340)   PRN Meds: sodium chloride, acetaminophen **OR** acetaminophen, HYDROcodone-acetaminophen, HYDROmorphone (DILAUDID) injection, ondansetron (ZOFRAN) IV  Time spent: 35 minutes  Author: Gillis Santa. MD [facility] 12/04/2022 2:09 PM  To reach On-call, see care teams to locate the attending and reach out to them via [email or web address]. If 7PM-7AM, please contact night-coverage If you still have difficulty reaching the attending provider, please page the [facility] (Director on Call) for [facility] on amion for assistance.

## 2022-12-04 NOTE — Progress Notes (Signed)
[facility] SURGICAL PROGRESS NOTE (cpt [phone])  Hospital Day(s): 2.   Post op day(s):  Marland Kitchen   Interval History: Patient seen and examined, no acute events or new complaints overnight. Patient reports some improvement in pain, but some discomfort persisting in the epigastrium, denies nausea or vomiting or intolerance of full liquid diet.  Feels his appetite has improved.  He reports he has held his Plavix in the past for dental procedure.  He was held for a week and he had no issues.  He currently remains taking his Plavix, aspirin and has Lovenox for DVT prophylaxis.  Review of Systems:  Constitutional: denies fever, chills  HEENT: denies cough or congestion  Respiratory: denies any shortness of breath  Cardiovascular: denies chest pain or palpitations  Gastrointestinal: denies N/V, or diarrhea/and bowel function as per interval history Genitourinary: denies burning with urination or urinary frequency Musculoskeletal: denies pain, decreased motor or sensation Integumentary: denies any other rashes or skin discolorations Neurological: denies HA or vision/hearing changes   Vital signs in last 24 hours: [min-max] current  Temp:  [97.8 F (36.6 C)-98.4 F (36.9 C)] 98.4 F (36.9 C) (08/17 0415) Pulse Rate:  [53-70] 61 (08/17 0415) Resp:  [18-20] 18 (08/17 0415) BP: (117-132)/(65-76) 124/65 (08/17 0415) SpO2:  [94 %-97 %] 97 % (08/17 0415) Weight:  [90.8 kg] 90.8 kg (08/17 0451)     Height: 6' (182.9 cm) Weight: 90.8 kg BMI (Calculated): 27.14   Intake/Output last 2 shifts:  08/16 0701 - 08/17 0700 In: 848.7 [I.V.:751.1; IV Piggyback:97.6] Out: 500 [Urine:500]   Physical Exam:  Constitutional: alert, cooperative and no distress  HENT: normocephalic without obvious abnormality  Eyes: PERRL, EOM's grossly intact and symmetric  Neuro: CN II - XII grossly intact and symmetric without deficit  Respiratory: breathing non-labored at rest  Cardiovascular: regular rate and  sinus rhythm  Gastrointestinal: Epigastric/right upper quadrant tenderness, otherwise soft, non-tender, and non-distended Musculoskeletal: no edema or wounds, motor and sensation grossly intact, NT    Labs:     Latest Ref Rng & Units 12/04/2022    4:35 AM 12/03/2022    6:57 AM 12/02/2022   11:34 AM  CBC  WBC 4.0 - 10.5 K/uL 5.3  4.6  6.4   Hemoglobin 13.0 - 17.0 g/dL 47.8  29.5  62.1   Hematocrit 39.0 - 52.0 % 39.0  40.3  44.7   Platelets 150 - 400 K/uL 164  163  209       Latest Ref Rng & Units 12/04/2022    4:35 AM 12/03/2022    6:57 AM 12/02/2022    1:33 PM  CMP  Glucose 70 - 99 mg/dL 308  97    BUN 8 - 23 mg/dL 12  16    Creatinine 6.57 - 1.24 mg/dL 8.46  9.62    Sodium 952 - 145 mmol/L 138  139    Potassium 3.5 - 5.1 mmol/L 3.9  4.2    Chloride 98 - 111 mmol/L 108  109    CO2 22 - 32 mmol/L 23  23    Calcium 8.9 - 10.3 mg/dL 8.9  8.7    Total Protein 6.5 - 8.1 g/dL 6.6  6.4  7.8   Total Bilirubin 0.3 - 1.2 mg/dL 1.2  1.7  1.7   Alkaline Phos 38 - 126 U/L 91  102  107   AST 15 - 41 U/L 49  109  119   ALT 0 - 44 U/L 110  164  85      Imaging studies: No new pertinent imaging studies   Assessment/Plan:  67 y.o. male with acute onset epigastric right upper quadrant pain without evidence of gallstones, complicated by pertinent comorbidities including. 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 Unfortunately, he has been on Plavix and ASA given his history of STEMI. Thus a sub-optimal candidate for surgery at this time given bleeding risk. Given this, we will forgo surgery emergently. Should he clinically worsen, would need to consider IR evaluation for percutaneous cholecystostomy tube. Gallbladder is also contracted on most recent imaging, so this may not be feasible now and likely will require repeat US. We will continue to trial conservative management with IV Abx as long as he does well clinically.              - HIDA is another potential option for imaging, but likely does not change management course at this time.              -LFTs have improved; should these worsen, consider GI evaluation for potential ERCP             - Okay for full liquid diet             -Continue with IV Abx (Zosyn)             - Monitor abdominal examination; on-going bowel function             - Pain control prn; antiemetics prn             - Monitor LFTs; morning labs             - Okay to mobilize             - Further management per primary service; we will follow    All of the above findings and recommendations were discussed with the patient, , and the medical team, and all of patient's  questions were answered to hisexpressed satisfaction.   -- Campbell Lerner M.D., [facility] 12/04/2022 6:21 AM

## 2022-12-04 NOTE — Plan of Care (Signed)

## 2022-12-04 NOTE — TOC CM/SW Note (Signed)
Transition of Care [facility]) - Inpatient Brief Assessment   Patient Details  Name: BHAVYA SUVER MRN: [id number] Date of Birth: July 02, 1955  [facility] CM/SW Contact:    Liliana Cline, LCSW Phone Number: 12/04/2022, 9:25 AM   Clinical Narrative:    Transition of Care Asessment: Insurance and Status: Insurance coverage has been reviewed Patient has primary care physician: Yes     Prior/Current Home Services: No current home services Social Determinants of Health Reivew: SDOH reviewed no interventions necessary Readmission risk has been reviewed: Yes Transition of care needs: no transition of care needs at this time

## 2022-12-05 DIAGNOSIS — R001 Bradycardia, unspecified: Secondary | ICD-10-CM | POA: Diagnosis not present

## 2022-12-05 DIAGNOSIS — I2583 Coronary atherosclerosis due to lipid rich plaque: Secondary | ICD-10-CM

## 2022-12-05 DIAGNOSIS — I2510 Atherosclerotic heart disease of native coronary artery without angina pectoris: Secondary | ICD-10-CM | POA: Diagnosis not present

## 2022-12-05 DIAGNOSIS — I255 Ischemic cardiomyopathy: Secondary | ICD-10-CM

## 2022-12-05 DIAGNOSIS — I491 Atrial premature depolarization: Secondary | ICD-10-CM | POA: Diagnosis not present

## 2022-12-05 DIAGNOSIS — R1011 Right upper quadrant pain: Secondary | ICD-10-CM | POA: Diagnosis not present

## 2022-12-05 DIAGNOSIS — K810 Acute cholecystitis: Secondary | ICD-10-CM | POA: Diagnosis not present

## 2022-12-05 DIAGNOSIS — E7800 Pure hypercholesterolemia, unspecified: Secondary | ICD-10-CM

## 2022-12-05 LAB — BASIC METABOLIC PANEL WITH GFR
Anion gap: 8 (ref 5–15)
BUN: 9 mg/dL (ref 8–23)
CO2: 23 mmol/L (ref 22–32)
Calcium: 8.8 mg/dL — ABNORMAL LOW (ref 8.9–10.3)
Chloride: 107 mmol/L (ref 98–111)
Creatinine, Ser: 1.48 mg/dL — ABNORMAL HIGH (ref 0.61–1.24)
GFR, Estimated: 52 mL/min — ABNORMAL LOW
Glucose, Bld: 90 mg/dL (ref 70–99)
Potassium: 3.6 mmol/L (ref 3.5–5.1)
Sodium: 138 mmol/L (ref 135–145)

## 2022-12-05 LAB — CBC
HCT: 38.3 % — ABNORMAL LOW (ref 39.0–52.0)
Hemoglobin: 13.4 g/dL (ref 13.0–17.0)
MCH: 31.5 pg (ref 26.0–34.0)
MCHC: 35 g/dL (ref 30.0–36.0)
MCV: 90.1 fL (ref 80.0–100.0)
Platelets: 161 K/uL (ref 150–400)
RBC: 4.25 MIL/uL (ref 4.22–5.81)
RDW: 13.4 % (ref 11.5–15.5)
WBC: 4.1 K/uL (ref 4.0–10.5)
nRBC: 0 % (ref 0.0–0.2)

## 2022-12-05 LAB — HEPATIC FUNCTION PANEL
ALT: 105 U/L — ABNORMAL HIGH (ref 0–44)
AST: 51 U/L — ABNORMAL HIGH (ref 15–41)
Albumin: 3.5 g/dL (ref 3.5–5.0)
Alkaline Phosphatase: 112 U/L (ref 38–126)
Bilirubin, Direct: 0.3 mg/dL — ABNORMAL HIGH (ref 0.0–0.2)
Indirect Bilirubin: 0.7 mg/dL (ref 0.3–0.9)
Total Bilirubin: 1 mg/dL (ref 0.3–1.2)
Total Protein: 6.4 g/dL — ABNORMAL LOW (ref 6.5–8.1)

## 2022-12-05 LAB — PHOSPHORUS: Phosphorus: 3.6 mg/dL (ref 2.5–4.6)

## 2022-12-05 LAB — LIPASE, BLOOD: Lipase: 31 U/L (ref 11–51)

## 2022-12-05 LAB — MAGNESIUM: Magnesium: 2.2 mg/dL (ref 1.7–2.4)

## 2022-12-05 MED ORDER — SACUBITRIL-VALSARTAN 24-26 MG PO TABS
1.0000 | ORAL_TABLET | Freq: Two times a day (BID) | ORAL | Status: DC
  Administered 2022-12-05 – 2022-12-07 (×5): 1 via ORAL
  Filled 2022-12-05 (×5): qty 1

## 2022-12-05 MED ORDER — METOPROLOL SUCCINATE ER 25 MG PO TB24
12.5000 mg | ORAL_TABLET | Freq: Every day | ORAL | Status: DC
  Administered 2022-12-05 – 2022-12-07 (×3): 12.5 mg via ORAL
  Filled 2022-12-05 (×3): qty 1

## 2022-12-05 NOTE — Progress Notes (Signed)
CCMD called to tell me that patient has been SB. Heart rate will drop to 40's at times then return to 50's. She said it dropped earlier this a.m. to the low 40's and stayed there. He is currently back to mid and upper 50's. Dr Lucianne Muss notified

## 2022-12-05 NOTE — Plan of Care (Signed)

## 2022-12-05 NOTE — Consult Note (Signed)
Cardiology Consultation:   Patient ID: [facility]; [id number]; 10-27-1955   Admit date: 12/02/2022 Date of Consult: 12/05/2022  Primary Care Provider: Dortha Kern, MD Primary Cardiologist: [facility] Primary Electrophysiologist:  [facility]   Patient Profile:   [facility] is a 67 y.o. 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  History of Present Illness:   [facility] was admitted to the hospital in 12/2020 with inferior ST elevation MI.  Emergent LHC showed codominant coronary arteries with thrombotic occlusion of the proximal LCx.  He underwent successful PCI/DES to the LCx.  Echo showed an EF of 50%.  He had atrial fibrillation that was treated with amiodarone drip and converted to sinus rhythm.  While on amiodarone, he developed prolonged QT with frequent PVCs followed by polymorphic VT that required multiple shocks.  Amiodarone was discontinued.  Repeat echo showed stable EF of 45 to 50%.  Due to continued shortness of breath he underwent repeat LHC in 02/2021 that showed patent LCx stent with no significant restenosis with an EF of 35 to 40%.  At that time, metoprolol was discontinued due to bradycardia with heart rates in the 40s bpm.  Zio patch in 03/2021 showed a predominant rhythm of sinus with an average rate of 62 bpm with 5 runs of NSVT, 119 episodes of SVT, occasional PVCs, and a 15% PAC burden.  In this setting, he was evaluated by EP with repeat cardiac monitoring showing 5 episodes of NSVT, 109 SVT episodes lasting up to 29.4 seconds, frequent  PACs representing an 11.6% burden, and occasional PVCs representing a 2.9% burden.  Echo in 06/2021 showed an EF of 45%.  Lexiscan MPI in 04/2022 showed evidence of prior infarct in the LCx distribution without ischemia with an EF of 48%.  Echo in 04/2022 showed an EF of 35 to 40% with severe hypokinesis in the inferior wall, grade 2 diastolic dysfunction, normal RV systolic function and ventricular cavity size, mild to moderate mitral regurgitation, and an estimated right atrial pressure of 3 mmHg.  Despite adjustment in beta-blocker therapy, he has continued to have frequent atrial ectopy burden with monitoring in 07/2022 showing a 17% PAC burden with 2.4% PVC burden with continued episodes of SVT.  Most recent outpatient cardiac monitoring from 09/2022 showed an average rate of 53 bpm with 1 episode of NSVT lasting 4 beats, 30 episodes of SVT lasting up to 26.5 seconds, and improved PAC burden at 8.4%.  Management of his arrhythmia and ectopy burden has been limited by intolerance to beta-blockers and/or bradycardia including bisoprolol, carvedilol, and Toprol-XL.  He was admitted to [facility] on 12/02/2022 with acute cholecystitis status post MRCP with conservative management for now with contracted gallbladder with plans for definitive treatment as outpatient.  High-sensitivity troponins negative this admission.  Initial EKG upon admission showed sinus bradycardia, 57 bpm, no acute ST-T changes.  On telemetry, the patient has been bradycardic in the 40s to 50s bpm with stable blood pressure in the 1 teens to 120s systolic.  He has been asymptomatic  with bradycardic rates.  With noted bradycardia, primary service discontinued metoprolol on 8/17, of note patient had not received metoprolol succinate 12.5 mg during this admission.  Cardiology asked to evaluate bradycardia.  Review of heart rates on tele shows sinus bradycardia in the 40s to 50s bpm predominantly with episodes into 80s bpm with occasional PACs. Currently  with heart rates in the 80s bpm.  With having not received metoprolol this admission, he is beginning to note an increase in palpitations and SOB, which are typical symptoms for him with atrial ectopy burden. No chest pain. He would like to resume PTA metoprolol. His Sherryll Burger has also been held this admission and would like to resume this as well.    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    Hyperlipidemia LDL goal <70    Hypertension    Ischemic cardiomyopathy    Smoking     Past Surgical History:  Procedure Laterality Date   COLONOSCOPY WITH PROPOFOL N/A 05/07/2022   Procedure: COLONOSCOPY WITH PROPOFOL;  Surgeon: Darryl Mood, MD;  Location: [facility];  Service: Gastroenterology;  Laterality: N/A;   CORONARY/GRAFT ACUTE MI REVASCULARIZATION N/A 12/20/2020   Procedure: Coronary/Graft Acute MI Revascularization;  Surgeon: Darryl Ouch, MD;  Location: [facility];  Service: Cardiovascular;  Laterality: N/A;   FRACTURE SURGERY     Left leg fracture, rod placement   LEFT HEART CATH AND CORONARY ANGIOGRAPHY N/A 12/20/2020   Procedure: LEFT HEART CATH AND CORONARY ANGIOGRAPHY;  Surgeon: [facility]ryl Ouch, MD;  Location: [facility];  Service: Cardiovascular;  Laterality: N/A;   LEFT HEART CATH AND CORONARY ANGIOGRAPHY N/A 02/23/2021   Procedure: LEFT HEART CATH AND CORONARY ANGIOGRAPHY;  Surgeon: Darryl Ouch, MD;  Location: [facility];  Service: Cardiovascular;  Laterality: N/A;     Home Meds: Prior to Admission medications   Medication Sig Start Date End Date Taking? Authorizing Provider  aspirin 81 MG chewable tablet Chew 1 tablet (81 mg total) by mouth daily. 12/26/20  Yes Darryl Lloyd, MD  atorvastatin (LIPITOR) 80 MG tablet Take 1 tablet (80 mg total) by mouth daily. 01/20/22  Yes  Darryl Prude, MD  buPROPion (WELLBUTRIN XL) 300 MG 24 hr tablet Take 300 mg by mouth daily. 01/20/22  Yes [provider]  clopidogrel (PLAVIX) 75 MG tablet Take 1 tablet (75 mg total) by mouth daily. 07/09/22  Yes Darryl Ouch, MD  esomeprazole (NEXIUM) 20 MG capsule Take 1 capsule (20 mg total) by mouth daily at 12 noon. 12/31/20  Yes Ahnna Dungan, Raymon Mutton, PA-C  furosemide (LASIX) 20 MG tablet Take 1 tablet (20 mg total) by mouth daily. 10/07/22  Yes Darryl Ouch, MD  HYDROcodone-acetaminophen (NORCO) 10-325 MG tablet Take 1 tablet by mouth 3 (three) times daily as needed. 12/25/21  Yes [provider]  metoprolol succinate (TOPROL XL) 25 MG 24 hr tablet Take 0.5 tablets (12.5 mg total) by mouth at bedtime. 11/09/22  Yes Sherie Don, NP  nitroGLYCERIN (NITROSTAT) 0.4 MG SL tablet Place 1 tablet (0.4 mg total) under the tongue every 5 (five) minutes as needed for chest pain. 04/08/22  Yes Dameka Younker, Raymon Mutton, PA-C  potassium chloride (KLOR-CON) 10 MEQ tablet Take 1 tablet (10 mEq total) by mouth daily. 08/11/22  Yes Sherie Don, NP  tamsulosin [facility])  0.4 MG CAPS capsule Take 0.4 mg by mouth daily.   Yes [provider]  umeclidinium-vilanterol (ANORO ELLIPTA) 62.5-25 MCG/ACT AEPB Inhale 1 puff into the lungs daily. 10/15/22 10/15/23 Yes Dgayli, Lianne Bushy, MD  sacubitril-valsartan (ENTRESTO) 24-26 MG Take 1 tablet by mouth 2 (two) times daily. Patient not taking: Reported on 12/03/2022 03/04/22   Darryl Ouch, MD    Inpatient Medications: Scheduled Meds:  aspirin  81 mg Oral Daily   buPROPion  300 mg Oral Daily   clopidogrel  75 mg Oral Daily   enoxaparin (LOVENOX) injection  40 mg Subcutaneous QPM   pantoprazole  40 mg Oral Daily   sodium chloride flush  3 mL Intravenous Q12H   umeclidinium-vilanterol  1 puff Inhalation Daily   Continuous Infusions:  sodium chloride Stopped (12/03/22 2201)   piperacillin-tazobactam (ZOSYN)  IV 3.375 g (12/05/22 0501)   PRN  Meds: sodium chloride, acetaminophen **OR** acetaminophen, HYDROcodone-acetaminophen, HYDROmorphone (DILAUDID) injection, ondansetron (ZOFRAN) IV  Allergies:   Allergies  Allergen Reactions   Amiodarone Other (See Comments)    QT prolongation in the context of inferior STEMI and IV amiodarone use. Avoid all QT prolonging medications.     Social History:   Social History   Socioeconomic History   Marital status: Divorced    Spouse name: Not on file   Number of children: Not on file   Years of education: Not on file   Highest education level: Not on file  Occupational History   Not on file  Tobacco Use   Smoking status: Former    Current packs/day: 0.00    Average packs/day: 1.5 packs/day for 45.0 years (67.5 ttl pk-yrs)    Types: Cigarettes    Start date: 12/21/1975    Quit date: 12/20/2020    Years since quitting: 1.9   Smokeless tobacco: Never  Vaping Use   Vaping status: Never Used  Substance and Sexual Activity   Alcohol use: Not Currently   Drug use: Never   Sexual activity: Not on file  Other Topics Concern   Not on file  Social History Narrative   Not on file   Social Determinants of Health   Financial Resource Strain: Not on file  Food Insecurity: No Food Insecurity (12/03/2022)   Hunger Vital Sign    Worried About Running Out of Food in the Last Year: Never true    Ran Out of Food in the Last Year: Never true  Transportation Needs: No Transportation Needs (12/03/2022)   PRAPARE - Administrator, Civil Service (Medical): No    Lack of Transportation (Non-Medical): No  Physical Activity: Not on file  Stress: Not on file  Social Connections: Not on file  Intimate Partner Violence: Not At Risk (12/03/2022)   Humiliation, Afraid, Rape, and Kick questionnaire    Fear of Current or Ex-Partner: No    Emotionally Abused: No    Physically Abused: No    Sexually Abused: No     Family History:   History reviewed. No pertinent family history.  ROS:   Review of Systems  Constitutional:  Positive for malaise/fatigue. Negative for chills, diaphoresis, fever and weight loss.  HENT:  Negative for congestion.   Eyes:  Negative for discharge and redness.  Respiratory:  Positive for shortness of breath. Negative for cough, sputum production and wheezing.   Cardiovascular:  Positive for palpitations. Negative for chest pain, orthopnea, claudication, leg swelling and PND.  Gastrointestinal:  Positive for abdominal pain. Negative for heartburn, nausea  and vomiting.  Musculoskeletal:  Negative for falls and myalgias.  Skin:  Negative for rash.  Neurological:  Negative for dizziness, tingling, tremors, sensory change, speech change, focal weakness, loss of consciousness and weakness.  Endo/Heme/Allergies:  Does not bruise/bleed easily.  Psychiatric/Behavioral:  Negative for substance abuse. The patient is not nervous/anxious.   All other systems reviewed and are negative.     Physical Exam/Data:   Vitals:   12/04/22 0451 12/04/22 1940 12/05/22 0430 12/05/22 0832  BP:  128/72 114/83 (!) 124/98  Pulse:  (!) 50 (!) 46 (!) 50  Resp:  20 16 16   Temp:  97.8 F (36.6 C) 97.6 F (36.4 C) 97.6 F (36.4 C)  TempSrc:  Oral Oral   SpO2:  94% 94% 96%  Weight: 90.8 kg  90 kg   Height:        Intake/Output Summary (Last 24 hours) at 12/05/2022 0931 Last data filed at 12/04/2022 1946 Gross per 24 hour  Intake 1140 ml  Output --  Net 1140 ml   Filed Weights   12/03/22 0500 12/04/22 0451 12/05/22 0430  Weight: 92.2 kg 90.8 kg 90 kg   Body mass index is 26.91 kg/m.   Physical Exam: General: Well developed, well nourished, in no acute distress. Head: Normocephalic, atraumatic, sclera non-icteric, no xanthomas, nares without discharge.  Neck: Negative for carotid bruits. JVD not elevated. Lungs: Clear bilaterally to auscultation without wheezes, rales, or rhonchi. Breathing is unlabored. Heart: RRR with frequent ectopy with S1 S2. No murmurs,  rubs, or gallops appreciated. Abdomen: Soft, non-tender, non-distended with normoactive bowel sounds. No hepatomegaly. No rebound/guarding. No obvious abdominal masses. Msk:  Strength and tone appear normal for age. Extremities: No clubbing or cyanosis. No edema. Distal pedal pulses are 2+ and equal bilaterally. Neuro: Alert and oriented X 3. No facial asymmetry. No focal deficit. Moves all extremities spontaneously. Psych:  Responds to questions appropriately with a normal affect.   EKG:  The EKG was personally reviewed and demonstrates: Sinus bradycardia, 57 bpm, no acute ST-T changes Telemetry:  Telemetry was personally reviewed and demonstrates: sinus rhythm with sinus bradycardia in the 40s to 80s bpm, frequent PACs  Weights: Filed Weights   12/03/22 0500 12/04/22 0451 12/05/22 0430  Weight: 92.2 kg 90.8 kg 90 kg    Relevant CV Studies:  Zio patch 09/2022: HR 38 - 179 bpm, average 53 bpm. 1 NSVT lasting 4 beats. 30 nonsustained SVT, longest 26.5 seconds.  Frequent supraventricular ectopy, 8.4%. Rare ventricular ectopy. No atrial fibrillation. No sustained arrhythmias.   Improved burden of SVT and supraventricular ectopy compared to prior monitor. __________  Zio patch 07/2022: HR 46 - 226, average 65 bpm. 50 nonsustained SVT, longest 13.8 seconds with an average rate of 159 bpm. Frequent supraventricular ectopy, 17.3% Occasional ventricular ectopy, 2.4%   HR 41 - 245, average 61 bpm. 1 nonsustained VT lasting 5 beats 196 nonsustained SVT, longest 22.1 seconds with an average rate of 166 bpm. <1% AF burden Frequent supraventricular ectopy, 17.3% Occasional ventricular ectopy, 2.8% __________  Eugenie Birks MPI 05/06/2022: Pharmacological myocardial perfusion imaging study with no significant  ischemia Moderate-sized fixed perfusion defect in the basal to mid lateral wall consistent with prior MI Hypokinesis of the basal to mid lateral wall, EF estimated at 48%, GI uptake  artifact noted No EKG changes concerning for ischemia at peak stress or in recovery. CT attenuation correction images with coronary disease and stenting in the left circumflex, mild coronary calcification in the LAD, no significant aortic  atherosclerosis Low risk scan __________  2D echo 04/27/2022: 1. Left ventricular ejection fraction, by estimation, is 35 to 40%. The  left ventricle has moderately decreased function. The left ventricle  demonstrates global hypokinesis with severe hypokinesis of the inferior  wall. The left ventricular internal  cavity size was mildly dilated. Left ventricular diastolic parameters are  consistent with Grade II diastolic dysfunction (pseudonormalization).   2. Right ventricular systolic function is normal. The right ventricular  size is normal.   3. The mitral valve is normal in structure. Mild to moderate mitral valve  regurgitation. No evidence of mitral stenosis.   4. The aortic valve has an indeterminant number of cusps. Aortic valve  regurgitation is not visualized. No aortic stenosis is present.   5. The inferior vena cava is normal in size with greater than 50%  respiratory variability, suggesting right atrial pressure of 3 mmHg.   Comparison(s): 06/25/21-EF 45%.  __________  Limited echo 06/25/2021: 1. Left ventricular ejection fraction, by estimation, is 45%. The left  ventricle has mildly decreased function. The left ventricle has no  regional wall motion abnormalities. There is mild left ventricular  hypertrophy. Left ventricular diastolic  parameters are consistent with Grade II diastolic dysfunction  (pseudonormalization).   2. Right ventricular systolic function is normal.   3. The mitral valve is normal in structure. No evidence of mitral valve  regurgitation.   4. The aortic valve is tricuspid. Aortic valve regurgitation is not  visualized.  __________  Luci Bank patch 05/2021: HR 40 - 218 bpm, average 59 bpm. 5 NSVT, longest lasting 10  beats at an average rate of 103 bpm. 109 SVT episodes, longest lasting 29.4 seconds with an average rate of 100 bpm. Frequent supraventricualr ectopy, 11.6%. Occasional ventricular ectopy, 2.9%. __________  Luci Bank patch 03/2021: Patient had a min HR of 42 bpm, max HR of 200 bpm, and avg HR of 62 bpm.  Predominant underlying rhythm was Sinus Rhythm. QRS morphology changes were present throughout recording.  5 Ventricular Tachycardia runs occurred, the run with the fastest interval lasting 12 beats with a max rate of 190 bpm (avg 156 bpm); the run with the fastest interval was also the longest. Some episodes of Ventricular Tachycardia may be Supraventricular Tachycardia with possible aberrancy.  119 Supraventricular Tachycardia runs occurred, the run with the fastest interval lasting 7 beats with a max rate of 200 bpm, the longest lasting 13.7 secs with an avg rate of 122 bpm. Ectopic Atrial Rhythm was present. Ventricular Tachycardia and Supraventricular Tachycardia were detected within +/- 45 seconds of symptomatic patient event(s). Frequent PACs with a burden of 15% and occasional PVCs. __________  [facility] 02/23/2021:   1st Mrg lesion is 50% stenosed.   Dist LAD lesion is 80% stenosed.   LPAV lesion is 50% stenosed.   Ost RCA to Prox RCA lesion is 40% stenosed.   Non-stenotic Prox Cx to Mid Cx lesion was previously treated.   There is moderate left ventricular systolic dysfunction.   LV end diastolic pressure is normal.   The left ventricular ejection fraction is 35-45% by visual estimate.   1.  Widely patent left circumflex stent with no significant restenosis.  The RCA is codominant with high anterior takeoff.  The 70% ostial stenosis that was noted on previous catheterization appears to be better and no more than 40%.  Suspect previous catheter induced spasm.  The proximal portion of the right coronary artery has an ectatic segment causing some sluggish flow in the vessel which  was noted on  previous angiography as well. 2.  Moderately reduced LV systolic function with an EF of 35 to 40%.  Normal left ventricular end-diastolic pressure at 7 mmHg.   Recommendations: Recommend continued medical therapy for coronary artery disease. The patient was noted to be bradycardic on presentation with heart rate in the low 40s.  Thus, I elected to discontinue metoprolol. The exact etiology for his continued shortness of breath is not entirely clear but there could be a component of physical deconditioning.  In addition, he does have cardiomyopathy and we will have to treat him medically for heart failure.  His LVEDP was normal. ___________  Limited echo 12/22/2020: 1. Limited study   2. Left ventricular ejection fraction, by estimation, is 45 to 50%. The  left ventricle has mildly decreased function. The left ventricle  demonstrates regional wall motion abnormalities (Hypokinesis of the  inferolateral wall). There is mild left  ventricular hypertrophy.   3. Right ventricular systolic function is normal. The right ventricular  size is normal. Tricuspid regurgitation signal is inadequate for assessing  PA pressure.  __________  2D echo 12/21/2020: 1. Left ventricular ejection fraction, by estimation, is 50%. The left  ventricle has low normal function. The left ventricle demonstrates  regional wall motion abnormalities (see scoring diagram/findings for  description). There is mild left ventricular  hypertrophy. Left ventricular diastolic parameters are consistent with  Grade II diastolic dysfunction (pseudonormalization). There is moderate  hypokinesis of the left ventricular, entire inferolateral wall.   2. Right ventricular systolic function is normal. The right ventricular  size is normal.   3. The mitral valve is normal in structure. No evidence of mitral valve  regurgitation.   4. The aortic valve is tricuspid. Aortic valve regurgitation is not  visualized.  __________  [facility]  12/20/2020:   Ost RCA to Prox RCA lesion is 70% stenosed.   Prox Cx to Mid Cx lesion is 100% stenosed.   1st Mrg lesion is 60% stenosed.   A drug-eluting stent was successfully placed using a STENT ONYX FRONTIER 3.0X22.   Post intervention, there is a 0% residual stenosis.   There is moderate left ventricular systolic dysfunction.   LV end diastolic pressure is moderately elevated.   The left ventricular ejection fraction is 35-45% by visual estimate.   1.  Codominant coronary arteries with thrombotic occlusion of the proximal left circumflex which is the culprit for inferior STEMI.  There is also 70% stenosis in ostial right coronary artery which has high anterior takeoff.  Catheter induced spasm in the ostial right coronary artery cannot be completely excluded. 2.  Moderately reduced LV systolic function with segmental wall motion abnormalities in the left circumflex distribution.  Moderately elevated left ventricular end-diastolic pressure at 22 mmHg. 3.  Successful angioplasty and drug-eluting stent placement to the left circumflex.   Recommendations: Dual antiplatelet therapy for at least 12 months. Aggressive treatment of risk factors and smoking cessation. The patient had atrial fibrillation on presentation but converted sinus rhythm at the end of the case with amiodarone drip.  Continue amiodarone drip for now and monitor rhythm closely.  Laboratory Data:  Chemistry Recent Labs  [facility] 12/03/22 0657 12/04/22 0435 12/05/22 0417  NA 139 138 138  K 4.2 3.9 3.6  CL 109 108 107  CO2 23 23 23   GLUCOSE 97 100* 90  BUN 16 12 9   CREATININE 1.35* 1.37* 1.48*  CALCIUM 8.7* 8.9 8.8*  GFRNONAA 58* 57* 52*  ANIONGAP 7 7 8  Recent Labs  [facility] 12/03/22 0657 12/04/22 0435 12/05/22 0417  PROT 6.4* 6.6 6.4*  ALBUMIN 3.4* 3.6 3.5  AST 109* 49* 51*  ALT 164* 110* 105*  ALKPHOS 102 91 112  BILITOT 1.7* 1.2 1.0   Hematology Recent Labs  [facility] 12/03/22 0657 12/04/22 0435 12/05/22 0417   WBC 4.6 5.3 4.1  RBC 4.36 4.34 4.25  HGB 13.6 13.2 13.4  HCT 40.3 39.0 38.3*  MCV 92.4 89.9 90.1  MCH 31.2 30.4 31.5  MCHC 33.7 33.8 35.0  RDW 13.5 13.4 13.4  PLT 163 164 161   Cardiac EnzymesNo results for input(s): "TROPONINI" in the last 168 hours. No results for input(s): "TROPIPOC" in the last 168 hours.  BNPNo results for input(s): "BNP", "PROBNP" in the last 168 hours.  DDimer No results for input(s): "DDIMER" in the last 168 hours.  Radiology/Studies:  MR ABDOMEN MRCP W WO CONTAST  Result Date: 12/03/2022 IMPRESSION: 1. 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 The gallbladder is contracted, leading to wall thickening making it difficult to assess for inflammation. There is a suggestion of some gallbladder wall thickening/edema but much of this could be secondary to contraction. There is sludge in the gallbladder without visualized gallstones. 2. No biliary dilatation. Question of a distal CBD stone on a single image, but this is not confirmed on any other imaging sequences and given the degree of artifact caused by motion, this is more likely to be artifactual. 3. Infrarenal abdominal aortic aneurysm 4.0 cm in diameter. Recommend follow-up every 12 months and vascular consultation. This recommendation follows ACR consensus guidelines: White Paper of the ACR Incidental Findings Committee II on Vascular Findings. J Am Coll Radiol 2013; 78:295-621. 4. Dextroconvex lumbar scoliosis with lower thoracic and lumbar spondylosis and degenerative disc disease. 5. Dependent subsegmental atelectasis in the lower lobes. Electronically Signed   By: Gaylyn Rong M.D.   On: 12/03/2022 06:56   MR 3D Recon At Scanner  Result Date: 12/03/2022 IMPRESSION: 1. 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 The gallbladder is contracted, leading to wall thickening making it difficult to assess for inflammation. There is a suggestion of  some gallbladder wall thickening/edema but much of this could be secondary to contraction. There is sludge in the gallbladder without visualized gallstones. 2. No biliary dilatation. Question of a distal CBD stone on a single image, but this is not confirmed on any other imaging sequences and given the degree of artifact caused by motion, this is more likely to be artifactual. 3. Infrarenal abdominal aortic aneurysm 4.0 cm in diameter. Recommend follow-up every 12 months and vascular consultation. This recommendation follows ACR consensus guidelines: White Paper of the ACR Incidental Findings Committee II on Vascular Findings. J Am Coll Radiol 2013; 30:865-784. 4. Dextroconvex lumbar scoliosis with lower thoracic and lumbar spondylosis and degenerative disc disease. 5. Dependent subsegmental atelectasis in the lower lobes. Electronically Signed   By: Gaylyn Rong M.D.   On: 12/03/2022 06:56   CT ABDOMEN PELVIS W CONTRAST  Result Date: 12/02/2022 IMPRESSION: 1. Haziness around the gallbladder, without calcified stone or biliary dilatation. Acute cholecystitis not confirmed by previously performed ultrasound. Correlation with nuclear medicine hepatobiliary imaging could be obtained if indicated. 2. Diverticular disease of the left colon without acute bowel wall thickening. 3. Diffuse aneurysmal dilatation of the infrarenal abdominal aorta up to 4.1 cm. Recommend follow-up every 12 months and vascular consultation. This recommendation follows ACR consensus guidelines: White Paper of the  ACR Incidental Findings Committee II on Vascular Findings. J Am Coll Radiol 2013; 40:102-725. 4. Aortic atherosclerosis. Aortic Atherosclerosis (ICD10-I70.0). Electronically Signed   By: Jasmine Pang M.D.   On: 12/02/2022 17:43   US Abdomen Limited RUQ (LIVER/GB)  Result Date: 12/02/2022 IMPRESSION: Distended gallbladder.  No stones.  No ductal dilatation. Minimally complex left hepatic lobe cyst measuring 11 mm  Electronically Signed   By: Karen Kays M.D.   On: 12/02/2022 16:00   DG Chest 2 View  Result Date: 12/02/2022 IMPRESSION: Hyperinflation with chronic lung changes. No acute cardiopulmonary disease. Electronically Signed   By: Karen Kays M.D.   On: 12/02/2022 13:16    Assessment and Plan:   1.  Sinus bradycardia: -Known and followed by EP with recent outpatient cardiac monitoring showing an average ventricular rate of 53 bpm -Overall, readings noted on telemetry are consistent with known bradycardic events with episodes of sinus rhythm into the 80s bpm with PACs -Patient has not received Toprol-XL 12.5 mg this admission -With continued bradycardia in the 40s bpm, Toprol-XL has been held -Currently, heart rates are in the 80s bpm with frequent PACs -Resume PTA Toprol XL 12.5 mg daily and monitor on telemetry -Recommend EP evaluation on 8/19 for further recommendations of ongoing management as he is likely to have recurrent atrial arrhythmia and ectopy burden off beta-blocker  2.  PSVT with frequent PACs: -Patient with significant nonsustained SVT and atrial ectopy burden noted on multiple outpatient cardiac monitors that had improved with low-dose metoprolol -Patient has known sinus bradycardia in the 50s on low-dose metoprolol -Resume Toprol XL as above -Without beta-blocker suppression, patient will have recurrence of significant symptomatic atrial arrhythmia and ectopy burden  3.  CAD involving the native coronary arteries: -Ruled out -PTA aspirin and clopidogrel, remains on DAPT with ectasia of the RCA -Atorvastatin has been held this admission in the setting of transaminitis, resume when able -No plans for inpatient ischemic testing  4.  HFrEF secondary to ICM: -Euvolemic and well compensated  -Resume PTA Entresto 24/26 mg twice daily -Historically, baseline bradycardia has precluded escalation of low-dose beta-blocker -Resume low-dose Toprol XL 12.5 mg daily  5.  HLD: -LDL  58 in 02/2022 -PTA atorvastatin held with elevated LFTs, resume when able  6.  Lone A-fib: -In the setting of MI -No evidence of recurrence on multiple outpatient cardiac monitors -Not on OAC  7.  CKD stage IIIa: -Renal function stable  8.  Infrarenal AAA: -Diffuse aneurysmal dilatation of the infrarenal abdominal aorta measuring 4.1 cm with recommendation for follow-up imaging in 12 months -Optimal blood pressure and lipid control recommended, along with continued complete smoking cessation    For questions or updates, please contact [facility] Please consult [email or web address] for contact info under Cardiology/STEMI.   Signed, Eula Listen, PA-C [facility] Pager: [phone] 12/05/2022, 9:31 AM

## 2022-12-05 NOTE — Progress Notes (Signed)
[facility] SURGICAL PROGRESS NOTE (cpt [phone])  Hospital Day(s): 3.   Post op day(s):  Darryl Gilbert   Interval History: Patient seen and examined, some shortness of breath with controlled bradycardia noted. Cardiology following. Patient reports some improvement in pain, but some discomfort persisting in the epigastrium, denies nausea or vomiting or intolerance of full liquid diet.  Feels his appetite has improved.  He currently remains taking his Plavix, aspirin and has Lovenox for DVT prophylaxis.  He reports plan for HIDA scan in a.m.  Review of Systems:  Constitutional: denies fever, chills  HEENT: denies cough or congestion  Respiratory: denies any shortness of breath  Cardiovascular: denies chest pain or palpitations  Gastrointestinal: denies N/V, or diarrhea/and bowel function as per interval history Genitourinary: denies burning with urination or urinary frequency Musculoskeletal: denies pain, decreased motor or sensation Integumentary: denies any other rashes or skin discolorations Neurological: denies HA or vision/hearing changes   Vital signs in last 24 hours: [min-max] current  Temp:  [97.6 F (36.4 C)-97.8 F (36.6 C)] 97.6 F (36.4 C) (08/18 0832) Pulse Rate:  [46-95] 95 (08/18 0953) Resp:  [16-20] 16 (08/18 0832) BP: (114-128)/(72-98) 124/98 (08/18 0832) SpO2:  [94 %-96 %] 96 % (08/18 0832) Weight:  [90 kg] 90 kg (08/18 0430)     Height: 6' (182.9 cm) Weight: 90 kg BMI (Calculated): 26.9   Intake/Output last 2 shifts:  08/17 0701 - 08/18 0700 In: 1140 [P.O.:1140] Out: -    Physical Exam:  Constitutional: alert, cooperative and no distress  HENT: normocephalic without obvious abnormality  Eyes: PERRL, EOM's grossly intact and symmetric  Neuro: CN II - XII grossly intact and symmetric without deficit  Respiratory: breathing non-labored at rest  Cardiovascular: regular rate and sinus rhythm  Gastrointestinal: Epigastric/right upper quadrant tenderness  somewhat improved, otherwise soft, non-tender, and non-distended Musculoskeletal: no edema or wounds, motor and sensation grossly intact, NT    Labs:     Latest Ref Rng & Units 12/05/2022    4:17 AM 12/04/2022    4:35 AM 12/03/2022    6:57 AM  CBC  WBC 4.0 - 10.5 K/uL 4.1  5.3  4.6   Hemoglobin 13.0 - 17.0 g/dL 98.1  19.1  47.8   Hematocrit 39.0 - 52.0 % 38.3  39.0  40.3   Platelets 150 - 400 K/uL 161  164  163       Latest Ref Rng & Units 12/05/2022    4:17 AM 12/04/2022    4:35 AM 12/03/2022    6:57 AM  CMP  Glucose 70 - 99 mg/dL 90  295  97   BUN 8 - 23 mg/dL 9  12  16    Creatinine 0.61 - 1.24 mg/dL 6.21  3.08  6.57   Sodium 135 - 145 mmol/L 138  138  139   Potassium 3.5 - 5.1 mmol/L 3.6  3.9  4.2   Chloride 98 - 111 mmol/L 107  108  109   CO2 22 - 32 mmol/L 23  23  23    Calcium 8.9 - 10.3 mg/dL 8.8  8.9  8.7   Total Protein 6.5 - 8.1 g/dL 6.4  6.6  6.4   Total Bilirubin 0.3 - 1.2 mg/dL 1.0  1.2  1.7   Alkaline Phos 38 - 126 U/L 112  91  102   AST 15 - 41 U/L 51  49  109   ALT 0 - 44 U/L 105  110  164  Imaging studies: No new pertinent imaging studies   Assessment/Plan:  67 y.o. male with acute onset epigastric right upper quadrant pain without evidence of gallstones, complicated by pertinent comorbidities including. 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 Unfortunately, he has been on Plavix and ASA given his history of STEMI. Thus a sub-optimal candidate for surgery at this time given bleeding risk. Given this, we will forgo surgery emergently.  Agree with proceeding with HIDA scan, and if positive, would consider IR evaluation for percutaneous cholecystostomy tube.  This was discussed in detail with patient and significant other at bedside.  We will continue to trial conservative management with IV Abx as long as he does well clinically.              -LFTs have improved;              - Okay for full liquid diet, n.p.o. at midnight for hide in AM.             -Continue with IV Abx (Zosyn)             - Monitor abdominal examination; on-going bowel function             - Pain control prn; antiemetics prn             - Okay to mobilize             - Further management per primary service; we will follow    All of the above findings and recommendations were discussed with the patient, , and the medical team, and all of patient's  questions were answered to hisexpressed satisfaction.   -- Campbell Lerner M.D., [facility] 12/05/2022 12:23 PM

## 2022-12-05 NOTE — Plan of Care (Signed)

## 2022-12-05 NOTE — Plan of Care (Signed)
  Problem: Safety: Goal: Ability to remain free from injury will improve 12/05/2022 0358 by Vallery Ridge, RN Outcome: Progressing 12/05/2022 0100 by Vallery Ridge, RN Outcome: Progressing   Problem: Pain Managment: Goal: General experience of comfort will improve 12/05/2022 0358 by Vallery Ridge, RN Outcome: Progressing 12/05/2022 0100 by Vallery Ridge, RN Outcome: Progressing

## 2022-12-05 NOTE — Progress Notes (Signed)
[facility] Progress Note  Patient: Darryl Gilbert    ZOX:[id number]  DOA: 12/02/2022     Date of Service: the patient was seen and examined on 12/05/2022  Chief Complaint  Patient presents with   Abdominal Pain   Chest Pain   Brief hospital course:  SAHMIR BASSETTE is a 67 y.o. male with PMH of CAD, HTN, CHF, paroxysmal A-fib, cigarette smoking, as reviewed from EMR, presented at [facility] ED with complaining of epigastric abdominal pain radiating to both sides of diaphragm, started in the morning at rest.  Patient had nausea, diaphoresis and shortness of breath.  He called PCP and was recommended to go to ED. ED workup: Bradycardia heart rate 52, tachypneic respiratory 22, BP stable, saturating well on room air.  Creatinine 1.35, slightly elevated, transaminitis, elevated bilirubin as well. Troponin negative, WBC within normal range.  No anemia.  CT A/P: 1. Haziness around the gallbladder, without calcified stone or biliary dilatation. Acute cholecystitis not confirmed by previously performed ultrasound. Correlation with nuclear medicine hepatobiliary imaging could be obtained if indicated. 2. Diverticular disease of the left colon without acute bowel wall thickening. 3. Diffuse aneurysmal dilatation of the infrarenal abdominal aorta up to 4.1 cm. Recommend follow-up every 12 months and vascular consultation. This recommendation follows ACR consensus guidelines: White Paper of the ACR Incidental Findings Committee II on Vascular Findings. J Am Coll Radiol 2013; 81:191-478. 4. Aortic atherosclerosis.  MRCP: Reviewed as below  General surgery was consulted, TRH consulted for admission and further management as below.  Assessment and Plan:  # Acute cholecystitis  Presented with abdominal pain Trend LFTs Continue IV antibiotics Zosyn Started clear liquid diet Continue as needed medication for pain control General Surgery consulted, reviewed MRCP, patient is not a candidate for ERCP.   Continue conservative management for now, gallbladder is contracted so cannot do cholecystostomy by IR at this time.  Patient will need definitive treatment as an outpatient in 2 months Patient will need cardiac clearance to stop antiplatelet therapy before surgical intervention. Repeat ultrasound and HIDA scan on Monday Keep n.p.o. after midnight   # CAD, CHF and hypertension Continue aspirin and Plavix Held Lipitor secondary to transaminitis 8/18 resumed Toprol-XL 12.5 mg p.o. daily and Entresto 1 tablet p.o. twice daily as per cardiology Cardiology consult appreciated    # Paroxysmal atrial fibrillation, not on [facility] Currently patient's sinus bradycardic  continue aspirin 81, and Plavix 75 mg Lopressor held due to bradycardia, cardiology was consulted, resumed Toprol-XL 12.5 mg p.o. daily on 8/18 due to PVCs and PACs. Monitor heart rate and treat accordingly.  # COPD, continue Anora Ellipta inhaler # Depression, continue Wellbutrin  # Abdominal aortic aneurysm, infrarenal, 4.1 cm. Patient was advised to follow-up with vascular surgery as an outpatient.  Body mass index is 26.91 kg/m.  Interventions:  Diet: FLD DVT Prophylaxis: Subcutaneous Lovenox   Advance goals of care discussion: Full code  Family Communication: family was present at bedside, at the time of interview.  The pt provided permission to discuss medical plan with the family. Opportunity was given to ask question and all questions were answered satisfactorily.  8/17 discussed with patient's wife at bedside.   Disposition:  Pt is from Home, admitted with acute cholecystitis, still has abdominal pain, abnormal labs and on clear liquid diet, which precludes a safe discharge. Discharge to home, when cleared by general surgery, may need few days to stabilize.  Subjective: No significant events overnight, today patient's abdominal pain is little better, 4/10.  Denied any chest pain or palpitations, no any other  active issues.  Patient would like to keep full liquid diet today, does not feel that he can tolerate advance diet.   Physical Exam: General: NAD, lying comfortably Appear in no distress, affect appropriate Eyes: PERRLA ENT: Oral Mucosa Clear, moist  Neck: no JVD,  Cardiovascular: S1 and S2 Present, no Murmur,  Respiratory: good respiratory effort, Bilateral Air entry equal and Decreased, no Crackles, no wheezes Abdomen: Bowel Sound present, Soft, epigastric and RUQ  tenderness,  Skin: no rashes Extremities: no Pedal edema, no calf tenderness Neurologic: without any new focal findings Gait not checked due to patient safety concerns  Vitals:   12/04/22 1940 12/05/22 0430 12/05/22 0832 12/05/22 0953  BP: 128/72 114/83 (!) 124/98   Pulse: (!) 50 (!) 46 (!) 50 95  Resp: 20 16 16    Temp: 97.8 F (36.6 C) 97.6 F (36.4 C) 97.6 F (36.4 C)   TempSrc: Oral Oral    SpO2: 94% 94% 96%   Weight:  90 kg    Height:        Intake/Output Summary (Last 24 hours) at 12/05/2022 1456 Last data filed at 12/05/2022 1056 Gross per 24 hour  Intake 1140 ml  Output --  Net 1140 ml   Filed Weights   12/03/22 0500 12/04/22 0451 12/05/22 0430  Weight: 92.2 kg 90.8 kg 90 kg    Data Reviewed: I have personally reviewed and interpreted daily labs, tele strips, imagings as discussed above. I reviewed all nursing notes, pharmacy notes, vitals, pertinent old records I have discussed plan of care as described above with RN and patient/family.  CBC: Recent Labs  Lab 12/02/22 1134 12/03/22 0657 12/04/22 0435 12/05/22 0417  WBC 6.4 4.6 5.3 4.1  HGB 14.9 13.6 13.2 13.4  HCT 44.7 40.3 39.0 38.3*  MCV 92.2 92.4 89.9 90.1  PLT 209 163 164 161   Basic Metabolic Panel: Recent Labs  Lab 12/02/22 1134 12/03/22 0657 12/04/22 0435 12/05/22 0417  NA 137 139 138 138  K 3.9 4.2 3.9 3.6  CL 103 109 108 107  CO2 24 23 23 23   GLUCOSE 120* 97 100* 90  BUN 18 16 12 9   CREATININE 1.43* 1.35* 1.37*  1.48*  CALCIUM 9.5 8.7* 8.9 8.8*  MG  --   --  2.1 2.2  PHOS  --   --  3.5 3.6    Studies: No results found.  Scheduled Meds:  aspirin  81 mg Oral Daily   buPROPion  300 mg Oral Daily   clopidogrel  75 mg Oral Daily   enoxaparin (LOVENOX) injection  40 mg Subcutaneous QPM   metoprolol succinate  12.5 mg Oral Daily   pantoprazole  40 mg Oral Daily   sacubitril-valsartan  1 tablet Oral BID   sodium chloride flush  3 mL Intravenous Q12H   umeclidinium-vilanterol  1 puff Inhalation Daily   Continuous Infusions:  sodium chloride Stopped (12/03/22 2201)   piperacillin-tazobactam (ZOSYN)  IV 3.375 g (12/05/22 1255)   PRN Meds: sodium chloride, acetaminophen **OR** acetaminophen, HYDROcodone-acetaminophen, HYDROmorphone (DILAUDID) injection, ondansetron (ZOFRAN) IV  Time spent: 35 minutes  Author: Gillis Santa. MD [facility] 12/05/2022 2:56 PM  To reach On-call, see care teams to locate the attending and reach out to them via [email or web address]. If 7PM-7AM, please contact night-coverage If you still have difficulty reaching the attending provider, please page the [facility] (Director on Call) for [facility] on amion for assistance.

## 2022-12-05 NOTE — Progress Notes (Signed)
Order received to renew telemetry

## 2022-12-06 ENCOUNTER — Inpatient Hospital Stay: Admit: 2022-12-06 | Payer: 59

## 2022-12-06 ENCOUNTER — Inpatient Hospital Stay: Payer: 59

## 2022-12-06 DIAGNOSIS — R1011 Right upper quadrant pain: Secondary | ICD-10-CM | POA: Diagnosis not present

## 2022-12-06 DIAGNOSIS — K810 Acute cholecystitis: Secondary | ICD-10-CM | POA: Diagnosis not present

## 2022-12-06 LAB — BASIC METABOLIC PANEL
Anion gap: 6 (ref 5–15)
BUN: 9 mg/dL (ref 8–23)
CO2: 24 mmol/L (ref 22–32)
Calcium: 9 mg/dL (ref 8.9–10.3)
Chloride: 108 mmol/L (ref 98–111)
Creatinine, Ser: 1.37 mg/dL — ABNORMAL HIGH (ref 0.61–1.24)
GFR, Estimated: 57 mL/min — ABNORMAL LOW (ref >60)
Glucose, Bld: 86 mg/dL (ref 70–99)
Potassium: 3.8 mmol/L (ref 3.5–5.1)
Sodium: 138 mmol/L (ref 135–145)

## 2022-12-06 LAB — CBC
HCT: 41.7 % (ref 39.0–52.0)
Hemoglobin: 14.2 g/dL (ref 13.0–17.0)
MCH: 31 pg (ref 26.0–34.0)
MCHC: 34.1 g/dL (ref 30.0–36.0)
MCV: 91 fL (ref 80.0–100.0)
Platelets: 173 10*3/uL (ref 150–400)
RBC: 4.58 MIL/uL (ref 4.22–5.81)
RDW: 13.3 % (ref 11.5–15.5)
WBC: 4.9 10*3/uL (ref 4.0–10.5)
nRBC: 0 % (ref 0.0–0.2)

## 2022-12-06 LAB — HEPATIC FUNCTION PANEL
ALT: 80 U/L — ABNORMAL HIGH (ref 0–44)
AST: 39 U/L (ref 15–41)
Albumin: 3.6 g/dL (ref 3.5–5.0)
Alkaline Phosphatase: 103 U/L (ref 38–126)
Bilirubin, Direct: 0.2 mg/dL (ref 0.0–0.2)
Indirect Bilirubin: 0.9 mg/dL (ref 0.3–0.9)
Total Bilirubin: 1.1 mg/dL (ref 0.3–1.2)
Total Protein: 6.7 g/dL (ref 6.5–8.1)

## 2022-12-06 LAB — PHOSPHORUS: Phosphorus: 3.4 mg/dL (ref 2.5–4.6)

## 2022-12-06 LAB — MAGNESIUM: Magnesium: 2.5 mg/dL — ABNORMAL HIGH (ref 1.7–2.4)

## 2022-12-06 MED ORDER — HYDROMORPHONE HCL 1 MG/ML IJ SOLN: 0.5000 mg | Freq: Three times a day (TID) | INTRAMUSCULAR | Status: DC | PRN

## 2022-12-06 MED ORDER — TECHNETIUM TC 99M MEBROFENIN IV KIT
5.0000 | PACK | Freq: Once | INTRAVENOUS | Status: AC | PRN
  Administered 2022-12-06: 4.91 via INTRAVENOUS

## 2022-12-06 NOTE — Plan of Care (Signed)

## 2022-12-06 NOTE — Care Management Important Message (Signed)
Important Message  Patient Details  Name: Darryl Gilbert MRN: [id number] Date of Birth: 10-Aug-1955   Medicare Important Message Given:  Yes     [name] 12/06/2022, 11:09 AM

## 2022-12-06 NOTE — Progress Notes (Signed)
[facility] Progress Note  Patient: Darryl Gilbert    WER:[id number]  DOA: 12/02/2022     Date of Service: the patient was seen and examined on 12/06/2022  Chief Complaint  Patient presents with   Abdominal Pain   Chest Pain   Brief hospital course:  Darryl Gilbert is a 67 y.o. male with PMH of CAD, HTN, CHF, paroxysmal A-fib, cigarette smoking, as reviewed from EMR, presented at [facility] ED with complaining of epigastric abdominal pain radiating to both sides of diaphragm, started in the morning at rest.  Patient had nausea, diaphoresis and shortness of breath.  He called PCP and was recommended to go to ED. ED workup: Bradycardia heart rate 52, tachypneic respiratory 22, BP stable, saturating well on room air.  Creatinine 1.35, slightly elevated, transaminitis, elevated bilirubin as well. Troponin negative, WBC within normal range.  No anemia.  CT A/P: 1. Haziness around the gallbladder, without calcified stone or biliary dilatation. Acute cholecystitis not confirmed by previously performed ultrasound. Correlation with nuclear medicine hepatobiliary imaging could be obtained if indicated. 2. Diverticular disease of the left colon without acute bowel wall thickening. 3. Diffuse aneurysmal dilatation of the infrarenal abdominal aorta up to 4.1 cm. Recommend follow-up every 12 months and vascular consultation.   MRCP: Reviewed as below  General surgery was consulted, TRH consulted for admission and further management as below.  Assessment and Plan:  # Acute cholecystitis  Presented with abdominal pain Continue IV antibiotics Zosyn Continue as needed medication for pain control General Surgery consulted, reviewed MRCP, patient is not a candidate for ERCP.  Continue conservative management for now, gallbladder is contracted so cannot do cholecystostomy by IR at this time.  Patient will need definitive treatment as an outpatient in 2 months Patient will need cardiac clearance to stop  antiplatelet therapy before surgical intervention. Repeat ultrasound and HIDA scan today, patient is n.p.o. since midnight We will gradually advance diet after HIDA scan LFTs are trending down   # CAD, CHF and hypertension Continue aspirin and Plavix Held Lipitor secondary to transaminitis 8/18 resumed Toprol-XL 12.5 mg p.o. daily and Entresto 1 tablet p.o. twice daily as per cardiology Cardiology consult appreciated    # Paroxysmal atrial fibrillation, not on [facility] Currently patient's sinus bradycardic  continue aspirin 81, and Plavix 75 mg Lopressor held due to bradycardia, cardiology was consulted, resumed Toprol-XL 12.5 mg p.o. daily on 8/18 due to PVCs and PACs. Monitor heart rate and treat accordingly.  # COPD, continue Anora Ellipta inhaler # Depression, continue Wellbutrin  # Abdominal aortic aneurysm, infrarenal, 4.1 cm. Patient was advised to follow-up with vascular surgery as an outpatient.  Body mass index is 26.91 kg/m.  Interventions:  Diet: FLD DVT Prophylaxis: Subcutaneous Lovenox   Advance goals of care discussion: Full code  Family Communication: family was present at bedside, at the time of interview.  The pt provided permission to discuss medical plan with the family. Opportunity was given to ask question and all questions were answered satisfactorily.  8/17 discussed with patient's wife at bedside.   Disposition:  Pt is from Home, admitted with acute cholecystitis, still has abdominal pain, abnormal labs and on clear liquid diet, which precludes a safe discharge. Discharge to home, when cleared by general surgery, most likely tomorrow a.m.  Subjective: No significant events overnight,) pain is 3/10, very minimal, patient is feeling improvement.  Patient was seen after ultrasound, tolerated well.  After HIDA scan will resume diet.  Most likely discharge plan tomorrow  a.m.   Physical Exam: General: NAD, lying comfortably Appear in no distress, affect  appropriate Eyes: PERRLA ENT: Oral Mucosa Clear, moist  Neck: no JVD,  Cardiovascular: S1 and S2 Present, no Murmur,  Respiratory: good respiratory effort, Bilateral Air entry equal and Decreased, no Crackles, no wheezes Abdomen: Bowel Sound present, Soft, epigastric and RUQ  tenderness,  Skin: no rashes Extremities: no Pedal edema, no calf tenderness Neurologic: without any new focal findings Gait not checked due to patient safety concerns  Vitals:   12/05/22 1601 12/05/22 1934 12/06/22 0348 12/06/22 0800  BP: (!) 142/77 (!) 140/77 137/67 (!) 134/96  Pulse: (!) 50 (!) 51 (!) 46 (!) 46  Resp: 18 16 20 18   Temp: 97.6 F (36.4 C) (!) 97.5 F (36.4 C) 98 F (36.7 C) (!) 97.5 F (36.4 C)  TempSrc:  Oral Oral   SpO2: 98% 97% 92% 98%  Weight:   90 kg   Height:        Intake/Output Summary (Last 24 hours) at 12/06/2022 1450 Last data filed at 12/05/2022 2101 Gross per 24 hour  Intake 1165.14 ml  Output --  Net 1165.14 ml   Filed Weights   12/04/22 0451 12/05/22 0430 12/06/22 0348  Weight: 90.8 kg 90 kg 90 kg    Data Reviewed: I have personally reviewed and interpreted daily labs, tele strips, imagings as discussed above. I reviewed all nursing notes, pharmacy notes, vitals, pertinent old records I have discussed plan of care as described above with RN and patient/family.  CBC: Recent Labs  Lab 12/02/22 1134 12/03/22 0657 12/04/22 0435 12/05/22 0417 12/06/22 0508  WBC 6.4 4.6 5.3 4.1 4.9  HGB 14.9 13.6 13.2 13.4 14.2  HCT 44.7 40.3 39.0 38.3* 41.7  MCV 92.2 92.4 89.9 90.1 91.0  PLT 209 163 164 161 173   Basic Metabolic Panel: Recent Labs  Lab 12/02/22 1134 12/03/22 0657 12/04/22 0435 12/05/22 0417 12/06/22 0508  NA 137 139 138 138 138  K 3.9 4.2 3.9 3.6 3.8  CL 103 109 108 107 108  CO2 24 23 23 23 24   GLUCOSE 120* 97 100* 90 86  BUN 18 16 12 9 9   CREATININE 1.43* 1.35* 1.37* 1.48* 1.37*  CALCIUM 9.5 8.7* 8.9 8.8* 9.0  MG  --   --  2.1 2.2 2.5*  PHOS   --   --  3.5 3.6 3.4    Studies: No results found.  Scheduled Meds:  aspirin  81 mg Oral Daily   buPROPion  300 mg Oral Daily   clopidogrel  75 mg Oral Daily   enoxaparin (LOVENOX) injection  40 mg Subcutaneous QPM   metoprolol succinate  12.5 mg Oral Daily   pantoprazole  40 mg Oral Daily   sacubitril-valsartan  1 tablet Oral BID   sodium chloride flush  3 mL Intravenous Q12H   umeclidinium-vilanterol  1 puff Inhalation Daily   Continuous Infusions:  sodium chloride 10 mL/hr at 12/05/22 2205   piperacillin-tazobactam (ZOSYN)  IV 3.375 g (12/06/22 0630)   PRN Meds: sodium chloride, acetaminophen **OR** acetaminophen, HYDROcodone-acetaminophen, HYDROmorphone (DILAUDID) injection, ondansetron (ZOFRAN) IV  Time spent: 35 minutes  Author: Gillis Santa. MD [facility] 12/06/2022 2:50 PM  To reach On-call, see care teams to locate the attending and reach out to them via [email or web address]. If 7PM-7AM, please contact night-coverage If you still have difficulty reaching the attending provider, please page the [facility] (Director on Call) for [facility] on amion for assistance.

## 2022-12-06 NOTE — Progress Notes (Incomplete)
Rounding Note    Patient Name: Darryl Gilbert Date of Encounter: 12/06/2022  [facility] Cardiologist: Lorine Bears, MD   Subjective   ***  Inpatient Medications    Scheduled Meds:  aspirin  81 mg Oral Daily   buPROPion  300 mg Oral Daily   clopidogrel  75 mg Oral Daily   enoxaparin (LOVENOX) injection  40 mg Subcutaneous QPM   metoprolol succinate  12.5 mg Oral Daily   pantoprazole  40 mg Oral Daily   sacubitril-valsartan  1 tablet Oral BID   sodium chloride flush  3 mL Intravenous Q12H   umeclidinium-vilanterol  1 puff Inhalation Daily   Continuous Infusions:  sodium chloride 10 mL/hr at 12/05/22 2205   piperacillin-tazobactam (ZOSYN)  IV 3.375 g (12/06/22 0630)   PRN Meds: sodium chloride, acetaminophen **OR** acetaminophen, HYDROcodone-acetaminophen, HYDROmorphone (DILAUDID) injection, ondansetron (ZOFRAN) IV   Vital Signs    Vitals:   12/05/22 1601 12/05/22 1934 12/06/22 0348 12/06/22 0800  BP: (!) 142/77 (!) 140/77 137/67 (!) 134/96  Pulse: (!) 50 (!) 51 (!) 46 (!) 46  Resp: 18 16 20 18   Temp: 97.6 F (36.4 C) (!) 97.5 F (36.4 C) 98 F (36.7 C) (!) 97.5 F (36.4 C)  TempSrc:  Oral Oral   SpO2: 98% 97% 92% 98%  Weight:   90 kg   Height:        Intake/Output Summary (Last 24 hours) at 12/06/2022 1108 Last data filed at 12/05/2022 2101 Gross per 24 hour  Intake 1165.14 ml  Output --  Net 1165.14 ml      12/06/2022    3:48 AM 12/05/2022    4:30 AM 12/04/2022    4:51 AM  Last 3 Weights  Weight (lbs) 198 lb 6.6 oz 198 lb 6.6 oz 200 lb 2.8 oz  Weight (kg) 90 kg 90 kg 90.8 kg      Telemetry    *** - Personally Reviewed  ECG    *** - Personally Reviewed  Physical Exam  *** GEN: No acute distress.   Neck: No JVD Cardiac: RRR, no murmurs, rubs, or gallops.  Respiratory: Clear to auscultation bilaterally. GI: Soft, nontender, non-distended  MS: No edema; No deformity. Neuro:  Nonfocal  Psych: Normal affect   Labs    High  Sensitivity Troponin:   Recent Labs  Lab 12/02/22 1134 12/02/22 1333  TROPONINIHS 10 9     Chemistry Recent Labs  Lab 12/04/22 0435 12/05/22 0417 12/06/22 0508  NA 138 138 138  K 3.9 3.6 3.8  CL 108 107 108  CO2 23 23 24   GLUCOSE 100* 90 86  BUN 12 9 9   CREATININE 1.37* 1.48* 1.37*  CALCIUM 8.9 8.8* 9.0  MG 2.1 2.2 2.5*  PROT 6.6 6.4* 6.7  ALBUMIN 3.6 3.5 3.6  AST 49* 51* 39  ALT 110* 105* 80*  ALKPHOS 91 112 103  BILITOT 1.2 1.0 1.1  GFRNONAA 57* 52* 57*  ANIONGAP 7 8 6     Lipids No results for input(s): "CHOL", "TRIG", "HDL", "LABVLDL", "LDLCALC", "CHOLHDL" in the last 168 hours.  Hematology Recent Labs  Lab 12/04/22 0435 12/05/22 0417 12/06/22 0508  WBC 5.3 4.1 4.9  RBC 4.34 4.25 4.58  HGB 13.2 13.4 14.2  HCT 39.0 38.3* 41.7  MCV 89.9 90.1 91.0  MCH 30.4 31.5 31.0  MCHC 33.8 35.0 34.1  RDW 13.4 13.4 13.3  PLT 164 161 173   Thyroid No results for input(s): "TSH", "FREET4" in the last  168 hours.  BNPNo results for input(s): "BNP", "PROBNP" in the last 168 hours.  DDimer No results for input(s): "DDIMER" in the last 168 hours.   Radiology    No results found.  Cardiac Studies   Zio patch 09/2022: HR 38 - 179 bpm, average 53 bpm. 1 NSVT lasting 4 beats. 30 nonsustained SVT, longest 26.5 seconds.  Frequent supraventricular ectopy, 8.4%. Rare ventricular ectopy. No atrial fibrillation. No sustained arrhythmias.   Improved burden of SVT and supraventricular ectopy compared to prior monitor. __________   Zio patch 07/2022: HR 46 - 226, average 65 bpm. 50 nonsustained SVT, longest 13.8 seconds with an average rate of 159 bpm. Frequent supraventricular ectopy, 17.3% Occasional ventricular ectopy, 2.4%   HR 41 - 245, average 61 bpm. 1 nonsustained VT lasting 5 beats 196 nonsustained SVT, longest 22.1 seconds with an average rate of 166 bpm. <1% AF burden Frequent supraventricular ectopy, 17.3% Occasional ventricular ectopy, 2.8% __________    Eugenie Birks MPI 05/06/2022: Pharmacological myocardial perfusion imaging study with no significant  ischemia Moderate-sized fixed perfusion defect in the basal to mid lateral wall consistent with prior MI Hypokinesis of the basal to mid lateral wall, EF estimated at 48%, GI uptake artifact noted No EKG changes concerning for ischemia at peak stress or in recovery. CT attenuation correction images with coronary disease and stenting in the left circumflex, mild coronary calcification in the LAD, no significant aortic atherosclerosis Low risk scan __________   2D echo 04/27/2022: 1. Left ventricular ejection fraction, by estimation, is 35 to 40%. The  left ventricle has moderately decreased function. The left ventricle  demonstrates global hypokinesis with severe hypokinesis of the inferior  wall. The left ventricular internal  cavity size was mildly dilated. Left ventricular diastolic parameters are  consistent with Grade II diastolic dysfunction (pseudonormalization).   2. Right ventricular systolic function is normal. The right ventricular  size is normal.   3. The mitral valve is normal in structure. Mild to moderate mitral valve  regurgitation. No evidence of mitral stenosis.   4. The aortic valve has an indeterminant number of cusps. Aortic valve  regurgitation is not visualized. No aortic stenosis is present.   5. The inferior vena cava is normal in size with greater than 50%  respiratory variability, suggesting right atrial pressure of 3 mmHg.   Comparison(s): 06/25/21-EF 45%.  __________   Limited echo 06/25/2021: 1. Left ventricular ejection fraction, by estimation, is 45%. The left  ventricle has mildly decreased function. The left ventricle has no  regional wall motion abnormalities. There is mild left ventricular  hypertrophy. Left ventricular diastolic  parameters are consistent with Grade II diastolic dysfunction  (pseudonormalization).   2. Right ventricular systolic function  is normal.   3. The mitral valve is normal in structure. No evidence of mitral valve  regurgitation.   4. The aortic valve is tricuspid. Aortic valve regurgitation is not  visualized.  __________   Luci Bank patch 05/2021: HR 40 - 218 bpm, average 59 bpm. 5 NSVT, longest lasting 10 beats at an average rate of 103 bpm. 109 SVT episodes, longest lasting 29.4 seconds with an average rate of 100 bpm. Frequent supraventricualr ectopy, 11.6%. Occasional ventricular ectopy, 2.9%. __________   Luci Bank patch 03/2021: Patient had a min HR of 42 bpm, max HR of 200 bpm, and avg HR of 62 bpm.  Predominant underlying rhythm was Sinus Rhythm. QRS morphology changes were present throughout recording.  5 Ventricular Tachycardia runs occurred, the run with the fastest  interval lasting 12 beats with a max rate of 190 bpm (avg 156 bpm); the run with the fastest interval was also the longest. Some episodes of Ventricular Tachycardia may be Supraventricular Tachycardia with possible aberrancy.  119 Supraventricular Tachycardia runs occurred, the run with the fastest interval lasting 7 beats with a max rate of 200 bpm, the longest lasting 13.7 secs with an avg rate of 122 bpm. Ectopic Atrial Rhythm was present. Ventricular Tachycardia and Supraventricular Tachycardia were detected within +/- 45 seconds of symptomatic patient event(s). Frequent PACs with a burden of 15% and occasional PVCs. __________   [facility] 02/23/2021:   1st Mrg lesion is 50% stenosed.   Dist LAD lesion is 80% stenosed.   LPAV lesion is 50% stenosed.   Ost RCA to Prox RCA lesion is 40% stenosed.   Non-stenotic Prox Cx to Mid Cx lesion was previously treated.   There is moderate left ventricular systolic dysfunction.   LV end diastolic pressure is normal.   The left ventricular ejection fraction is 35-45% by visual estimate.   1.  Widely patent left circumflex stent with no significant restenosis.  The RCA is codominant with high anterior takeoff.  The  70% ostial stenosis that was noted on previous catheterization appears to be better and no more than 40%.  Suspect previous catheter induced spasm.  The proximal portion of the right coronary artery has an ectatic segment causing some sluggish flow in the vessel which was noted on previous angiography as well. 2.  Moderately reduced LV systolic function with an EF of 35 to 40%.  Normal left ventricular end-diastolic pressure at 7 mmHg.   Recommendations: Recommend continued medical therapy for coronary artery disease. The patient was noted to be bradycardic on presentation with heart rate in the low 40s.  Thus, I elected to discontinue metoprolol. The exact etiology for his continued shortness of breath is not entirely clear but there could be a component of physical deconditioning.  In addition, he does have cardiomyopathy and we will have to treat him medically for heart failure.  His LVEDP was normal. ___________   Limited echo 12/22/2020: 1. Limited study   2. Left ventricular ejection fraction, by estimation, is 45 to 50%. The  left ventricle has mildly decreased function. The left ventricle  demonstrates regional wall motion abnormalities (Hypokinesis of the  inferolateral wall). There is mild left  ventricular hypertrophy.   3. Right ventricular systolic function is normal. The right ventricular  size is normal. Tricuspid regurgitation signal is inadequate for assessing  PA pressure.  __________   2D echo 12/21/2020: 1. Left ventricular ejection fraction, by estimation, is 50%. The left  ventricle has low normal function. The left ventricle demonstrates  regional wall motion abnormalities (see scoring diagram/findings for  description). There is mild left ventricular  hypertrophy. Left ventricular diastolic parameters are consistent with  Grade II diastolic dysfunction (pseudonormalization). There is moderate  hypokinesis of the left ventricular, entire inferolateral wall.   2. Right  ventricular systolic function is normal. The right ventricular  size is normal.   3. The mitral valve is normal in structure. No evidence of mitral valve  regurgitation.   4. The aortic valve is tricuspid. Aortic valve regurgitation is not  visualized.  __________   [facility] 12/20/2020:   Ost RCA to Prox RCA lesion is 70% stenosed.   Prox Cx to Mid Cx lesion is 100% stenosed.   1st Mrg lesion is 60% stenosed.   A drug-eluting stent was successfully  placed using a STENT ONYX FRONTIER 3.0X22.   Post intervention, there is a 0% residual stenosis.   There is moderate left ventricular systolic dysfunction.   LV end diastolic pressure is moderately elevated.   The left ventricular ejection fraction is 35-45% by visual estimate.   1.  Codominant coronary arteries with thrombotic occlusion of the proximal left circumflex which is the culprit for inferior STEMI.  There is also 70% stenosis in ostial right coronary artery which has high anterior takeoff.  Catheter induced spasm in the ostial right coronary artery cannot be completely excluded. 2.  Moderately reduced LV systolic function with segmental wall motion abnormalities in the left circumflex distribution.  Moderately elevated left ventricular end-diastolic pressure at 22 mmHg. 3.  Successful angioplasty and drug-eluting stent placement to the left circumflex.   Recommendations: Dual antiplatelet therapy for at least 12 months. Aggressive treatment of risk factors and smoking cessation. The patient had atrial fibrillation on presentation but converted sinus rhythm at the end of the case with amiodarone drip.  Continue amiodarone drip for now and monitor rhythm closely.  Patient Profile     67 y.o. 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  - PTA BB initially held for bradycardia - tele shows NSR with frequent PACs - Toprol 12.5mg  restarted for suppression of atrial arrythmia - Recommend EP eval, they can see tomorrow  pSVT with frequent PACs - patient had significant NSVT with frequent atrial ectopy on low dose Toprol - Toprol restarted as above  CAD - no plans for further ischemic testing - continue Aspirin and Plavix - statin held for transaminitis  HFrEF 2/2 ICM - euvolemic on exam - PTA Entresto 24/26mg BID - h/o bradycardia. On toprol 12.5mg  daily  HLD - LDL 58 - statin held for elevated LFTs  Lone Afib - in the setting of MI - no evidence of recurrence on multiple outpatient cardiac monitor  CKD stage 3 - renal function stable  Infrarenal AAA - diffuse aneurysmal dilation of infrarenal abdominal aorta measuring 4.1cm - OP follow-up  For questions or updates, please contact [facility] Please consult [email or web address] for contact info under        Signed, Jamile Sivils David Stall, PA-C  12/06/2022, 11:08 AM

## 2022-12-06 NOTE — Plan of Care (Signed)

## 2022-12-06 NOTE — Progress Notes (Signed)
[facility] SURGICAL PROGRESS NOTE (cpt [phone])  Hospital Day(s): 4.   Interval History: Patient seen and examined, no acute events or new complaints overnight. Patient reports continuation of intermittent episodes of excruciating RUQ abdominal pain.Analgesics help some but continues to return. He denied fever, chills, nausea, emesis. He remains without leukocytosis; 4.9K. Hgb to 14.2. Renal function normal; sCr - 1.37; UO - unmeasured. Mild hypermagnesemia to 2.5 otherwise no electrolyte derangements. He is Zosyn. He is NPO this morning for plan for HIDA.   Review of Systems:  Constitutional: denies fever, chills  HEENT: denies cough or congestion  Respiratory: denies any shortness of breath  Cardiovascular: denies chest pain or palpitations  Gastrointestinal: + abdominal pain, denied N/V Musculoskeletal: denies pain, decreased motor or sensation  Vital signs in last 24 hours: [min-max] current  Temp:  [97.5 F (36.4 C)-98 F (36.7 C)] 98 F (36.7 C) (08/19 0348) Pulse Rate:  [46-95] 46 (08/19 0348) Resp:  [16-20] 20 (08/19 0348) BP: (124-142)/(67-98) 137/67 (08/19 0348) SpO2:  [92 %-98 %] 92 % (08/19 0348) Weight:  [90 kg] 90 kg (08/19 0348)     Height: 6' (182.9 cm) Weight: 90 kg BMI (Calculated): 26.9   Intake/Output last 2 shifts:  08/18 0701 - 08/19 0700 In: 1945.1 [P.O.:1490; I.V.:147.9; IV Piggyback:307.3] Out: -    Physical Exam:  Constitutional: alert, cooperative and no distress  HENT: normocephalic without obvious abnormality  Eyes: PERRL, EOM's grossly intact and symmetric  Respiratory: breathing non-labored at rest  Cardiovascular: regular rate and sinus rhythm  Gastrointestinal: soft, endorses tenderness to palpation in RUQ, and non-distended, no rebound/guarding  Musculoskeletal: no edema or wounds, motor and sensation grossly intact, NT   Labs:     Latest Ref Rng & Units 12/06/2022    5:08 AM 12/05/2022    4:17 AM 12/04/2022    4:35 AM  CBC   WBC 4.0 - 10.5 K/uL 4.9  4.1  5.3   Hemoglobin 13.0 - 17.0 g/dL 60.4  54.0  98.1   Hematocrit 39.0 - 52.0 % 41.7  38.3  39.0   Platelets 150 - 400 K/uL 173  161  164       Latest Ref Rng & Units 12/06/2022    5:08 AM 12/05/2022    4:17 AM 12/04/2022    4:35 AM  CMP  Glucose 70 - 99 mg/dL 86  90  191   BUN 8 - 23 mg/dL 9  9  12    Creatinine 0.61 - 1.24 mg/dL 4.78  2.95  6.21   Sodium 135 - 145 mmol/L 138  138  138   Potassium 3.5 - 5.1 mmol/L 3.8  3.6  3.9   Chloride 98 - 111 mmol/L 108  107  108   CO2 22 - 32 mmol/L 24  23  23    Calcium 8.9 - 10.3 mg/dL 9.0  8.8  8.9   Total Protein 6.5 - 8.1 g/dL 6.7  6.4  6.6   Total Bilirubin 0.3 - 1.2 mg/dL 1.1  1.0  1.2   Alkaline Phos 38 - 126 U/L 103  112  91   AST 15 - 41 U/L 39  51  49   ALT 0 - 44 U/L 80  105  110      Imaging studies:  No new pertinent imaging studies; HIDA pending; RUQ [place] pending   Assessment/Plan: (ICD-10's: K81.0) 67 y.o. male with RUQ abdominal pain and LFT elevation with possible cholecystitis, complicated by history of STEMI and  need for Plavix/ASA               - Still with abdominal discomfort without significant improvement with conservative measures. Unfortunately, he has been on Plavix and ASA given his history of STEMI. This makes him a sub-optimal candidate for surgery at this time given bleeding risk. We will proceed with obtaining HIDA scan this morning to determine patency of cystic duct. If this is occluded, would likely need IR evaluation for percutaneous cholecystostomy tube placement. Gallbladder is also contracted on most recent imaging, so will also repeat RUQ [place]. We will continue to trial conservative management with IV Abx as long as he does well clinically.              - HIDA pending this morning/afternoon              - Follow up LFTs; normalized             - He needs to be NPO for HIDA             - Agree with IV Abx (Zosyn)             - Monitor abdominal examination; on-going bowel  function             - Pain control prn (Hold narcotics for HIDA); antiemetics prn             - Okay to mobilize             - Further management per primary service; we will follow    All of the above findings and recommendations were discussed with the patient, and the medical team, and all of patient's questions were answered to his expressed satisfaction.  -- Lynden Oxford, PA-C [facility] 12/06/2022, 7:54 AM M-F: 7am - 4pm

## 2022-12-07 DIAGNOSIS — R001 Bradycardia, unspecified: Secondary | ICD-10-CM | POA: Diagnosis not present

## 2022-12-07 DIAGNOSIS — K810 Acute cholecystitis: Secondary | ICD-10-CM | POA: Diagnosis not present

## 2022-12-07 DIAGNOSIS — I491 Atrial premature depolarization: Secondary | ICD-10-CM | POA: Diagnosis not present

## 2022-12-07 DIAGNOSIS — I5020 Unspecified systolic (congestive) heart failure: Secondary | ICD-10-CM

## 2022-12-07 DIAGNOSIS — R1011 Right upper quadrant pain: Secondary | ICD-10-CM | POA: Diagnosis not present

## 2022-12-07 LAB — CBC
HCT: 43.8 % (ref 39.0–52.0)
Hemoglobin: 15.1 g/dL (ref 13.0–17.0)
MCH: 31.1 pg (ref 26.0–34.0)
MCHC: 34.5 g/dL (ref 30.0–36.0)
MCV: 90.3 fL (ref 80.0–100.0)
Platelets: 197 10*3/uL (ref 150–400)
RBC: 4.85 MIL/uL (ref 4.22–5.81)
RDW: 13.7 % (ref 11.5–15.5)
WBC: 6.5 10*3/uL (ref 4.0–10.5)
nRBC: 0 % (ref 0.0–0.2)

## 2022-12-07 LAB — HEPATIC FUNCTION PANEL
ALT: 73 U/L — ABNORMAL HIGH (ref 0–44)
AST: 39 U/L (ref 15–41)
Albumin: 3.6 g/dL (ref 3.5–5.0)
Alkaline Phosphatase: 101 U/L (ref 38–126)
Bilirubin, Direct: 0.2 mg/dL (ref 0.0–0.2)
Indirect Bilirubin: 0.5 mg/dL (ref 0.3–0.9)
Total Bilirubin: 0.7 mg/dL (ref 0.3–1.2)
Total Protein: 7.1 g/dL (ref 6.5–8.1)

## 2022-12-07 LAB — BASIC METABOLIC PANEL
Anion gap: 5 (ref 5–15)
BUN: 13 mg/dL (ref 8–23)
CO2: 24 mmol/L (ref 22–32)
Calcium: 9.1 mg/dL (ref 8.9–10.3)
Chloride: 108 mmol/L (ref 98–111)
Creatinine, Ser: 1.39 mg/dL — ABNORMAL HIGH (ref 0.61–1.24)
GFR, Estimated: 56 mL/min — ABNORMAL LOW (ref >60)
Glucose, Bld: 113 mg/dL — ABNORMAL HIGH (ref 70–99)
Potassium: 4 mmol/L (ref 3.5–5.1)
Sodium: 137 mmol/L (ref 135–145)

## 2022-12-07 MED ORDER — AMOXICILLIN-POT CLAVULANATE 875-125 MG PO TABS: 1.0000 | ORAL_TABLET | Freq: Two times a day (BID) | ORAL | 0 refills | Status: AC

## 2022-12-07 NOTE — Progress Notes (Signed)
Darryl Gilbert to be D/C'd Home per MD order.  Discussed prescriptions and follow up appointments with the patient. Prescriptions given to patient, medication list explained in detail. Pt verbalized understanding.  Allergies as of 12/07/2022       Reactions   Amiodarone Other (See Comments)   QT prolongation in the context of inferior STEMI and IV amiodarone use. Avoid all QT prolonging medications.         Medication List     TAKE these medications    amoxicillin-clavulanate 875-125 MG tablet Commonly known as: AUGMENTIN Take 1 tablet by mouth 2 (two) times daily for 3 days.   Anoro Ellipta 62.5-25 MCG/ACT Aepb Generic drug: umeclidinium-vilanterol Inhale 1 puff into the lungs daily.   aspirin 81 MG chewable tablet Chew 1 tablet (81 mg total) by mouth daily.   atorvastatin 80 MG tablet Commonly known as: LIPITOR Take 1 tablet (80 mg total) by mouth daily.   buPROPion 300 MG 24 hr tablet Commonly known as: WELLBUTRIN XL Take 300 mg by mouth daily.   clopidogrel 75 MG tablet Commonly known as: PLAVIX Take 1 tablet (75 mg total) by mouth daily.   Entresto 24-26 MG Generic drug: sacubitril-valsartan Take 1 tablet by mouth 2 (two) times daily.   esomeprazole 20 MG capsule Commonly known as: NEXIUM Take 1 capsule (20 mg total) by mouth daily at 12 noon.   furosemide 20 MG tablet Commonly known as: LASIX Take 1 tablet (20 mg total) by mouth daily.   HYDROcodone-acetaminophen 10-325 MG tablet Commonly known as: NORCO Take 1 tablet by mouth 3 (three) times daily as needed.   metoprolol succinate 25 MG 24 hr tablet Commonly known as: Toprol XL Take 0.5 tablets (12.5 mg total) by mouth at bedtime.   nitroGLYCERIN 0.4 MG SL tablet Commonly known as: NITROSTAT Place 1 tablet (0.4 mg total) under the tongue every 5 (five) minutes as needed for chest pain.   potassium chloride 10 MEQ tablet Commonly known as: KLOR-CON Take 1 tablet (10 mEq total) by mouth daily.    tamsulosin 0.4 MG Caps capsule Commonly known as: FLOMAX Take 0.4 mg by mouth daily.        Vitals:   12/07/22 0450 12/07/22 0804  BP: 129/69 137/81  Pulse: (!) 51 (!) 49  Resp: 18 18  Temp: 98 F (36.7 C) 98.2 F (36.8 C)  SpO2: 95% 95%    Skin clean, dry and intact without evidence of skin break down, no evidence of skin tears noted. IV catheter discontinued intact. Site without signs and symptoms of complications. Dressing and pressure applied. Pt denies pain at this time. No complaints noted.  An After Visit Summary was printed and given to the patient. Patient escorted via WC, and D/C home via private auto.  Karle Desrosier C. Jilda Roche

## 2022-12-07 NOTE — Consult Note (Addendum)
ELECTROPHYSIOLOGY CONSULT NOTE    Patient ID: Darryl Gilbert MRN: [id number], DOB/AGE: [birth year] 67 y.o.  Admit date: 12/02/2022 Date of Consult: 12/07/2022  Primary Physician: Dortha Kern, MD Primary Cardiologist: Lorine Bears, MD  Electrophysiologist: Dr. Lalla Brothers   Referring Provider: Dr. Lucianne Muss  Patient Profile: Darryl Gilbert is a 67 y.o. 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 Lucianne Muss.  HPI:  Darryl Gilbert is a 67 y.o. male with PMH as above, well known to EP service. Multiple beta blockers have been trialed to reduce ectopy, limited by bradycardia and side effects. He has been best controlled on 12.5mg  toprol daily.  He presented to [facility] with R upper gastric pain found to have acute cholecystitis s/p MRCP. Surgery planning to treat conservatively given his LFT improving, pain improvement along with u/s and HIDA negative.   On admission, his beta blocker was stopped. He subsequently began to feel palpitations with increased PAC and PVCs on tele. He also noted increased SOB which he states correlates to his increased ectopy. General cardiology restarted his home 12.5mg  toprol daily two days ago and he has felt at his baseline from a palpitation perspective since. He continues to have some abd discomfort, but is much improved.  He denies chest pain, dyspnea, PND, orthopnea, nausea, vomiting, dizziness, syncope, edema, weight gain, or early satiety.   Labs Potassium4.0 (08/20 4098) Magnesium  2.5* (08/19 1191) Creatinine, ser  1.39* (08/20 0618) PLT  197 (08/20 0618) HGB  15.1 (08/20 0618) WBC 6.5 (08/20 0618)  .    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    Hyperlipidemia LDL goal <70    Hypertension    Ischemic cardiomyopathy    Smoking      Surgical History:  Past Surgical History:  Procedure Laterality Date   COLONOSCOPY WITH PROPOFOL N/A 05/07/2022   Procedure: COLONOSCOPY WITH PROPOFOL;  Surgeon: Wyline Mood, MD;  Location: [facility] ENDOSCOPY;  Service: Gastroenterology;  Laterality: N/A;   CORONARY/GRAFT ACUTE MI REVASCULARIZATION N/A 12/20/2020   Procedure: Coronary/Graft Acute MI Revascularization;  Surgeon: [name], MD;  Location: [facility] INVASIVE CV LAB;  Service: Cardiovascular;  Laterality: N/A;   FRACTURE SURGERY     Left leg fracture, rod placement   LEFT HEART CATH AND CORONARY ANGIOGRAPHY N/A 12/20/2020   Procedure: LEFT HEART CATH AND CORONARY ANGIOGRAPHY;  Surgeon: [name], MD;  Location: [facility] INVASIVE CV LAB;  Service: Cardiovascular;  Laterality: N/A;   LEFT HEART CATH AND CORONARY ANGIOGRAPHY N/A 02/23/2021   Procedure: LEFT HEART CATH AND CORONARY ANGIOGRAPHY;  Surgeon: [name], MD;  Location: [facility] INVASIVE CV LAB;  Service: Cardiovascular;  Laterality: N/A;     Medications Prior to Admission  Medication Sig Dispense Refill Last Dose   aspirin 81 MG chewable tablet Chew 1 tablet (81 mg total) by mouth daily. 30 tablet 0 12/02/2022   atorvastatin (LIPITOR) 80 MG tablet Take 1 tablet (80 mg total) by mouth daily. 90 tablet 3 12/02/2022   buPROPion (WELLBUTRIN XL) 300 MG 24 hr tablet Take 300 mg by mouth daily.   12/02/2022  clopidogrel (PLAVIX) 75 MG tablet Take 1 tablet (75 mg total) by mouth daily. 90 tablet 3 12/02/2022   esomeprazole (NEXIUM) 20 MG capsule Take 1 capsule (20 mg total) by mouth daily at 12 noon. 30 capsule 6 12/02/2022   furosemide (LASIX) 20 MG tablet Take 1 tablet (20 mg total) by mouth daily. 90 tablet 3 12/02/2022   HYDROcodone-acetaminophen (NORCO) 10-325 MG tablet Take 1 tablet by mouth 3 (three) times daily as needed.   12/02/2022   metoprolol succinate  (TOPROL XL) 25 MG 24 hr tablet Take 0.5 tablets (12.5 mg total) by mouth at bedtime.   12/02/2022   nitroGLYCERIN (NITROSTAT) 0.4 MG SL tablet Place 1 tablet (0.4 mg total) under the tongue every 5 (five) minutes as needed for chest pain. 25 tablet 3 prn at unknown   potassium chloride (KLOR-CON) 10 MEQ tablet Take 1 tablet (10 mEq total) by mouth daily. 90 tablet 3 12/02/2022   tamsulosin (FLOMAX) 0.4 MG CAPS capsule Take 0.4 mg by mouth daily.   12/02/2022   umeclidinium-vilanterol (ANORO ELLIPTA) 62.5-25 MCG/ACT AEPB Inhale 1 puff into the lungs daily. 30 each 11 12/02/2022   sacubitril-valsartan (ENTRESTO) 24-26 MG Take 1 tablet by mouth 2 (two) times daily. (Patient not taking: Reported on 12/03/2022) 180 tablet 3 Not Taking    Inpatient Medications:   aspirin  81 mg Oral Daily   buPROPion  300 mg Oral Daily   clopidogrel  75 mg Oral Daily   enoxaparin (LOVENOX) injection  40 mg Subcutaneous QPM   metoprolol succinate  12.5 mg Oral Daily   pantoprazole  40 mg Oral Daily   sacubitril-valsartan  1 tablet Oral BID   sodium chloride flush  3 mL Intravenous Q12H   umeclidinium-vilanterol  1 puff Inhalation Daily    Allergies:  Allergies  Allergen Reactions   Amiodarone Other (See Comments)    QT prolongation in the context of inferior STEMI and IV amiodarone use. Avoid all QT prolonging medications.     History reviewed. No pertinent family history.   Physical Exam: Vitals:   12/06/22 1640 12/06/22 1931 12/07/22 0450 12/07/22 0804  BP: 125/83 (!) 147/80 129/69 137/81  Pulse: (!) 52 (!) 56 (!) 51 (!) 49  Resp: 18 19 18 18   Temp: 98 F (36.7 C) 98 F (36.7 C) 98 F (36.7 C) 98.2 F (36.8 C)  TempSrc:   Oral   SpO2: 96% 96% 95% 95%  Weight:      Height:        GEN- NAD, A&O x 3, normal affect HEENT: Normocephalic, atraumatic Lungs- CTAB, Normal effort.  Heart- Regular rate and rhythm, No M/G/R.  GI- Soft, NT, ND.  Extremities- No clubbing, cyanosis, or edema    Radiology/Studies: NM Hepato W/EF  Result Date: 12/06/2022 CLINICAL DATA:  Cholecystitis EXAM: NUCLEAR MEDICINE HEPATOBILIARY IMAGING WITH GALLBLADDER EF TECHNIQUE: Sequential images of the abdomen were obtained out to 60 minutes following intravenous administration of radiopharmaceutical. After oral ingestion of Ensure, gallbladder ejection fraction was determined. At 60 min, normal ejection fraction is greater than 33%. RADIOPHARMACEUTICALS:  4.9 mCi Tc-78m  Choletec IV COMPARISON:  12/02/2022 MRI FINDINGS: Satisfactory uptake of radiopharmaceutical from the blood pool. Biliary activity at 14 minutes. Gallbladder activity at 21 minutes. Bowel activity at 35 minutes. The patient experienced no new symptoms upon drinking the Ensure meal. Calculated gallbladder ejection fraction is 76%. (Normal gallbladder ejection fraction with Ensure is greater than 33%.) IMPRESSION: 1. Patent cystic duct and common bile duct without findings  of cholecystitis. 2. Normal gallbladder ejection fraction without findings of gallbladder dysfunction on today's exam. Electronically Signed   By: Gaylyn Rong M.D.   On: 12/06/2022 19:31   US Abdomen Limited RUQ (LIVER/GB)  Result Date: 12/06/2022 CLINICAL DATA:  Possible cholecystitis based on prior imaging. EXAM: ULTRASOUND ABDOMEN LIMITED RIGHT UPPER QUADRANT COMPARISON:  MRI and CT scans of December 02, 2022. FINDINGS: Gallbladder: No gallstones or wall thickening visualized. No sonographic Murphy sign noted by sonographer. Common bile duct: Diameter: 5 mm which is within normal limits. Liver: 1.2 cm septated cyst is noted in left hepatic lobe. Within normal limits in parenchymal echogenicity. Portal vein is patent on color Doppler imaging with normal direction of blood flow towards the liver. Other: None. IMPRESSION: No acute abnormality seen in the right upper quadrant the abdomen. Electronically Signed   By: Lupita Raider M.D.   On: 12/06/2022 15:33   MR ABDOMEN MRCP W  WO CONTAST  Result Date: 12/03/2022 CLINICAL DATA:  Ongoing right upper quadrant abdominal pain, cholelithiasis. EXAM: MRI ABDOMEN WITHOUT AND WITH CONTRAST (INCLUDING MRCP) TECHNIQUE: Multiplanar multisequence MR imaging of the abdomen was performed both before and after the administration of intravenous contrast. Heavily T2-weighted images of the biliary and pancreatic ducts were obtained, and three-dimensional MRCP images were rendered by post processing. Post-processing was applied at the acquisition scanner with concurrent physician supervision which includes 3D reconstructions, MIPs, volume rendered images and/or shaded surface rendering. CONTRAST:  9mL GADAVIST GADOBUTROL 1 MMOL/ML IV SOLN COMPARISON:  CT scan 12/02/2022 FINDINGS: Lower chest: Dependent subsegmental atelectasis in the lower lobes. 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 The gallbladder demonstrates wall thickening but also contraction on today's examination, complicating assessment for cholecystitis. Accentuated signal intensity in the gallbladder lumen compatible with sludge. Mildly accentuated enhancement of the gallbladder mucosa diffusely. No gallstones observed. No biliary dilatation. On image 32 of series 18, question of a distal CBD stone is raised, but this is not confirmed on any other imaging sequences and given the degree of artifact caused by motion, this is more likely to be artifactual. There are few scattered nonenhancing cystic lesions in the liver, favoring cysts over biliary hamartomas. No worrisome enhancing hepatic parenchymal lesion. Pancreas:  Unremarkable Spleen:  Unremarkable Adrenals/Urinary Tract:  Adrenal glands appear normal. Simple cysts are present in both kidneys. No further imaging workup of these lesions is indicated. Stomach/Bowel: Unremarkable Vascular/Lymphatic: Infrarenal abdominal aortic aneurysm 4.0 cm in diameter. Other:  No supplemental non-categorized  findings. Musculoskeletal: Dextroconvex lumbar scoliosis with lower thoracic and lumbar spondylosis and degenerative disc disease. IMPRESSION: 1. 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 The gallbladder is contracted, leading to wall thickening making it difficult to assess for inflammation. There is a suggestion of some gallbladder wall thickening/edema but much of this could be secondary to contraction. There is sludge in the gallbladder without visualized gallstones. 2. No biliary dilatation. Question of a distal CBD stone on a single image, but this is not confirmed on any other imaging sequences and given the degree of artifact caused by motion, this is more likely to be artifactual. 3. Infrarenal abdominal aortic aneurysm 4.0 cm in diameter. Recommend follow-up every 12 months and vascular consultation. This recommendation follows ACR consensus guidelines: White Paper of the ACR Incidental Findings Committee II on Vascular Findings. J Am Coll Radiol 2013; 73:220-254. 4. Dextroconvex lumbar scoliosis with lower thoracic and lumbar spondylosis and degenerative disc disease. 5. Dependent subsegmental atelectasis  in the lower lobes. Electronically Signed   By: Gaylyn Rong M.D.   On: 12/03/2022 06:56   MR 3D Recon At Scanner  Result Date: 12/03/2022 CLINICAL DATA:  Ongoing right upper quadrant abdominal pain, cholelithiasis. EXAM: MRI ABDOMEN WITHOUT AND WITH CONTRAST (INCLUDING MRCP) TECHNIQUE: Multiplanar multisequence MR imaging of the abdomen was performed both before and after the administration of intravenous contrast. Heavily T2-weighted images of the biliary and pancreatic ducts were obtained, and three-dimensional MRCP images were rendered by post processing. Post-processing was applied at the acquisition scanner with concurrent physician supervision which includes 3D reconstructions, MIPs, volume rendered images and/or shaded surface rendering. CONTRAST:  9mL  GADAVIST GADOBUTROL 1 MMOL/ML IV SOLN COMPARISON:  CT scan 12/02/2022 FINDINGS: Lower chest: Dependent subsegmental atelectasis in the lower lobes. 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 The gallbladder demonstrates wall thickening but also contraction on today's examination, complicating assessment for cholecystitis. Accentuated signal intensity in the gallbladder lumen compatible with sludge. Mildly accentuated enhancement of the gallbladder mucosa diffusely. No gallstones observed. No biliary dilatation. On image 32 of series 18, question of a distal CBD stone is raised, but this is not confirmed on any other imaging sequences and given the degree of artifact caused by motion, this is more likely to be artifactual. There are few scattered nonenhancing cystic lesions in the liver, favoring cysts over biliary hamartomas. No worrisome enhancing hepatic parenchymal lesion. Pancreas:  Unremarkable Spleen:  Unremarkable Adrenals/Urinary Tract:  Adrenal glands appear normal. Simple cysts are present in both kidneys. No further imaging workup of these lesions is indicated. Stomach/Bowel: Unremarkable Vascular/Lymphatic: Infrarenal abdominal aortic aneurysm 4.0 cm in diameter. Other:  No supplemental non-categorized findings. Musculoskeletal: Dextroconvex lumbar scoliosis with lower thoracic and lumbar spondylosis and degenerative disc disease. IMPRESSION: 1. 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 The gallbladder is contracted, leading to wall thickening making it difficult to assess for inflammation. There is a suggestion of some gallbladder wall thickening/edema but much of this could be secondary to contraction. There is sludge in the gallbladder without visualized gallstones. 2. No biliary dilatation. Question of a distal CBD stone on a single image, but this is not confirmed on any other imaging sequences and given the degree of  artifact caused by motion, this is more likely to be artifactual. 3. Infrarenal abdominal aortic aneurysm 4.0 cm in diameter. Recommend follow-up every 12 months and vascular consultation. This recommendation follows ACR consensus guidelines: White Paper of the ACR Incidental Findings Committee II on Vascular Findings. J Am Coll Radiol 2013; 62:952-841. 4. Dextroconvex lumbar scoliosis with lower thoracic and lumbar spondylosis and degenerative disc disease. 5. Dependent subsegmental atelectasis in the lower lobes. Electronically Signed   By: Gaylyn Rong M.D.   On: 12/03/2022 06:56   CT ABDOMEN PELVIS W CONTRAST  Result Date: 12/02/2022 CLINICAL DATA:  Abdomen pain epigastric pain lightheaded EXAM: CT ABDOMEN AND PELVIS WITH CONTRAST TECHNIQUE: Multidetector CT imaging of the abdomen and pelvis was performed using the standard protocol following bolus administration of intravenous contrast. RADIATION DOSE REDUCTION: This exam was performed according to the departmental dose-optimization program which includes automated exposure control, adjustment of the mA and/or kV according to patient size and/or use of iterative reconstruction technique. CONTRAST:  OMNIPAQUE IOHEXOL 300 MG/ML  SOLN COMPARISON:  Ultrasound 12/02/2022 FINDINGS: Lower chest: Lung bases demonstrate dependent atelectasis. Hepatobiliary: Subcentimeter hypodensities in the liver too small to further characterize. No biliary dilatation. Probable gallbladder folds at  the fundus. Haziness around the gallbladder, for example series 5, image 33. Pancreas: Unremarkable. No pancreatic ductal dilatation or surrounding inflammatory changes. Spleen: Normal in size without focal abnormality. Adrenals/Urinary Tract: Adrenal glands are normal. No hydronephrosis. Renal cysts, no imaging follow-up is recommended. The bladder is normal Stomach/Bowel: The stomach is nonenlarged. No dilated small bowel. No acute bowel wall thickening. Negative appendix.  Diverticular disease of the left colon. Vascular/Lymphatic: No suspicious lymph nodes. Mild aortic atherosclerosis. 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 Peripheral thrombus within the diffuse aneurysm. Reproductive: Slightly enlarged heterogeneous prostate Other: Negative for pelvic effusion or free air. Small fat containing umbilical hernia Musculoskeletal: Degenerative changes of the spine. No acute osseous abnormality. IMPRESSION: 1. Haziness around the gallbladder, without calcified stone or biliary dilatation. Acute cholecystitis not confirmed by previously performed ultrasound. Correlation with nuclear medicine hepatobiliary imaging could be obtained if indicated. 2. Diverticular disease of the left colon without acute bowel wall thickening. 3. Diffuse aneurysmal dilatation of the infrarenal abdominal aorta up to 4.1 cm. Recommend follow-up every 12 months and vascular consultation. This recommendation follows ACR consensus guidelines: White Paper of the ACR Incidental Findings Committee II on Vascular Findings. J Am Coll Radiol 2013; 16:109-604. 4. Aortic atherosclerosis. Aortic Atherosclerosis (ICD10-I70.0). Electronically Signed   By: Jasmine Pang M.D.   On: 12/02/2022 17:43   US Abdomen Limited RUQ (LIVER/GB)  Result Date: 12/02/2022 CLINICAL DATA:  RUQ pain EXAM: ULTRASOUND ABDOMEN LIMITED RIGHT UPPER QUADRANT COMPARISON:  None Available. FINDINGS: Gallbladder: Distended gallbladder. No shadowing stones. No wall thickening or adjacent fluid. Common bile duct: Diameter: 2 mm Liver: Small cystic area identified in the left hepatic lobe measuring 11 x 8 mm with a thin septa. Within normal limits in parenchymal echogenicity. Portal vein is patent on color Doppler imaging with normal direction of blood flow towards the liver. Other: None. IMPRESSION: Distended gallbladder.  No  stones.  No ductal dilatation. Minimally complex left hepatic lobe cyst measuring 11 mm Electronically Signed   By: Karen Kays M.D.   On: 12/02/2022 16:00   DG Chest 2 View  Result Date: 12/02/2022 CLINICAL DATA:  Chest pain EXAM: CHEST - 2 VIEW COMPARISON:  X-ray 06/07/2022 FINDINGS: The heart size and mediastinal contours are within normal limits. No consolidation, pneumothorax or effusion. No edema. Chronic lung changes identified. The visualized skeletal structures are unremarkable. Degenerative changes seen along the spine. IMPRESSION: Hyperinflation with chronic lung changes. No acute cardiopulmonary disease. Electronically Signed   By: Karen Kays M.D.   On: 12/02/2022 13:16    EKG:12/02/22 at 1125: SB w PACs rate 57 (personally reviewed)  TELEMETRY: SB with PACs and rare PVCs, rates 50s (personally reviewed)   Assessment/Plan: #) PACs #) Bradycardia #) SVT Has trialed multiple BB in the outpatient setting 12.5mg  toprol daily has worked best with fewest side effects while reducing SVT burden Continue 12.5mg  toprol   #) HFrEF NYHA II-III symptoms Warm and dry on exam Continue toprol as above Continue entresto 24/26 Consider adding spiro, jardiance at follow-up  #) drug-induced PMVT Avoid QT prolonging medications   EP will sign off at this time, but remains available Ok to discharge from EP perspective    For questions or updates, please contact [facility] Please consult [email or web address] for contact info under Cardiology/STEMI.  Signed, Sherie Don, NP  12/07/2022 8:08 AM  As above.  Multiple beta-blockers have been attempted, with cardiomyopathy  with my team metoprolol, do not think he is at appropriately considered for ISA beta-blocker.

## 2022-12-07 NOTE — Progress Notes (Signed)
[facility] SURGICAL PROGRESS NOTE (cpt [phone])  Hospital Day(s): 5.   Interval History: Patient seen and examined, no acute events or new complaints overnight. Patient reports he is now pain free and has not had pain medications in about 24 hours. He denied fever, chills, nausea, emesis. He remains without leukocytosis; 6.5K. Hgb to 15.1. Renal function at baseline; sCr - 1.39; UO - unmeasured x2. No electrolyte derangements. LFTs remain normal. HIDA and [place] yesterday (08/19) were without evidence of cholecystitis. He is Zosyn. Advanced to heart healthy diet; tolerating well.   Review of Systems:  Constitutional: denies fever, chills  HEENT: denies cough or congestion  Respiratory: denies any shortness of breath  Cardiovascular: denies chest pain or palpitations  Gastrointestinal: denied abdominal pain, denied N/V Musculoskeletal: denies pain, decreased motor or sensation  Vital signs in last 24 hours: [min-max] current  Temp:  [97.5 F (36.4 C)-98 F (36.7 C)] 98 F (36.7 C) (08/20 0450) Pulse Rate:  [46-56] 51 (08/20 0450) Resp:  [18-19] 18 (08/20 0450) BP: (125-147)/(69-96) 129/69 (08/20 0450) SpO2:  [95 %-98 %] 95 % (08/20 0450)     Height: 6' (182.9 cm) Weight: 90 kg BMI (Calculated): 26.9   Intake/Output last 2 shifts:  08/19 0701 - 08/20 0700 In: 467.2 [P.O.:240; I.V.:77.2; IV Piggyback:150.1] Out: -    Physical Exam:  Constitutional: alert, cooperative and no distress  HENT: normocephalic without obvious abnormality  Eyes: PERRL, EOM's grossly intact and symmetric  Respiratory: breathing non-labored at rest  Cardiovascular: regular rate and sinus rhythm  Gastrointestinal: soft, he is completely non-tender this morning, and non-distended, no rebound/guarding  Musculoskeletal: no edema or wounds, motor and sensation grossly intact, NT   Labs:     Latest Ref Rng & Units 12/07/2022    6:18 AM 12/06/2022    5:08 AM 12/05/2022    4:17 AM  CBC  WBC 4.0 -  10.5 K/uL 6.5  4.9  4.1   Hemoglobin 13.0 - 17.0 g/dL 91.4  78.2  95.6   Hematocrit 39.0 - 52.0 % 43.8  41.7  38.3   Platelets 150 - 400 K/uL 197  173  161       Latest Ref Rng & Units 12/07/2022    6:18 AM 12/06/2022    5:08 AM 12/05/2022    4:17 AM  CMP  Glucose 70 - 99 mg/dL 213  86  90   BUN 8 - 23 mg/dL 13  9  9    Creatinine 0.61 - 1.24 mg/dL 0.86  5.78  4.69   Sodium 135 - 145 mmol/L 137  138  138   Potassium 3.5 - 5.1 mmol/L 4.0  3.8  3.6   Chloride 98 - 111 mmol/L 108  108  107   CO2 22 - 32 mmol/L 24  24  23    Calcium 8.9 - 10.3 mg/dL 9.1  9.0  8.8   Total Protein 6.5 - 8.1 g/dL 7.1  6.7  6.4   Total Bilirubin 0.3 - 1.2 mg/dL 0.7  1.1  1.0   Alkaline Phos 38 - 126 U/L 101  103  112   AST 15 - 41 U/L 39  39  51   ALT 0 - 44 U/L 73  80  105      Imaging studies:   RUQ [place] (12/06/2022) personally reviewed without gallbladder distension, thickening, nor pericholecystic fluid, no evidence of cholecystitis, and radiologist report reviewed below:  IMPRESSION: No acute abnormality seen in the right upper quadrant the  abdomen.    HIDA (12/06/2022) shows filling of gallbladder appropriately without evidence of cholecystitis, and radiologist report reviewed below:  IMPRESSION: 1. Patent cystic duct and common bile duct without findings of cholecystitis. 2. Normal gallbladder ejection fraction without findings of gallbladder dysfunction on today's exam.   Assessment/Plan: (ICD-10's: K81.0) 67 y.o. male admitted with possible cholecystitis although extensive work up has failed to prove this although now without pain, complicated by history of STEMI and need for Plavix/ASA               - Okay to continue low fat diet; will leave dietary recommendations for home             - Agree with IV Abx (Zosyn); Can switch to Augmentin for home to complete 10 days total             - Monitor abdominal examination; on-going bowel function             - Pain control prn; antiemetics prn              - Okay to mobilize  - Labs normal; no leukocytosis; LFTs are normal             - Further management per primary service; we will follow    - Discharge Planning: Okay for discharge from surgical perspective. Antibiotic recommendations as above. We will update instructions and dietary recommendations. We will follow up in 2-3 weeks.    All of the above findings and recommendations were discussed with the patient, and the medical team, and all of patient's questions were answered to his expressed satisfaction.  -- Lynden Oxford, PA-C [facility] 12/07/2022, 7:22 AM M-F: 7am - 4pm

## 2022-12-07 NOTE — Discharge Summary (Signed)
[facility] Discharge Summary   Patient: Darryl Gilbert ZOX:[id number]  PCP: Dortha Kern, MD  Date of admission: 12/02/2022   Date of discharge:  12/07/2022     Discharge Diagnoses:  Principal Problem:   Abdominal pain Active Problems:   Paroxysmal atrial fibrillation (HCC)   Coronary artery disease involving native coronary artery of native heart with angina pectoris (HCC)   Cholecystitis, acute   Admitted From: Home Disposition:  Home   Recommendations for Outpatient Follow-up:  Follow-up with PCP in 1 week Follow-up with general surgery in 1 to 2 weeks Follow-up with cardiology as per schedule. Follow up LABS/TEST:     Follow-up Information     Piscoya, Elita Quick, MD Follow up in 3 week(s).   Specialty: General Surgery Contact information: [address] [place] [address] [phone]         Dortha Kern, MD Follow up in 1 week(s).   Specialty: Family Medicine Contact information: [address] [place] [address] [phone]                Diet recommendation: Carb modified diet  Activity: The patient is advised to gradually reintroduce usual activities, as tolerated  Discharge Condition: stable  Code Status: Full code   History of present illness: As per the H and P dictated on admission. Hospital Course:  Darryl Gilbert is a 67 y.o. male with PMH of CAD, HTN, CHF, paroxysmal A-fib, cigarette smoking, as reviewed from EMR, presented at [facility] ED with complaining of epigastric abdominal pain radiating to both sides of diaphragm, started in the morning at rest.  Patient had nausea, diaphoresis and shortness of breath.  He called PCP and was recommended to go to ED. ED workup: Bradycardia heart rate 52, tachypneic respiratory 22, BP stable, saturating well on room air.  Creatinine 1.35, slightly elevated, transaminitis, elevated bilirubin as well. Troponin negative, WBC within normal range.  No anemia.   CT A/P: 1. Haziness  around the gallbladder, without calcified stone or biliary dilatation. Acute cholecystitis not confirmed by previously performed ultrasound. Correlation with nuclear medicine hepatobiliary imaging could be obtained if indicated. 2. Diverticular disease of the left colon without acute bowel wall thickening. 3. Diffuse aneurysmal dilatation of the infrarenal abdominal aorta up to 4.1 cm. Recommend follow-up every 12 months and vascular consultation.    MRCP: Reviewed as below   General surgery was consulted, TRH consulted for admission and further management as below.   Assessment and Plan:   # Acute cholecystitis  Presented with abdominal pain. S/p IV Zosyn, PRN pain meds. General Surgery consulted, reviewed MRCP, patient is not a candidate for ERCP.  Continue conservative management for now, gallbladder is contracted so cannot do cholecystostomy by IR at this time.  Patient will need definitive treatment as an outpatient in 2 months. Patient will need cardiac clearance to stop antiplatelet therapy before surgical intervention.  Repeat ultrasound and HIDA scan negative for cholecystitis, normal GB EF.  Diet was gradually advanced, patient tolerated well and currently patient is pain-free.  LFTs improved.  Patient denies any symptoms.  Patient was cleared by general surgery to discharge home and follow-up as an outpatient.  Patient was discharged on Augmentin twice daily for 3 days to complete 7-day course of antibiotics.  Follow-up with general surgery in 1 to 2 weeks. # CAD, CHF and hypertension: Continued aspirin and Plavix. Held Lipitor secondary to transaminitis, resumed on discharge. On 8/18 resumed Toprol-XL 12.5 mg p.o. daily and Entresto  1 tablet p.o. twice daily as per cardiology.  # Paroxysmal atrial fibrillation, not on [facility] Patient has chronic history of resting bradycardia, heart rate improved with activity. continued aspirin 81, and Plavix 75 mg Lopressor held due to bradycardia,  cardiology was consulted recommended to resumed Toprol-XL 12.5 mg p.o. daily on 8/18 due to PVCs and PACs.  # COPD, continue Anora Ellipta inhaler # Depression, continue Wellbutrin # Abdominal aortic aneurysm, infrarenal, 4.1 cm. Patient was advised to follow-up with vascular surgery as an outpatient.   Body mass index is 26.91 kg/m.  Nutrition Interventions:  - Patient was instructed, not to drive, operate heavy machinery, perform activities at heights, swimming or participation in water activities or provide baby sitting services while on Pain, Sleep and Anxiety Medications; until his outpatient Physician has advised to do so again.  - Also recommended to not to take more than prescribed Pain, Sleep and Anxiety Medications.  Patient was ambulatory without any assistance. On the day of the discharge the patient's vitals were stable, and no other acute medical condition were reported by patient. the patient was felt safe to be discharge at Home.  Consultants: General Surgery, cardiology Procedures: None  Discharge Exam: General: Appear in no distress, no Rash; Oral Mucosa Clear, moist. Cardiovascular: S1 and S2 Present, no Murmur, Respiratory: normal respiratory effort, Bilateral Air entry present and no Crackles, no wheezes Abdomen: Bowel Sound present, Soft and no tenderness, no hernia Extremities: no Pedal edema, no calf tenderness Neurology: alert and oriented to time, place, and person affect appropriate.  Filed Weights   12/04/22 0451 12/05/22 0430 12/06/22 0348  Weight: 90.8 kg 90 kg 90 kg   Vitals:   12/07/22 0450 12/07/22 0804  BP: 129/69 137/81  Pulse: (!) 51 (!) 49  Resp: 18 18  Temp: 98 F (36.7 C) 98.2 F (36.8 C)  SpO2: 95% 95%    DISCHARGE MEDICATION: Allergies as of 12/07/2022       Reactions   Amiodarone Other (See Comments)   QT prolongation in the context of inferior STEMI and IV amiodarone use. Avoid all QT prolonging medications.          Medication List     TAKE these medications    amoxicillin-clavulanate 875-125 MG tablet Commonly known as: AUGMENTIN Take 1 tablet by mouth 2 (two) times daily for 3 days.   Anoro Ellipta 62.5-25 MCG/ACT Aepb Generic drug: umeclidinium-vilanterol Inhale 1 puff into the lungs daily.   aspirin 81 MG chewable tablet Chew 1 tablet (81 mg total) by mouth daily.   atorvastatin 80 MG tablet Commonly known as: LIPITOR Take 1 tablet (80 mg total) by mouth daily.   buPROPion 300 MG 24 hr tablet Commonly known as: WELLBUTRIN XL Take 300 mg by mouth daily.   clopidogrel 75 MG tablet Commonly known as: PLAVIX Take 1 tablet (75 mg total) by mouth daily.   Entresto 24-26 MG Generic drug: sacubitril-valsartan Take 1 tablet by mouth 2 (two) times daily.   esomeprazole 20 MG capsule Commonly known as: NEXIUM Take 1 capsule (20 mg total) by mouth daily at 12 noon.   furosemide 20 MG tablet Commonly known as: LASIX Take 1 tablet (20 mg total) by mouth daily.   HYDROcodone-acetaminophen 10-325 MG tablet Commonly known as: NORCO Take 1 tablet by mouth 3 (three) times daily as needed.   metoprolol succinate 25 MG 24 hr tablet Commonly known as: Toprol XL Take 0.5 tablets (12.5 mg total) by mouth at bedtime.   nitroGLYCERIN 0.4  MG SL tablet Commonly known as: NITROSTAT Place 1 tablet (0.4 mg total) under the tongue every 5 (five) minutes as needed for chest pain.   potassium chloride 10 MEQ tablet Commonly known as: KLOR-CON Take 1 tablet (10 mEq total) by mouth daily.   tamsulosin 0.4 MG Caps capsule Commonly known as: FLOMAX Take 0.4 mg by mouth daily.       Allergies  Allergen Reactions   Amiodarone Other (See Comments)    QT prolongation in the context of inferior STEMI and IV amiodarone use. Avoid all QT prolonging medications.    Discharge Instructions     Call MD for:  difficulty breathing, headache or visual disturbances   Complete by: As directed    Call MD  for:  extreme fatigue   Complete by: As directed    Call MD for:  persistant dizziness or light-headedness   Complete by: As directed    Call MD for:  persistant nausea and vomiting   Complete by: As directed    Call MD for:  severe uncontrolled pain   Complete by: As directed    Call MD for:  temperature >100.4   Complete by: As directed    Diet - low sodium heart healthy   Complete by: As directed    Discharge instructions   Complete by: As directed    Follow-up with PCP in 1 week Follow-up with general surgery in 1 to 2 weeks Follow-up with cardiology as per schedule.   Increase activity slowly   Complete by: As directed        The results of significant diagnostics from this hospitalization (including imaging, microbiology, ancillary and laboratory) are listed below for reference.    Significant Diagnostic Studies: NM Hepato W/EF  Result Date: 12/06/2022 CLINICAL DATA:  Cholecystitis EXAM: NUCLEAR MEDICINE HEPATOBILIARY IMAGING WITH GALLBLADDER EF TECHNIQUE: Sequential images of the abdomen were obtained out to 60 minutes following intravenous administration of radiopharmaceutical. After oral ingestion of Ensure, gallbladder ejection fraction was determined. At 60 min, normal ejection fraction is greater than 33%. RADIOPHARMACEUTICALS:  4.9 mCi Tc-81m  Choletec IV COMPARISON:  12/02/2022 MRI FINDINGS: Satisfactory uptake of radiopharmaceutical from the blood pool. Biliary activity at 14 minutes. Gallbladder activity at 21 minutes. Bowel activity at 35 minutes. The patient experienced no new symptoms upon drinking the Ensure meal. Calculated gallbladder ejection fraction is 76%. (Normal gallbladder ejection fraction with Ensure is greater than 33%.) IMPRESSION: 1. Patent cystic duct and common bile duct without findings of cholecystitis. 2. Normal gallbladder ejection fraction without findings of gallbladder dysfunction on today's exam. Electronically Signed   By: Gaylyn Rong  M.D.   On: 12/06/2022 19:31   US Abdomen Limited RUQ (LIVER/GB)  Result Date: 12/06/2022 CLINICAL DATA:  Possible cholecystitis based on prior imaging. EXAM: ULTRASOUND ABDOMEN LIMITED RIGHT UPPER QUADRANT COMPARISON:  MRI and CT scans of December 02, 2022. FINDINGS: Gallbladder: No gallstones or wall thickening visualized. No sonographic Murphy sign noted by sonographer. Common bile duct: Diameter: 5 mm which is within normal limits. Liver: 1.2 cm septated cyst is noted in left hepatic lobe. Within normal limits in parenchymal echogenicity. Portal vein is patent on color Doppler imaging with normal direction of blood flow towards the liver. Other: None. IMPRESSION: No acute abnormality seen in the right upper quadrant the abdomen. Electronically Signed   By: Lupita Raider M.D.   On: 12/06/2022 15:33   MR ABDOMEN MRCP W WO CONTAST  Result Date: 12/03/2022 CLINICAL DATA:  Ongoing right  upper quadrant abdominal pain, cholelithiasis. EXAM: MRI ABDOMEN WITHOUT AND WITH CONTRAST (INCLUDING MRCP) TECHNIQUE: Multiplanar multisequence MR imaging of the abdomen was performed both before and after the administration of intravenous contrast. Heavily T2-weighted images of the biliary and pancreatic ducts were obtained, and three-dimensional MRCP images were rendered by post processing. Post-processing was applied at the acquisition scanner with concurrent physician supervision which includes 3D reconstructions, MIPs, volume rendered images and/or shaded surface rendering. CONTRAST:  9mL GADAVIST GADOBUTROL 1 MMOL/ML IV SOLN COMPARISON:  CT scan 12/02/2022 FINDINGS: Lower chest: Dependent subsegmental atelectasis in the lower lobes. 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 The gallbladder demonstrates wall thickening but also contraction on today's examination, complicating assessment for cholecystitis. Accentuated signal intensity in the gallbladder lumen compatible with  sludge. Mildly accentuated enhancement of the gallbladder mucosa diffusely. No gallstones observed. No biliary dilatation. On image 32 of series 18, question of a distal CBD stone is raised, but this is not confirmed on any other imaging sequences and given the degree of artifact caused by motion, this is more likely to be artifactual. There are few scattered nonenhancing cystic lesions in the liver, favoring cysts over biliary hamartomas. No worrisome enhancing hepatic parenchymal lesion. Pancreas:  Unremarkable Spleen:  Unremarkable Adrenals/Urinary Tract:  Adrenal glands appear normal. Simple cysts are present in both kidneys. No further imaging workup of these lesions is indicated. Stomach/Bowel: Unremarkable Vascular/Lymphatic: Infrarenal abdominal aortic aneurysm 4.0 cm in diameter. Other:  No supplemental non-categorized findings. Musculoskeletal: Dextroconvex lumbar scoliosis with lower thoracic and lumbar spondylosis and degenerative disc disease. IMPRESSION: 1. 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 The gallbladder is contracted, leading to wall thickening making it difficult to assess for inflammation. There is a suggestion of some gallbladder wall thickening/edema but much of this could be secondary to contraction. There is sludge in the gallbladder without visualized gallstones. 2. No biliary dilatation. Question of a distal CBD stone on a single image, but this is not confirmed on any other imaging sequences and given the degree of artifact caused by motion, this is more likely to be artifactual. 3. Infrarenal abdominal aortic aneurysm 4.0 cm in diameter. Recommend follow-up every 12 months and vascular consultation. This recommendation follows ACR consensus guidelines: White Paper of the ACR Incidental Findings Committee II on Vascular Findings. J Am Coll Radiol 2013; 40:981-191. 4. Dextroconvex lumbar scoliosis with lower thoracic and lumbar spondylosis and  degenerative disc disease. 5. Dependent subsegmental atelectasis in the lower lobes. Electronically Signed   By: Gaylyn Rong M.D.   On: 12/03/2022 06:56   MR 3D Recon At Scanner  Result Date: 12/03/2022 CLINICAL DATA:  Ongoing right upper quadrant abdominal pain, cholelithiasis. EXAM: MRI ABDOMEN WITHOUT AND WITH CONTRAST (INCLUDING MRCP) TECHNIQUE: Multiplanar multisequence MR imaging of the abdomen was performed both before and after the administration of intravenous contrast. Heavily T2-weighted images of the biliary and pancreatic ducts were obtained, and three-dimensional MRCP images were rendered by post processing. Post-processing was applied at the acquisition scanner with concurrent physician supervision which includes 3D reconstructions, MIPs, volume rendered images and/or shaded surface rendering. CONTRAST:  9mL GADAVIST GADOBUTROL 1 MMOL/ML IV SOLN COMPARISON:  CT scan 12/02/2022 FINDINGS: Lower chest: Dependent subsegmental atelectasis in the lower lobes. 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 The gallbladder demonstrates wall thickening but also contraction on today's examination, complicating assessment for cholecystitis. Accentuated signal intensity in the gallbladder lumen compatible with sludge. Mildly  accentuated enhancement of the gallbladder mucosa diffusely. No gallstones observed. No biliary dilatation. On image 32 of series 18, question of a distal CBD stone is raised, but this is not confirmed on any other imaging sequences and given the degree of artifact caused by motion, this is more likely to be artifactual. There are few scattered nonenhancing cystic lesions in the liver, favoring cysts over biliary hamartomas. No worrisome enhancing hepatic parenchymal lesion. Pancreas:  Unremarkable Spleen:  Unremarkable Adrenals/Urinary Tract:  Adrenal glands appear normal. Simple cysts are present in both kidneys. No further imaging workup  of these lesions is indicated. Stomach/Bowel: Unremarkable Vascular/Lymphatic: Infrarenal abdominal aortic aneurysm 4.0 cm in diameter. Other:  No supplemental non-categorized findings. Musculoskeletal: Dextroconvex lumbar scoliosis with lower thoracic and lumbar spondylosis and degenerative disc disease. IMPRESSION: 1. 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 The gallbladder is contracted, leading to wall thickening making it difficult to assess for inflammation. There is a suggestion of some gallbladder wall thickening/edema but much of this could be secondary to contraction. There is sludge in the gallbladder without visualized gallstones. 2. No biliary dilatation. Question of a distal CBD stone on a single image, but this is not confirmed on any other imaging sequences and given the degree of artifact caused by motion, this is more likely to be artifactual. 3. Infrarenal abdominal aortic aneurysm 4.0 cm in diameter. Recommend follow-up every 12 months and vascular consultation. This recommendation follows ACR consensus guidelines: White Paper of the ACR Incidental Findings Committee II on Vascular Findings. J Am Coll Radiol 2013; 21:308-657. 4. Dextroconvex lumbar scoliosis with lower thoracic and lumbar spondylosis and degenerative disc disease. 5. Dependent subsegmental atelectasis in the lower lobes. Electronically Signed   By: Gaylyn Rong M.D.   On: 12/03/2022 06:56   CT ABDOMEN PELVIS W CONTRAST  Result Date: 12/02/2022 CLINICAL DATA:  Abdomen pain epigastric pain lightheaded EXAM: CT ABDOMEN AND PELVIS WITH CONTRAST TECHNIQUE: Multidetector CT imaging of the abdomen and pelvis was performed using the standard protocol following bolus administration of intravenous contrast. RADIATION DOSE REDUCTION: This exam was performed according to the departmental dose-optimization program which includes automated exposure control, adjustment of the mA and/or kV according  to patient size and/or use of iterative reconstruction technique. CONTRAST:  OMNIPAQUE IOHEXOL 300 MG/ML  SOLN COMPARISON:  Ultrasound 12/02/2022 FINDINGS: Lower chest: Lung bases demonstrate dependent atelectasis. Hepatobiliary: Subcentimeter hypodensities in the liver too small to further characterize. No biliary dilatation. Probable gallbladder folds at the fundus. Haziness around the gallbladder, for example series 5, image 33. Pancreas: Unremarkable. No pancreatic ductal dilatation or surrounding inflammatory changes. Spleen: Normal in size without focal abnormality. Adrenals/Urinary Tract: Adrenal glands are normal. No hydronephrosis. Renal cysts, no imaging follow-up is recommended. The bladder is normal Stomach/Bowel: The stomach is nonenlarged. No dilated small bowel. No acute bowel wall thickening. Negative appendix. Diverticular disease of the left colon. Vascular/Lymphatic: No suspicious lymph nodes. Mild aortic atherosclerosis. 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 Peripheral thrombus within the diffuse aneurysm. Reproductive: Slightly enlarged heterogeneous prostate Other: Negative for pelvic effusion or free air. Small fat containing umbilical hernia Musculoskeletal: Degenerative changes of the spine. No acute osseous abnormality. IMPRESSION: 1. Haziness around the gallbladder, without calcified stone or biliary dilatation. Acute cholecystitis not confirmed by previously performed ultrasound. Correlation with nuclear medicine hepatobiliary imaging could be obtained if indicated. 2. Diverticular disease of the  left colon without acute bowel wall thickening. 3. Diffuse aneurysmal dilatation of the infrarenal abdominal aorta up to 4.1 cm. Recommend follow-up every 12 months and vascular consultation. This recommendation follows ACR consensus guidelines: White Paper of the ACR  Incidental Findings Committee II on Vascular Findings. J Am Coll Radiol 2013; 45:409-811. 4. Aortic atherosclerosis. Aortic Atherosclerosis (ICD10-I70.0). Electronically Signed   By: Jasmine Pang M.D.   On: 12/02/2022 17:43   US Abdomen Limited RUQ (LIVER/GB)  Result Date: 12/02/2022 CLINICAL DATA:  RUQ pain EXAM: ULTRASOUND ABDOMEN LIMITED RIGHT UPPER QUADRANT COMPARISON:  None Available. FINDINGS: Gallbladder: Distended gallbladder. No shadowing stones. No wall thickening or adjacent fluid. Common bile duct: Diameter: 2 mm Liver: Small cystic area identified in the left hepatic lobe measuring 11 x 8 mm with a thin septa. Within normal limits in parenchymal echogenicity. Portal vein is patent on color Doppler imaging with normal direction of blood flow towards the liver. Other: None. IMPRESSION: Distended gallbladder.  No stones.  No ductal dilatation. Minimally complex left hepatic lobe cyst measuring 11 mm Electronically Signed   By: Karen Kays M.D.   On: 12/02/2022 16:00   DG Chest 2 View  Result Date: 12/02/2022 CLINICAL DATA:  Chest pain EXAM: CHEST - 2 VIEW COMPARISON:  X-ray 06/07/2022 FINDINGS: The heart size and mediastinal contours are within normal limits. No consolidation, pneumothorax or effusion. No edema. Chronic lung changes identified. The visualized skeletal structures are unremarkable. Degenerative changes seen along the spine. IMPRESSION: Hyperinflation with chronic lung changes. No acute cardiopulmonary disease. Electronically Signed   By: Karen Kays M.D.   On: 12/02/2022 13:16    Microbiology: No results found for this or any previous visit (from the past 240 hour(s)).   Labs: CBC: Recent Labs  Lab 12/03/22 0657 12/04/22 0435 12/05/22 0417 12/06/22 0508 12/07/22 0618  WBC 4.6 5.3 4.1 4.9 6.5  HGB 13.6 13.2 13.4 14.2 15.1  HCT 40.3 39.0 38.3* 41.7 43.8  MCV 92.4 89.9 90.1 91.0 90.3  PLT 163 164 161 173 197   Basic Metabolic Panel: Recent Labs  Lab  12/03/22 0657 12/04/22 0435 12/05/22 0417 12/06/22 0508 12/07/22 0618  NA 139 138 138 138 137  K 4.2 3.9 3.6 3.8 4.0  CL 109 108 107 108 108  CO2 23 23 23 24 24   GLUCOSE 97 100* 90 86 113*  BUN 16 12 9 9 13   CREATININE 1.35* 1.37* 1.48* 1.37* 1.39*  CALCIUM 8.7* 8.9 8.8* 9.0 9.1  MG  --  2.1 2.2 2.5*  --   PHOS  --  3.5 3.6 3.4  --    Liver Function Tests: Recent Labs  Lab 12/03/22 0657 12/04/22 0435 12/05/22 0417 12/06/22 0508 12/07/22 0618  AST 109* 49* 51* 39 39  ALT 164* 110* 105* 80* 73*  ALKPHOS 102 91 112 103 101  BILITOT 1.7* 1.2 1.0 1.1 0.7  PROT 6.4* 6.6 6.4* 6.7 7.1  ALBUMIN 3.4* 3.6 3.5 3.6 3.6   Recent Labs  Lab 12/02/22 1333 12/04/22 0435 12/05/22 0417  LIPASE 44 35 31   No results for input(s): "AMMONIA" in the last 168 hours. Cardiac Enzymes: No results for input(s): "CKTOTAL", "CKMB", "CKMBINDEX", "TROPONINI" in the last 168 hours. BNP (last 3 results) Recent Labs    03/04/22 1017  BNP 85.5   CBG: No results for input(s): "GLUCAP" in the last 168 hours.  Time spent: 35 minutes  Signed:  Gillis Santa  [facility] 12/07/2022 9:25 AM

## 2022-12-07 NOTE — Plan of Care (Signed)

## 2022-12-17 ENCOUNTER — Ambulatory Visit: Payer: 59 | Attending: Cardiovascular Disease | Admitting: Cardiovascular Disease

## 2022-12-17 ENCOUNTER — Encounter: Payer: Self-pay | Admitting: Cardiovascular Disease

## 2022-12-17 VITALS — BP 118/84 | HR 51 | Ht 72.0 in | Wt 198.4 lb

## 2022-12-17 DIAGNOSIS — I2510 Atherosclerotic heart disease of native coronary artery without angina pectoris: Secondary | ICD-10-CM | POA: Diagnosis not present

## 2022-12-17 DIAGNOSIS — I471 Supraventricular tachycardia, unspecified: Secondary | ICD-10-CM

## 2022-12-17 DIAGNOSIS — E785 Hyperlipidemia, unspecified: Secondary | ICD-10-CM | POA: Diagnosis not present

## 2022-12-17 DIAGNOSIS — I7143 Infrarenal abdominal aortic aneurysm, without rupture: Secondary | ICD-10-CM

## 2022-12-17 DIAGNOSIS — I5022 Chronic systolic (congestive) heart failure: Secondary | ICD-10-CM | POA: Diagnosis not present

## 2022-12-17 MED ORDER — SPIRONOLACTONE 25 MG PO TABS: 12.5000 mg | ORAL_TABLET | Freq: Every day | ORAL | 1 refills | Status: DC

## 2022-12-17 NOTE — Progress Notes (Signed)
Cardiology Office Note   Date:  12/17/2022   ID:  [name], [name] 1955/10/10, MRN [id number]  PCP:  Dortha Kern, MD  Cardiologist:   Lorine Bears, MD   Chief Complaint  Patient presents with   Follow-up    Recent hospital admission with discharge on 12/07/22  Patient denies new or acute cardiac problems/concerns today.        History of Present Illness: EDOUARD Gilbert is a 67 y.o. male who presents for follow-up visit regarding coronary artery disease and chronic systolic heart failure. He has known history of essential hypertension, hyperlipidemia, chronic back pain and tobacco use. He presented in September 2022 with inferior ST elevation myocardial infarction.  Emergent cardiac catheterization showed codominant coronary arteries with thrombotic occlusion of the proximal left circumflex.  I performed successful angioplasty and drug-eluting stent placement to the left circumflex.  He had atrial fibrillation that was treated with amiodarone drip and then he converted to sinus rhythm.  Echocardiogram showed an EF of 50%.  While on amiodarone, he developed prolonged QT interval and frequent PVCs followed by polymorphic ventricular tachycardia that required multiple shocks.  Amiodarone was discontinued.  Repeat echocardiogram showed stable EF of 45 to 50%. He continued to have significant shortness of breath and thus he underwent repeat cardiac catheterization in November 2022  which showed patent left circumflex stent with no significant restenosis with an EF of 35 to 40%.  Metoprolol was discontinued during that time due to bradycardia with heart rate in the 40s. Echocardiogram in March 2023 showed an EF of 45%. He quit smoking after his myocardial infarction. He had a repeat echocardiogram in January of this year which showed an EF of 35 to 40% with mild to moderate mitral regurgitation.  He underwent a Lexiscan Myoview also in January which showed evidence of prior infarct in the left  circumflex distribution without ischemia.  EF was 48%.  He had COVID-19 infection in February and was extremely sick for about 3 weeks with fever, chills, shortness of breath and severe cough.   He had issues with frequent PACs and PVCs.  He was hospitalized this month with cholecystitis status post MRCP.  We were asked to evaluate him for asymptomatic bradycardia.  His small dose Toprol was held initially but he felt significantly worse without it with increased palpitations and shortness of breath.  Small dose Toprol was resumed before hospital discharge.  He has been doing well with no chest pain.  He has chronic exertional dyspnea but reports some improvement in symptoms.  He was seen by pulmonary for COPD.  He did have a CT scan of the abdomen that incidentally showed abdominal aortic aneurysm measuring 4.1 cm.   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    Hyperlipidemia LDL goal <70    Hypertension    Ischemic cardiomyopathy    Smoking     Past Surgical History:  Procedure Laterality Date   COLONOSCOPY WITH PROPOFOL N/A 05/07/2022   Procedure: COLONOSCOPY WITH PROPOFOL;  Surgeon: Wyline Mood, MD;  Location: [facility] ENDOSCOPY;  Service: Gastroenterology;  Laterality: N/A;   CORONARY/GRAFT ACUTE MI REVASCULARIZATION N/A 12/20/2020   Procedure: Coronary/Graft Acute MI Revascularization;  Surgeon: [name], MD;  Location: [facility] INVASIVE CV LAB;  Service:  Cardiovascular;  Laterality: N/A;   FRACTURE SURGERY     Left leg fracture, rod placement   LEFT HEART CATH AND CORONARY ANGIOGRAPHY N/A 12/20/2020   Procedure: LEFT HEART CATH AND CORONARY ANGIOGRAPHY;  Surgeon: [name], MD;  Location: [facility] INVASIVE CV LAB;  Service: Cardiovascular;  Laterality: N/A;   LEFT HEART CATH AND  CORONARY ANGIOGRAPHY N/A 02/23/2021   Procedure: LEFT HEART CATH AND CORONARY ANGIOGRAPHY;  Surgeon: [name], MD;  Location: [facility] INVASIVE CV LAB;  Service: Cardiovascular;  Laterality: N/A;     Current Outpatient Medications  Medication Sig Dispense Refill   aspirin 81 MG chewable tablet Chew 1 tablet (81 mg total) by mouth daily. 30 tablet 0   atorvastatin (LIPITOR) 80 MG tablet Take 1 tablet (80 mg total) by mouth daily. 90 tablet 3   buPROPion (WELLBUTRIN XL) 300 MG 24 hr tablet Take 300 mg by mouth daily.     clopidogrel (PLAVIX) 75 MG tablet Take 1 tablet (75 mg total) by mouth daily. 90 tablet 3   esomeprazole (NEXIUM) 20 MG capsule Take 1 capsule (20 mg total) by mouth daily at 12 noon. 30 capsule 6   furosemide (LASIX) 20 MG tablet Take 1 tablet (20 mg total) by mouth daily. 90 tablet 3   HYDROcodone-acetaminophen (NORCO) 10-325 MG tablet Take 1 tablet by mouth 3 (three) times daily as needed.     metoprolol succinate (TOPROL XL) 25 MG 24 hr tablet Take 0.5 tablets (12.5 mg total) by mouth at bedtime. (Patient taking differently: Take 12.5 mg by mouth in the morning.)     nitroGLYCERIN (NITROSTAT) 0.4 MG SL tablet Place 1 tablet (0.4 mg total) under the tongue every 5 (five) minutes as needed for chest pain. 25 tablet 3   potassium chloride (KLOR-CON) 10 MEQ tablet Take 1 tablet (10 mEq total) by mouth daily. 90 tablet 3   tamsulosin (FLOMAX) 0.4 MG CAPS capsule Take 0.4 mg by mouth daily.     umeclidinium-vilanterol (ANORO ELLIPTA) 62.5-25 MCG/ACT AEPB Inhale 1 puff into the lungs daily. 30 each 11   No current facility-administered medications for this visit.    Allergies:   Amiodarone    Social History:  The patient  reports that he quit smoking about 1 years ago. His smoking use included cigarettes. He started smoking about 47 years ago. He has a 67.5 pack-year smoking history. He has never used smokeless tobacco. He reports that he does not currently use alcohol. He  reports that he does not use drugs.   Family History:  The patient's family history is not on file.    ROS:  Please see the history of present illness.   Otherwise, review of systems are positive for none.   All other systems are reviewed and negative.    PHYSICAL EXAM: VS:  BP 118/84 (BP Location: Left Arm, Patient Position: Sitting, Cuff Size: Normal)   Pulse (!) 51   Ht 6' (1.829 m)   Wt 198 lb 6.4 oz (90 kg)   SpO2 98%   BMI 26.91 kg/m  , BMI Body mass index is 26.91 kg/m. GEN: Well nourished, well developed, in no acute distress  HEENT: normal  Neck: no JVD, carotid bruits, or masses Cardiac: RRR; no murmurs, rubs, or gallops,no edema  Respiratory:  clear to auscultation bilaterally, normal work of breathing GI: soft, nontender, nondistended, + BS MS: no deformity or atrophy  Skin: warm and dry, no rash Neuro:  Strength and sensation are intact Psych:  euthymic mood, full affect   EKG:  EKG is ordered today. The ekg ordered today demonstrates: Sinus bradycardia When compared with ECG of 02-Dec-2022 11:25, No significant change was found    Recent Labs: 03/04/2022: B Natriuretic Peptide 85.5 12/06/2022: Magnesium 2.5 12/07/2022: ALT 73; BUN 13; Creatinine, Ser 1.39; Hemoglobin 15.1; Platelets 197; Potassium 4.0; Sodium 137    Lipid Panel    Component Value Date/Time   CHOL 130 03/04/2022 1017   CHOL 166 02/04/2021 0909   TRIG 175 (H) 03/04/2022 1017   HDL 37 (L) 03/04/2022 1017   HDL 38 (L) 02/04/2021 0909   CHOLHDL 3.5 03/04/2022 1017   VLDL 35 03/04/2022 1017   LDLCALC 58 03/04/2022 1017   LDLCALC 102 (H) 02/04/2021 0909      Wt Readings from Last 3 Encounters:  12/17/22 198 lb 6.4 oz (90 kg)  12/06/22 198 lb 6.6 oz (90 kg)  11/17/22 200 lb (90.7 kg)          No data to display            ASSESSMENT AND PLAN:  1.  Coronary artery disease involving native coronary arteries without angina: No recurrent chest pain and overall he is doing  reasonably well from a cardiac standpoint.   He does have ectasia in the right coronary artery and thus I elected to keep him on long-term dual antiplatelet therapy.    2 . chronic systolic heart failure due to ischemic cardiomyopathy: No evidence of volume overload.  Continue small dose Toprol which cannot be increased due to underlying bradycardia.  Continue Entresto.  I added spironolactone 12.5 mg once daily and discontinued potassium chloride.  Check basic metabolic profile in 1 week.  Will consider adding an SGLT2 inhibitor in the near future.  3.  Hyperlipidemia: Continue high-dose atorvastatin.  Most recent lipid profile showed an LDL of 58.  4.  Abdominal aortic aneurysm: Measuring 4.1 cm on recent CT scan of the abdomen.  Will plan on ultrasound follow-up in our office in February to ensure stability in size.  If it remains stable, surveillance will be on an annual basis.  I instructed him not to carry more than 30 pounds.  5.  Frequent PACs and occasional PVCs: Symptoms are controlled with small dose Toprol.    Disposition:   FU with me in 4 months  Signed,  Lorine Bears, MD  12/17/2022 8:45 AM     [facility]

## 2022-12-17 NOTE — Patient Instructions (Signed)
Medication Instructions:  STOP the Potassium  START Spironolactone 12.5 mg once daily (half a tablet)  *If you need a refill on your cardiac medications before your next appointment, please call your pharmacy*   Lab Work: Your provider would like for you to return in one week to have the following labs drawn: BMEt.   Please go to [facility] [address] ([facility], [place] [address] You do not need an appointment.  They are open from 7:30 am-4 pm.  Lunch from 1:00 pm- 2:00 pm You will not need to be fasting.   You may also go to any of these [facility] locations:  [employer]  - [address] [employer] [address] Chief Technology Officer)     If you have labs (blood work) drawn today and your tests are completely normal, you will receive your results only by: [employer] (if you have MyChart) OR A paper copy in the mail If you have any lab test that is abnormal or we need to change your treatment, we will call you to review the results.   Testing/Procedures: None ordered   Follow-Up: At [facility], you and your health needs are our priority.  As part of our continuing mission to provide you with exceptional heart care, we have created designated Provider Care Teams.  These Care Teams include your primary Cardiologist (physician) and Advanced Practice Providers (APPs -  Physician Assistants and Nurse Practitioners) who all work together to provide you with the care you need, when you need it.  We recommend signing up for the patient portal called "MyChart".  Sign up information is provided on this After Visit Summary.  MyChart is used to connect with patients for Virtual Visits (Telemedicine).  Patients are able to view lab/test results, encounter notes, upcoming appointments, etc.  Non-urgent messages can be sent to your provider as well.   To learn more about what you can do with MyChart, go to [email or web address].    Your next  appointment:   4 month(s)  Provider:   You may see Lorine Bears, MD or one of the following Advanced Practice Providers on your designated Care Team:   Nicolasa Ducking, NP Eula Listen, PA-C Cadence Fransico Michael, PA-C Charlsie Quest, NP

## 2022-12-25 LAB — BASIC METABOLIC PANEL
BUN/Creatinine Ratio: 11 (ref 10–24)
BUN: 18 mg/dL (ref 8–27)
CO2: 24 mmol/L (ref 20–29)
Calcium: 9.7 mg/dL (ref 8.6–10.2)
Chloride: 100 mmol/L (ref 96–106)
Creatinine, Ser: 1.62 mg/dL — ABNORMAL HIGH (ref 0.76–1.27)
Glucose: 106 mg/dL — ABNORMAL HIGH (ref 70–99)
Potassium: 4.3 mmol/L (ref 3.5–5.2)
Sodium: 138 mmol/L (ref 134–144)
eGFR: 47 mL/min/{1.73_m2} — ABNORMAL LOW (ref >59)

## 2022-12-27 ENCOUNTER — Ambulatory Visit (INDEPENDENT_AMBULATORY_CARE_PROVIDER_SITE_OTHER): Payer: 59 | Admitting: Surgery

## 2022-12-27 ENCOUNTER — Telehealth: Payer: Self-pay | Admitting: Cardiovascular Disease

## 2022-12-27 ENCOUNTER — Encounter: Payer: Self-pay | Admitting: Surgery

## 2022-12-27 VITALS — BP 125/66 | HR 58 | Temp 98.0°F | Ht 72.0 in | Wt 199.0 lb

## 2022-12-27 DIAGNOSIS — K420 Umbilical hernia with obstruction, without gangrene: Secondary | ICD-10-CM

## 2022-12-27 DIAGNOSIS — K810 Acute cholecystitis: Secondary | ICD-10-CM | POA: Diagnosis not present

## 2022-12-27 NOTE — Patient Instructions (Addendum)
You have requested to have your gallbladder removed. This will be done at [facility] with Dr. Aleen Campi. We are looking at doing this in mid October.   We will let you know when to stop your Plavix and Aspirin based on what your Cardiologist says.   You will most likely be out of work 1-2 weeks for this surgery.  If you have FMLA or disability paperwork that needs filled out you may drop this off at our office or this can be faxed to [phone].  You will return after your post-op appointment with a lifting restriction for approximately 4 more weeks.  You will be able to eat anything you would like to following surgery. But, start by eating a bland diet and advance this as tolerated. The Gallbladder diet is below, please go as closely by this diet as possible prior to surgery to avoid any further attacks.  Please see the (blue)pre-care form that you have been given today. Our surgery scheduler will call you to verify surgery date and to go over information.   If you have any questions, please call our office.  Laparoscopic Cholecystectomy Laparoscopic cholecystectomy is surgery to remove the gallbladder. The gallbladder is located in the upper right part of the abdomen, behind the liver. It is a storage sac for bile, which is produced in the liver. Bile aids in the digestion and absorption of fats. Cholecystectomy is often done for inflammation of the gallbladder (cholecystitis). This condition is usually caused by a buildup of gallstones (cholelithiasis) in the gallbladder. Gallstones can block the flow of bile, and that can result in inflammation and pain. In severe cases, emergency surgery may be required. If emergency surgery is not required, you will have time to prepare for the procedure. Laparoscopic surgery is an alternative to open surgery. Laparoscopic surgery has a shorter recovery time. Your common bile duct may also need to be examined during the procedure. If stones are found  in the common bile duct, they may be removed. LET [facility] CARE PROVIDER KNOW ABOUT: Any allergies you have. All medicines you are taking, including vitamins, herbs, eye drops, creams, and over-the-counter medicines. Previous problems you or members of your family have had with the use of anesthetics. Any blood disorders you have. Previous surgeries you have had.  Any medical conditions you have. RISKS AND COMPLICATIONS Generally, this is a safe procedure. However, problems may occur, including: Infection. Bleeding. Allergic reactions to medicines. Damage to other structures or organs. A stone remaining in the common bile duct. A bile leak from the cyst duct that is clipped when your gallbladder is removed. The need to convert to open surgery, which requires a larger incision in the abdomen. This may be necessary if your surgeon thinks that it is not safe to continue with a laparoscopic procedure. BEFORE THE PROCEDURE Ask your health care provider about: Changing or stopping your regular medicines. This is especially important if you are taking diabetes medicines or blood thinners. Taking medicines such as aspirin and ibuprofen. These medicines can thin your blood. Do not take these medicines before your procedure if your health care provider instructs you not to. Follow instructions from your health care provider about eating or drinking restrictions. Let your health care provider know if you develop a cold or an infection before surgery. Plan to have someone take you home after the procedure. Ask your health care provider how your surgical site will be marked or identified. You may be given antibiotic medicine  to help prevent infection. PROCEDURE To reduce your risk of infection: Your health care team will wash or sanitize their hands. Your skin will be washed with soap. An IV tube may be inserted into one of your veins. You will be given a medicine to make you fall asleep  (general anesthetic). A breathing tube will be placed in your mouth. The surgeon will make several small cuts (incisions) in your abdomen. A thin, lighted tube (laparoscope) that has a tiny camera on the end will be inserted through one of the small incisions. The camera on the laparoscope will send a picture to a TV screen (monitor) in the operating room. This will give the surgeon a good view inside your abdomen. A gas will be pumped into your abdomen. This will expand your abdomen to give the surgeon more room to perform the surgery. Other tools that are needed for the procedure will be inserted through the other incisions. The gallbladder will be removed through one of the incisions. After your gallbladder has been removed, the incisions will be closed with stitches (sutures), staples, or skin glue. Your incisions may be covered with a bandage (dressing). The procedure may vary among health care providers and hospitals. AFTER THE PROCEDURE Your blood pressure, heart rate, breathing rate, and blood oxygen level will be monitored often until the medicines you were given have worn off. You will be given medicines as needed to control your pain.   This information is not intended to replace advice given to you by your health care provider. Make sure you discuss any questions you have with your health care provider.   Document Released: 04/05/2005 Document Revised: 12/25/2014 Document Reviewed: 11/15/2012 Elsevier Interactive Patient Education 2016 [employer] Inc.   Low-Fat Diet for Gallbladder Conditions A low-fat diet can be helpful if you have pancreatitis or a gallbladder condition. With these conditions, your pancreas and gallbladder have trouble digesting fats. A healthy eating plan with less fat will help rest your pancreas and gallbladder and reduce your symptoms. WHAT DO I NEED TO KNOW ABOUT THIS DIET? Eat a low-fat diet. Reduce your fat intake to less than 20-30% of your total daily  calories. This is less than 50-60 g of fat per day. Remember that you need some fat in your diet. Ask your dietician what your daily goal should be. Choose nonfat and low-fat healthy foods. Look for the words "nonfat," "low fat," or "fat free." As a guide, look on the label and choose foods with less than 3 g of fat per serving. Eat only one serving. Avoid alcohol. Do not smoke. If you need help quitting, talk with your health care provider. Eat small frequent meals instead of three large heavy meals. WHAT FOODS CAN I EAT? Grains Include healthy grains and starches such as potatoes, wheat bread, fiber-rich cereal, and brown rice. Choose whole grain options whenever possible. In adults, whole grains should account for 45-65% of your daily calories.  Fruits and Vegetables Eat plenty of fruits and vegetables. Fresh fruits and vegetables add fiber to your diet. Meats and Other Protein Sources Eat lean meat such as chicken and pork. Trim any fat off of meat before cooking it. Eggs, fish, and beans are other sources of protein. In adults, these foods should account for 10-35% of your daily calories. Dairy Choose low-fat milk and dairy options. Dairy includes fat and protein, as well as calcium.  Fats and Oils Limit high-fat foods such as fried foods, sweets, baked goods, sugary drinks.  Other Creamy sauces and condiments, such as mayonnaise, can add extra fat. Think about whether or not you need to use them, or use smaller amounts or low fat options. WHAT FOODS ARE NOT RECOMMENDED? High fat foods, such as: [employer]. Ice cream. [place] toast. Sweet rolls. Pizza. Cheese bread. Foods covered with batter, butter, creamy sauces, or cheese. Fried foods. Sugary drinks and desserts. Foods that cause gas or bloating   This information is not intended to replace advice given to you by your health care provider. Make sure you discuss any questions you have with your health care provider.    Document Released: 04/10/2013 Document Reviewed: 04/10/2013 Elsevier Interactive Patient Education [employer]

## 2022-12-27 NOTE — Progress Notes (Signed)
12/27/2022  History of Present Illness: Darryl Gilbert is a 67 y.o. male presents today for follow-up of right upper quadrant and epigastric pain.  The patient was admitted on 12/02/2022 with concerns for possible acute cholecystitis.  Initially on ultrasound there were no gallstones noted but gallbladder was distended.  CT scan was then done which did show some stranding around his gallbladder.  His LFTs are elevated and MRCP was done in the emergency room which showed sludge with a possible distal CBD stone but this was only seen on one of the series.  There was gallbladder wall thickening but it was not certain whether this was due to underdistention versus inflammation.  The patient was treated conservatively with antibiotics for possible cholecystitis.  He had a repeat ultrasound on 12/06/2022 due to some persistent symptoms the day prior but this again showed no gallstones and no wall thickening.  The day of the studies, his pain had fully resolved at that point.  HIDA scan was also done which also did not show acute cholecystitis and showed a patent cystic duct with a normal ejection fraction.  Since discharge, the patient reports that he has been having intermittent epigastric discomfort which has not been as severe as the day that he came to the hospital.  Denies any nausea or vomiting.  Discomfort is more seen in the morning but denies any troubles with p.o. intake.  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    Hyperlipidemia LDL goal <70    Hypertension    Ischemic cardiomyopathy    Smoking      Past Surgical History: Past Surgical History:  Procedure Laterality Date   COLONOSCOPY WITH PROPOFOL N/A 05/07/2022   Procedure: COLONOSCOPY WITH PROPOFOL;  Surgeon: Wyline Mood,  MD;  Location: [facility] ENDOSCOPY;  Service: Gastroenterology;  Laterality: N/A;   CORONARY/GRAFT ACUTE MI REVASCULARIZATION N/A 12/20/2020   Procedure: Coronary/Graft Acute MI Revascularization;  Surgeon: [name], MD;  Location: [facility] INVASIVE CV LAB;  Service: Cardiovascular;  Laterality: N/A;   FRACTURE SURGERY     Left leg fracture, rod placement   LEFT HEART CATH AND CORONARY ANGIOGRAPHY N/A 12/20/2020   Procedure: LEFT HEART CATH AND CORONARY ANGIOGRAPHY;  Surgeon: [name], MD;  Location: [facility] INVASIVE CV LAB;  Service: Cardiovascular;  Laterality: N/A;   LEFT HEART CATH AND CORONARY ANGIOGRAPHY N/A 02/23/2021   Procedure: LEFT HEART CATH AND CORONARY ANGIOGRAPHY;  Surgeon: [name], MD;  Location: [facility] INVASIVE CV LAB;  Service: Cardiovascular;  Laterality: N/A;    Home Medications: Prior to Admission medications   Medication Sig Start Date End Date Taking? Authorizing Provider  aspirin 81 MG chewable tablet Chew 1 tablet (81 mg total) by mouth daily. 12/26/20  Yes Glade Lloyd, MD  atorvastatin (LIPITOR) 80 MG tablet Take 1 tablet (80 mg total) by mouth daily. 01/20/22  Yes Lanier Prude, MD  buPROPion (WELLBUTRIN XL) 300 MG 24 hr tablet Take 300 mg by mouth daily. 01/20/22  Yes [provider]  clopidogrel (PLAVIX) 75 MG tablet Take 1 tablet (75 mg total) by mouth daily. 07/09/22  Yes [name], MD  esomeprazole (NEXIUM) 20 MG capsule Take 1 capsule (20 mg total) by mouth daily at 12 noon. 12/31/20  Yes  Sondra Barges, PA-C  furosemide (LASIX) 20 MG tablet Take 1 tablet (20 mg total) by mouth daily. 10/07/22  Yes [name], MD  HYDROcodone-acetaminophen (NORCO) 10-325 MG tablet Take 1 tablet by mouth 3 (three) times daily as needed. 12/25/21  Yes [provider]  metoprolol succinate (TOPROL XL) 25 MG 24 hr tablet Take 0.5 tablets (12.5 mg total) by mouth at bedtime. Patient taking differently: Take 12.5 mg by mouth in the morning.  11/09/22  Yes Riddle, Luella Cook, NP  nitroGLYCERIN (NITROSTAT) 0.4 MG SL tablet Place 1 tablet (0.4 mg total) under the tongue every 5 (five) minutes as needed for chest pain. 04/08/22  Yes Dunn, Ryan M, PA-C  sacubitril-valsartan (ENTRESTO) 24-26 MG Take 1 tablet by mouth 2 (two) times daily.   Yes [provider]  spironolactone (ALDACTONE) 25 MG tablet Take 0.5 tablets (12.5 mg total) by mouth daily. 12/17/22 03/17/23 Yes [name], MD  tamsulosin (FLOMAX) 0.4 MG CAPS capsule Take 0.4 mg by mouth daily.   Yes [provider]  umeclidinium-vilanterol (ANORO ELLIPTA) 62.5-25 MCG/ACT AEPB Inhale 1 puff into the lungs daily. 10/15/22 10/15/23 Yes Raechel Chute, MD    Allergies: Allergies  Allergen Reactions   Amiodarone Other (See Comments)    QT prolongation in the context of inferior STEMI and IV amiodarone use. Avoid all QT prolonging medications.     Review of Systems: Review of Systems  Constitutional:  Negative for chills and fever.  HENT:  Negative for hearing loss.   Respiratory:  Negative for shortness of breath.   Cardiovascular:  Negative for chest pain.  Gastrointestinal:  Positive for abdominal pain. Negative for nausea and vomiting.  Genitourinary:  Negative for dysuria.  Musculoskeletal:  Negative for myalgias.  Skin:  Negative for rash.  Neurological:  Negative for dizziness.  Psychiatric/Behavioral:  Negative for depression.     Physical Exam BP 125/66   Pulse (!) 58   Temp 98 F (36.7 C)   Ht 6' (1.829 m)   Wt 199 lb (90.3 kg)   SpO2 97%   BMI 26.99 kg/m  CONSTITUTIONAL: No acute distress, well-nourished HEENT:  Normocephalic, atraumatic, extraocular motion intact. NECK: Trachea is midline, no jugular venous distention. RESPIRATORY:  Lungs are clear, and breath sounds are equal bilaterally. Normal respiratory effort without pathologic use of accessory muscles. CARDIOVASCULAR: Heart is regular without murmurs, gallops, or rubs. GI: The  abdomen is soft, nondistended, with mild discomfort to palpation in the epigastric/upper quadrant area.  Negative Murphy's sign.  Patient also has a small incarcerated umbilical hernia with mild discomfort to deep palpation.  MUSCULOSKELETAL: Normal gait, no peripheral edema. NEUROLOGIC:  Motor and sensation is grossly normal.  Cranial nerves are grossly intact. PSYCH:  Alert and oriented to person, place and time. Affect is normal.  Labs/Imaging: Ultrasound RUQ on 12/02/2022: IMPRESSION: Distended gallbladder.  No stones.  No ductal dilatation. Minimally complex left hepatic lobe cyst measuring 11 mm  CT abdomen/pelvis on 12/02/2022: IMPRESSION: 1. Haziness around the gallbladder, without calcified stone or biliary dilatation. Acute cholecystitis not confirmed by previously performed ultrasound. Correlation with nuclear medicine hepatobiliary imaging could be obtained if indicated. 2. Diverticular disease of the left colon without acute bowel wall thickening. 3. Diffuse aneurysmal dilatation of the infrarenal abdominal aorta up to 4.1 cm. Recommend follow-up every 12 months and vascular consultation. This recommendation follows ACR consensus guidelines:  White Paper of the ACR Incidental Findings Committee II on Vascular Findings. J Am Coll Radiol 2013; 10:789-794. 4.  Aortic atherosclerosis.  MRCP on 12/02/2022: IMPRESSION: 1. 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 The gallbladder is contracted, leading to wall thickening making it difficult to assess for inflammation. There is a suggestion of some gallbladder wall thickening/edema but much of this could be secondary to contraction. There is sludge in the gallbladder without visualized gallstones. 2. No biliary dilatation. Question of a distal CBD stone on a single image, but this is not confirmed on any other imaging sequences and given the degree of artifact caused by motion, this is more likely to  be artifactual. 3. Infrarenal abdominal aortic aneurysm 4.0 cm in diameter. Recommend follow-up every 12 months and vascular consultation. This recommendation follows ACR consensus guidelines: White Paper of the ACR Incidental Findings Committee II on Vascular Findings. J Am Coll Radiol 2013; 09:811-914. 4. Dextroconvex lumbar scoliosis with lower thoracic and lumbar spondylosis and degenerative disc disease. 5. Dependent subsegmental atelectasis in the lower lobes.  Ultrasound RUQ on 12/06/2022: IMPRESSION: No acute abnormality seen in the right upper quadrant the abdomen.  HIDA scan on 12/06/2022: IMPRESSION: 1. Patent cystic duct and common bile duct without findings of cholecystitis. 2. Normal gallbladder ejection fraction without findings of gallbladder dysfunction on today's exam.   Assessment and Plan: This is a 67 y.o. male with likely acute cholecystitis.  - Discussed with patient again the findings on his initial imaging studies that were suspicious for possible cholecystitis and was treated as such conservatively.  His subsequent imaging studies were normal with a normal ultrasound and normal HIDA scan but at that point, his symptoms had fully resolved.  The patient does have a history of GERD issues but these have been under control with PPI and the symptoms that he is currently having are not the same as he had in the past with his GERD.  I do think that given the findings and symptoms, he most likely had an episode of acute cholecystitis which responded to conservative management.  Discussed with him that as such, I would recommend proceeding with cholecystectomy in the future to prevent any further episodes of this.  He is in agreement. - The patient does have a cardiac history and is currently on both aspirin and Plavix.  We will want to obtain clearance to hopefully stop both medications prior to surgery. - Discussed with the patient the plan for a robotic assisted  cholecystectomy as well as an open umbilical hernia repair.  Reviewed the surgery at length with him including the planned incisions, the risks of bleeding, infection, injury to surrounding structures, the use of ICG to better evaluate the biliary anatomy, ability to repair the umbilical hernia at the same time of surgery, that this would be an outpatient procedure, postoperative activity restrictions, pain control, and he is willing to proceed. - We will tentatively schedule the patient for surgery on 02/01/2023 pending cardiology clearance.  All of his questions have been answered.  I spent 40 minutes dedicated to the care of this patient on the date of this encounter to include pre-visit review of records, face-to-face time with the patient discussing diagnosis and management, and any post-visit coordination of care.   Howie Ill, MD [facility]

## 2022-12-27 NOTE — Telephone Encounter (Signed)
Dr. Kirke Corin , patient's chart was reviewed for preoperative cardiac evaluation.  He/she was seen by you on 12/17/2022 and according to protocol, we request that you comment on cardiac risk for upcoming laparoscoprc robotic cholecystectomy since office visit was less than 2 months ago. He is on Plavix and ASA. I can address that if you are okay with him moving forward with surgery.    Please route your response to p cv div preop.  Thank you, Joni Reining NP

## 2022-12-27 NOTE — Progress Notes (Signed)
Request for Cardiology clearance and to stop anti coagulation therapy has been faxed to Dr Kirke Corin at [facility].

## 2022-12-27 NOTE — Telephone Encounter (Signed)
   Pre-operative Risk Assessment    Patient Name: Darryl Gilbert  DOB: 1955/06/28 MRN: [id number]      Request for Surgical Clearance    Procedure:   laparoscoprc robotic cholecystectomy  Date of Surgery:  Clearance TBD    mid October                             Surgeon:  Dr Henrene Dodge Surgeon's Group or Practice Name:   [facility] Phone number:  [phone] Fax number:  [phone]   Type of Clearance Requested:   - Pharmacy:  Hold Aspirin and Clopidogrel (Plavix) prior to surgery   Type of Anesthesia:  General    Additional requests/questions:    Burnett Sheng   12/27/2022, 11:04 AM

## 2022-12-28 ENCOUNTER — Telehealth: Payer: Self-pay | Admitting: Surgery

## 2022-12-28 NOTE — Telephone Encounter (Signed)
Patient has been advised of Pre-Admission date/time, and Surgery date at [facility].  Surgery Date: 02/01/23 Preadmission Testing Date: 01/24/23 (phone 1p-4p)  Patient has been made aware to call [phone], between 1-3:00pm the day before surgery, to find out what time to arrive for surgery.

## 2022-12-29 ENCOUNTER — Other Ambulatory Visit: Payer: Self-pay | Admitting: *Deleted

## 2022-12-29 DIAGNOSIS — I5022 Chronic systolic (congestive) heart failure: Secondary | ICD-10-CM

## 2022-12-29 NOTE — Telephone Encounter (Signed)
     Primary Cardiologist: Lorine Bears, MD  Chart reviewed as part of pre-operative protocol coverage. Given past medical history and time since last visit, based on ACC/AHA guidelines, RIYAN FARON would be at acceptable risk for the planned procedure without further cardiovascular testing.   Per Dr. Kirke Corin  "He is at low risk from a cardiac standpoint. Plavix can be held 5 days before surgery.  Aspirin 81 mg once daily should be continued."  I will route this recommendation to the requesting party via Epic fax function and remove from pre-op pool.  Please call with questions.  Thomasene Ripple. Zaiden Ludlum NP-C     12/29/2022, 4:03 PM [facility] [address] Office [phone] Fax [phone]

## 2022-12-29 NOTE — Telephone Encounter (Signed)
He is at low risk from a cardiac standpoint. Plavix can be held 5 days before surgery.  Aspirin 81 mg once daily should be continued.

## 2022-12-30 NOTE — Progress Notes (Unsigned)
Cardiology clearance received from Dr Kirke Corin. The patient is cleared at Low risk for the procedure. He may hold his Plavix for 5 days prior to surgery. He should continue to take his 81 mg Aspirin. The patient has been notified of his instructions regarding this. All notes are in Epic.

## 2023-01-15 ENCOUNTER — Other Ambulatory Visit: Payer: Self-pay | Admitting: Cardiology

## 2023-01-24 ENCOUNTER — Encounter
Admission: RE | Admit: 2023-01-24 | Discharge: 2023-01-24 | Disposition: A | Discharge status: Home / Self Care | Payer: 59 | Source: Ambulatory Visit | Attending: Surgery | Admitting: Surgery

## 2023-01-24 VITALS — Ht 72.0 in | Wt 199.1 lb

## 2023-01-24 DIAGNOSIS — Z01812 Encounter for preprocedural laboratory examination: Secondary | ICD-10-CM

## 2023-01-24 DIAGNOSIS — I2510 Atherosclerotic heart disease of native coronary artery without angina pectoris: Secondary | ICD-10-CM

## 2023-01-24 DIAGNOSIS — Z79899 Other long term (current) drug therapy: Secondary | ICD-10-CM

## 2023-01-24 HISTORY — DX: Paroxysmal atrial fibrillation: I48.0

## 2023-01-24 HISTORY — DX: Diverticulosis of intestine, part unspecified, without perforation or abscess without bleeding: K57.90

## 2023-01-24 HISTORY — DX: Chronic kidney disease, stage 3 unspecified: N18.30

## 2023-01-24 HISTORY — DX: Gastro-esophageal reflux disease without esophagitis: K21.9

## 2023-01-24 HISTORY — DX: Polyp of colon: K63.5

## 2023-01-24 HISTORY — DX: Acute cholecystitis: K81.0

## 2023-01-24 HISTORY — DX: Chronic obstructive pulmonary disease, unspecified: J44.9

## 2023-01-24 NOTE — Patient Instructions (Signed)
Your procedure is scheduled on:02-01-23 Tuesday Report to the Registration Desk on the 1st floor of the Medical Mall.Then proceed to the 2nd floor Surgery Desk To find out your arrival time, please call [phone] between 1PM - 3PM on:01-31-23 Monday If your arrival time is 6:00 am, do not arrive before that time as the Medical Mall entrance doors do not open until 6:00 am.  REMEMBER: Instructions that are not followed completely may result in serious medical risk, up to and including death; or upon the discretion of your surgeon and anesthesiologist your surgery may need to be rescheduled.  Do not eat food after midnight the night before surgery.  No gum chewing or hard candies.  You may however, drink CLEAR liquids up to 2 hours before you are scheduled to arrive for your surgery. Do not drink anything within 2 hours of your scheduled arrival time.  Clear liquids include: - water  - apple juice without pulp - gatorade (not RED colors) - black coffee or tea (Do NOT add milk or creamers to the coffee or tea) Do NOT drink anything that is not on this list.  One week prior to surgery: Stop Anti-inflammatories (NSAIDS) such as Advil, Aleve, Ibuprofen, Motrin, Naproxen, Naprosyn and Aspirin based products such as Excedrin, Goody's Powder, BC Powder.You may however, take Tylenol if needed for pain up until the day of surgery. Stop ANY OVER THE COUNTER supplements/vitamins NOW (01-24-23) until after surgery.   Continue taking all prescribed medications with the exception of the following: -clopidogrel (PLAVIX)-Stop 5 days prior to surgery-Last dose will be on 01-26-23 (Wednesday)  TAKE ONLY THESE MEDICATIONS THE MORNING OF SURGERY WITH A SIP OF WATER: -atorvastatin (LIPITOR)  -buPROPion (WELLBUTRIN XL)  -metoprolol succinate (TOPROL XL)  -HYDROcodone-acetaminophen (NORCO)  -esomeprazole (NEXIUM)-take one the night before and one on the morning of surgery - helps to prevent nausea after  surgery.)  Use your Anoro Ellipta Inhaler the day of surgery  Continue your 81 mg Aspirin up until the day prior to surgery-Do NOT take the morning of surgery   No Alcohol for 24 hours before or after surgery.  No Smoking including e-cigarettes for 24 hours before surgery.  No chewable tobacco products for at least 6 hours before surgery.  No nicotine patches on the day of surgery.  Do not use any "recreational" drugs for at least a week (preferably 2 weeks) before your surgery.  Please be advised that the combination of cocaine and anesthesia may have negative outcomes, up to and including death. If you test positive for cocaine, your surgery will be cancelled.  On the morning of surgery brush your teeth with toothpaste and water, you may rinse your mouth with mouthwash if you wish. Do not swallow any toothpaste or mouthwash.  Use CHG Soap as directed on instruction sheet.  Do not wear jewelry, make-up, hairpins, clips or nail polish.  For welded (permanent) jewelry: bracelets, anklets, waist bands, etc.  Please have this removed prior to surgery.  If it is not removed, there is a chance that hospital personnel will need to cut it off on the day of surgery.  Do not wear lotions, powders, or perfumes.   Do not shave body hair from the neck down 48 hours before surgery.  Contact lenses, hearing aids and dentures may not be worn into surgery.  Do not bring valuables to the hospital. [facility] is not responsible for any missing/lost belongings or valuables.   Notify your doctor if there is any  change in your medical condition (cold, fever, infection).  Wear comfortable clothing (specific to your surgery type) to the hospital.  After surgery, you can help prevent lung complications by doing breathing exercises.  Take deep breaths and cough every 1-2 hours. Your doctor may order a device called an Incentive Spirometer to help you take deep breaths. When coughing or sneezing, hold  a pillow firmly against your incision with both hands. This is called "splinting." Doing this helps protect your incision. It also decreases belly discomfort.  If you are being admitted to the hospital overnight, leave your suitcase in the car. After surgery it may be brought to your room.  In case of increased patient census, it may be necessary for you, the patient, to continue your postoperative care in the Same Day Surgery department.  If you are being discharged the day of surgery, you will not be allowed to drive home. You will need a responsible individual to drive you home and stay with you for 24 hours after surgery.   If you are taking public transportation, you will need to have a responsible individual with you.  Please call the Pre-admissions Testing Dept. at [phone] if you have any questions about these instructions.  Surgery Visitation Policy:  Patients having surgery or a procedure may have two visitors.  Children under the age of 36 must have an adult with them who is not the patient.     Preparing for Surgery with CHLORHEXIDINE GLUCONATE (CHG) Soap  Chlorhexidine Gluconate (CHG) Soap  o An antiseptic cleaner that kills germs and bonds with the skin to continue killing germs even after washing  o Used for showering the night before surgery and morning of surgery  Before surgery, you can play an important role by reducing the number of germs on your skin.  CHG (Chlorhexidine gluconate) soap is an antiseptic cleanser which kills germs and bonds with the skin to continue killing germs even after washing.  Please do not use if you have an allergy to CHG or antibacterial soaps. If your skin becomes reddened/irritated stop using the CHG.  1. Shower the NIGHT BEFORE SURGERY and the MORNING OF SURGERY with CHG soap.  2. If you choose to wash your hair, wash your hair first as usual with your normal shampoo.  3. After shampooing, rinse your hair and body  thoroughly to remove the shampoo.  4. Use CHG as you would any other liquid soap. You can apply CHG directly to the skin and wash gently with a scrungie or a clean washcloth.  5. Apply the CHG soap to your body only from the neck down. Do not use on open wounds or open sores. Avoid contact with your eyes, ears, mouth, and genitals (private parts). Wash face and genitals (private parts) with your normal soap.  6. Wash thoroughly, paying special attention to the area where your surgery will be performed.  7. Thoroughly rinse your body with warm water.  8. Do not shower/wash with your normal soap after using and rinsing off the CHG soap.  9. Pat yourself dry with a clean towel.  10. Wear clean pajamas to bed the night before surgery.  12. Place clean sheets on your bed the night of your first shower and do not sleep with pets.  13. Shower again with the CHG soap on the day of surgery prior to arriving at the hospital.  14. Do not apply any deodorants/lotions/powders.  15. Please wear clean clothes to the hospital.

## 2023-01-25 ENCOUNTER — Encounter
Admission: RE | Admit: 2023-01-25 | Discharge: 2023-01-25 | Disposition: A | Discharge status: Home / Self Care | Payer: 59 | Source: Ambulatory Visit | Attending: Surgery | Admitting: Surgery

## 2023-01-25 DIAGNOSIS — I2510 Atherosclerotic heart disease of native coronary artery without angina pectoris: Secondary | ICD-10-CM | POA: Diagnosis not present

## 2023-01-25 DIAGNOSIS — Z01812 Encounter for preprocedural laboratory examination: Secondary | ICD-10-CM | POA: Insufficient documentation

## 2023-01-25 DIAGNOSIS — Z79899 Other long term (current) drug therapy: Secondary | ICD-10-CM | POA: Diagnosis not present

## 2023-01-25 DIAGNOSIS — Z01818 Encounter for other preprocedural examination: Secondary | ICD-10-CM | POA: Diagnosis present

## 2023-01-25 LAB — BASIC METABOLIC PANEL
Anion gap: 12 (ref 5–15)
BUN: 20 mg/dL (ref 8–23)
CO2: 26 mmol/L (ref 22–32)
Calcium: 9.5 mg/dL (ref 8.9–10.3)
Chloride: 99 mmol/L (ref 98–111)
Creatinine, Ser: 1.38 mg/dL — ABNORMAL HIGH (ref 0.61–1.24)
GFR, Estimated: 56 mL/min — ABNORMAL LOW (ref >60)
Glucose, Bld: 116 mg/dL — ABNORMAL HIGH (ref 70–99)
Potassium: 3.9 mmol/L (ref 3.5–5.1)
Sodium: 137 mmol/L (ref 135–145)

## 2023-01-27 ENCOUNTER — Encounter: Payer: Self-pay | Admitting: Surgery

## 2023-01-28 ENCOUNTER — Encounter: Payer: Self-pay | Admitting: Surgery

## 2023-01-28 NOTE — Progress Notes (Signed)
Perioperative / Anesthesia Services  Pre-Admission Testing Clinical Review / Pre-Operative Anesthesia Consult  Date: 01/31/23  Patient Demographics:  Name: Darryl Gilbert DOB:   [birth year] MRN:   [id number]  Planned Surgical Procedure(s):    Case: [id number] Date/Time: 02/01/23 0715   Procedures:      XI ROBOTIC ASSISTED LAPAROSCOPIC CHOLECYSTECTOMY     INDOCYANINE GREEN FLUORESCENCE IMAGING (ICG)     HERNIA REPAIR UMBILICAL ADULT, open   Anesthesia type: General   Pre-op diagnosis:      cholecystitis      incarcerated umbilical hernia less 3 cm   Location: [facility] OR ROOM 06 / [facility]   Surgeons: Henrene Dodge, MD     NOTE: Available PAT nursing documentation and vital signs have been reviewed. Clinical nursing staff has updated patient's PMH/PSHx, current medication list, and drug allergies/intolerances to ensure comprehensive history available to assist in medical decision making as it pertains to the aforementioned surgical procedure and anticipated anesthetic course. Extensive review of available clinical information personally performed. [place] PMH and PSHx updated with any diagnoses/procedures that  may have been inadvertently omitted during his intake with the pre-admission testing department's nursing staff.  Clinical Discussion:  Darryl Gilbert is a 67 y.o. male who is submitted for pre-surgical anesthesia review and clearance prior to him undergoing the above procedure. Patient is a Former Smoker (67.5 pack years; quit 12/2020). Pertinent PMH includes: CAD, inferior STEMI, ischemic cardiomyopathy, HFrEF, PAF, prolonged QT interval with TdeP, palpitations, infrarenal AAA, asymptomatic bradycardia, NSVT, PSVT, aortic atherosclerosis, HTN, HLD, CKD-III, COPD, GERD (on daily PPI), chronic back pain, BPH, incarcerated umbilical hernia.  Patient is followed by cardiology Kirke Corin, MD). He was last seen in the cardiology clinic on 12/17/2022; notes reviewed. At the  time of his clinic visit, patient was being seen in clinic following hospitalization for cholecystitis.  During his admission, patient with asymptomatic bradycardia.  Initially, beta-blocker dose was held, however patient with increased palpitations and shortness of breath.  Beta-blocker therapy was restarted at time of discharge.  Since that time, patient doing well overall.  He denied any episodes of chest pain, however he was continuing to experience chronic exertional dyspnea that was reported to be stable and at baseline.  Patient denied any PND, orthopnea, significant peripheral edema, vertiginous symptoms, or presyncope/syncope. Patient with a past medical history significant for cardiovascular diagnoses. Documented physical exam was grossly benign, providing no evidence of acute exacerbation and/or decompensation of the patient's known cardiovascular conditions.  Patient suffered an acute inferior wall STEMI on 12/20/2020.    Diagnostic LEFT heart catheterization was performed revealing a moderately reduced left ventricular systolic function with an EF of 35-45%.  LVEDP elevated at 22 mmHg.  There was multivessel CAD; 70% ostial-proximal RCA, 100% thrombotic occlusion of the proximal-mid LCx, and 60% OM1.  LCx was identified as the culprit lesion with suspected vasospasm.  PCI was subsequently performed placing a 3.0 x 22 mm Onyx Frontier DES to the proximal to mid LCx lesion yielding excellent angiographic result and TIMI-3 flow.  Procedure was complicated by the development of atrial fibrillation.  Patient was started on amiodarone drip, which resulted in prolonged QTc with development of polymorphic ventricular tachycardia (Torsades de Pointes) requiring multiple defibrillations. Amiodarone drip was discontinued.  Following STEMI, patient with resulting ischemic cardiomyopathy and associated HFrEF.  Cardiac function was assessed by repeat echocardiograms on both 12/21/2020 and 12/22/2020.  LVEF did  improve somewhat to 45-50%.    Repeat diagnostic LEFT  heart catheterization was performed on 02/23/2021, again revealing moderately reduced left ventricular systolic function with an EF of 35-45%.  EDP had improved to 7 mmHg.  There was multivessel CAD; 50% OM1, 80% distal LAD, 50% LPAV, and 40% ostial-proximal RCA.  Interventional cardiology made the decision to defer further intervention opting for aggressive medical management.  Myocardial perfusion imaging study was performed on 05/06/2022 revealing a mild to moderately reduced left ventricular systolic function with an EF of 48%.  There was hypokinesis of the basal to mid lateral wall.  There was GI uptake artifact noted.  Moderate in size fixed perfusion defect in the basal to mid lateral wall was observed consistent with patient's prior MI.  There was no evidence of stress-induced ECG changes or significant ischemia.  CT attenuation correction images with coronary artery disease with stents present in the LCx and mild coronary calcification in the LAD.  Study was determined to be low risk overall.  Most recent TTE was performed on 04/27/2022 revealing a moderately reduced left ventricular systolic function with an EF of 35-40%.  There was global hypokinesis with severe hypokinesis of the inferior wall.  Left ventricle was mildly dilated. Left ventricular diastolic Doppler parameters consistent with pseudonormalization (G2DD).  Right ventricular size and function normal.  There was mild to moderate mitral valve regurgitation. All transvalvular gradients were noted to be normal providing no evidence suggestive of valvular stenosis. Aorta normal in size with no evidence of aneurysmal dilatation.  Again, patient with a history of palpitations.  He has undergone multiple long-term cardiac event monitor studies that revealed significant PAC burden's.  Additionally, studies have revealed multiple runs of both NSVT and PSVT.  Most recent long-term cardiac  event monitor study was performed on 11/04/2022, which demonstrated overall improvement of his SVT burden.  Most recent Zio patch study was performed on 11/04/2022 revealing a single 4 beat run of NSVT, in addition to 30 runs of PSVT with the longest lasting 26.5 seconds.  Overall PAC burden down to 8.4% (previously 17.3%) with rare PVCs.  CT imaging of the abdomen and pelvis was performed on 12/02/2022 making an incidental finding of a 4.1 cm infrarenal AAA.  Patient with an atrial fibrillation diagnosis; CHA2DS2-VASc Score = 4 (age, HFrEF, HTN, previous MI). His rate and rhythm are currently being maintained on oral metoprolol succinate. Patient is not on chronic oral anticoagulation therapy, however patient is on daily DAPT therapy using ASA + clopidogrel. Patient reported to be compliant with therapy with no evidence or concerns of GI/GU related bleeding. Ischemic cardiomyopathy and resulting HFrEF being managed on currently prescribed diuretic (furosemide + spironolactone), beta blocker (metoprolol), and ARB/ANRI (Entresto) therapies; blood pressure documented at 118/84 mmHg. Discussed possible addition of SGLT2i therapy in the near future. Patient is on atorvastatin for his HLD diagnosis and ASCVD prevention. Patient is not diabetic. He does not have an OSAH diagnosis. Patient is able to complete all of his  ADL/IADLs without cardiovascular limitation.  Per the DASI, patient is able to achieve at least 4 METS of physical activity without experiencing any significant degree of angina/anginal equivalent symptoms. No changes were made to his medication regimen during his visit with cardiology.  Patient scheduled to follow-up with outpatient cardiology in 4 months or sooner if needed.  Selena Lesser is scheduled for an XI ROBOTIC ASSISTED LAPAROSCOPIC CHOLECYSTECTOMY; INDOCYANINE GREEN FLUORESCENCE IMAGING (ICG); HERNIA REPAIR UMBILICAL ADULT on 02/01/2023 with Dr. Ernesto Rutherford, MD.  Given patient's past  medical history significant for cardiovascular  diagnoses, presurgical cardiac clearance was sought by the PAT team.  Per cardiology, "based ACC/AHA guidelines, the patient's past medical history, and the amount of time since his last clinic visit, this patient would be at an overall LOW risk for the planned procedure without further cardiovascular testing or intervention at this time".   Again, this patient is on daily DAPT therapy.  Per recommendations from cardiology, patient to hold his clopidogrel for 5 days prior to his procedure with plans to restart since postoperatively respectively minimized by his primary attending surgeon.  Patient is aware that his last dose of clopidogrel should be on 01/26/2023.  Given patient's cardiovascular history, patient will continue his daily low-dose ASA throughout his perioperative course.  Patient denies previous perioperative complications with anesthesia in the past. In review of the available records, it is noted that patient underwent a general anesthetic course here at [facility] (ASA III) in 04/2022 without documented complications.      01/24/2023    1:00 PM 12/27/2022   10:33 AM 12/17/2022    8:29 AM  Vitals with BMI  Height 6\' 0"  6\' 0"  6\' 0"   Weight 199 lbs 1 oz 199 lbs 198 lbs 6 oz  BMI 26.99 26.98 26.9  Systolic  125 118  Diastolic  66 84  Pulse  58 51    Providers/Specialists:   NOTE: Primary physician provider listed below. Patient may have been seen by APP or partner within same practice.   PROVIDER ROLE / SPECIALTY LAST Eligha Bridegroom, MD General Surgery (Surgeon) 12/27/2022  Dortha Kern, MD Primary Care Provider ???  Lorine Bears, MD Cardiology 10/15/2022; update preop APP call on 12/29/2022  Steffanie Dunn, MD Electrophysiology  11/17/2022   Allergies:  Amiodarone  Current Home Medications:   No current facility-administered medications for this encounter.    aspirin 81 MG chewable  tablet   atorvastatin (LIPITOR) 80 MG tablet   buPROPion (WELLBUTRIN XL) 300 MG 24 hr tablet   clopidogrel (PLAVIX) 75 MG tablet   esomeprazole (NEXIUM) 20 MG capsule   furosemide (LASIX) 20 MG tablet   HYDROcodone-acetaminophen (NORCO) 10-325 MG tablet   metoprolol succinate (TOPROL XL) 25 MG 24 hr tablet   nitroGLYCERIN (NITROSTAT) 0.4 MG SL tablet   sacubitril-valsartan (ENTRESTO) 24-26 MG   spironolactone (ALDACTONE) 25 MG tablet   tamsulosin (FLOMAX) 0.4 MG CAPS capsule   umeclidinium-vilanterol (ANORO ELLIPTA) 62.5-25 MCG/ACT AEPB   History:   Past Medical History:  Diagnosis Date   Abdominal aortic aneurysm (AAA) (HCC)    Acute cholecystitis    Acute ST elevation myocardial infarction (STEMI) of inferior wall (HCC) 12/20/2020   a.) LHC/PCI 12/20/2020: EF 35-45%. LVEDP = 22. 70% o-pRCA, 100% thrombotic occlusion p-mLCx with suspected vasospasm (3.0 x 22 mm Onyx Frontier DES), 60% OM1. Case complicated by development of A.fib --> amiodarone gtt started --> developed prolonged QTc and polymorphic VT requiring multiple defibfrillations --> amiodarone discontinued.   Aortic atherosclerosis (HCC)    Asymptomatic bradycardia    Back pain    BPH (benign prostatic hyperplasia)    CKD (chronic kidney disease), stage III (HCC)    Colon polyp    COPD (chronic obstructive pulmonary disease) (HCC)    Coronary artery disease 12/20/2020   a.) inf STEMI 12/20/2020 --> LHC/PCI: 70% o-pRCA, 100% thrombotic occlusion p-mLCx with suspected vasospasm (3.0 x 22 mm Onyx Frontier DES), 60% OM1; b.) LHC 02/23/2021: 50% OM1, 80% dLAD, 50% LPAV, 40% o-pRCA - med mgmt  Diverticular disease    Edentulous    GERD (gastroesophageal reflux disease)    Hepatic cyst    HFrEF (heart failure with reduced ejection fraction) (HCC)    a.) TTE 12/21/2020: EF 50%, mild LVH, mod inferolat HK, G2DD; b.) TTE 12/22/2020: EF 45-50%, inferolat HK, mild LVH; c.) TTE 06/25/2021: EF 45%, mild LCH, G2DD; d.) TTE 04/27/2022:  EF 35-40%, glob HK (sev inf HK), mild LVE, G2DD, mild-mod MR   History of 2019 novel coronavirus disease (COVID-19) 04/2022   a.) rapid home Ag testing (+)   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 01/08/2021   a.) occurred in setting of inferior STEMI (during LHC) --> developed A.fib --> amiodarone gtt started --> developed prolonged QTc and polymorphic VT requiring multiple defibrillations --> amiodarone discontinued   Hyperlipidemia LDL goal <70    Hypertension    Incarcerated umbilical hernia    Infrarenal abdominal aortic aneurysm (AAA) without rupture (HCC) 12/02/2022   a.) CT A/P 12/02/2022: 4.1 cm   Ischemic cardiomyopathy 12/20/2020   a.) LHC 12/20/2020: EF 35-45%; b.) TTE 12/21/2020: EF 50%, c.) TTE 12/22/2020: EF 45-50%; d.) LHC 02/23/2021: EF 35-45%; e.) TTE 06/25/2021: EF 45%; f.) TTE 04/27/2022: EF 35-40%; g.) MV 05/06/2022: EF 48%   Long term current use of aspirin    Long term current use of clopidogrel    NSVT (nonsustained ventricular tachycardia) (HCC) 05/08/2021   a.) Zio 04/28/2021: 5 runs with the fastest lasting 12 beats (max rate 190); b.) live tele study 06/23/2021: 5 runs with longested lasting 10 beasts (max rate 103); c.) Zio 08/31/2022: 50 runs with longest lasting 13.8 seconds (max rate 159); d.) Zio 11/04/2022: 1 run lasting 4 beats   PAF (paroxysmal atrial fibrillation) (HCC)    a.) CHA2DS2-VASc = 4 (age, HFrEF, HTN, previous MI) as of 01/28/2023; b.) cardiac rate/rhythm maintained on oral metoprolol succinate; no chronically anticoagulation (is on DAPT)   Palpitations (PAC/PVCs)    a.) live tele study 06/23/2021: freq PACs (11.6% burden) and occassional PVCs (2.9% burden); b.) Zio 08/31/2022: freq PACs (17.3% burden) and occassional PVCs (2.4% burden); c.) Zio 11/04/2022: improved PAC burden down to 8.4% with rare PVCs   PSVT (paroxysmal supraventricular tachycardia) (HCC) 04/28/2021   a.) Zio 04/28/2021: 119 runs with fastest  lasting 7 beats (max rate 200) and longest lasting 13.7 seconds (max rate 122); b.) live tele study 06/23/2021: 109 runs with longest lasting 29.4 seconds (max rate 100); c.) Zio 08/31/2022: 196 runs with longest lasting 22.1 seconds (max rate 166); d.) Zio 30 runs with longest lasting 26.5 seconds   Smoking    Past Surgical History:  Procedure Laterality Date   COLONOSCOPY WITH PROPOFOL N/A 05/07/2022   Procedure: COLONOSCOPY WITH PROPOFOL;  Surgeon: Wyline Mood, MD;  Location: [facility] ENDOSCOPY;  Service: Gastroenterology;  Laterality: N/A;   CORONARY/GRAFT ACUTE MI REVASCULARIZATION N/A 12/20/2020   Procedure: Coronary/Graft Acute MI Revascularization;  Surgeon: [name], MD;  Location: [facility] INVASIVE CV LAB;  Service: Cardiovascular;  Laterality: N/A;   FRACTURE SURGERY     Left leg fracture, rod placement   INGUINAL HERNIA REPAIR Bilateral    LEFT HEART CATH AND CORONARY ANGIOGRAPHY N/A 12/20/2020   Procedure: LEFT HEART CATH AND CORONARY ANGIOGRAPHY;  Surgeon: [name], MD;  Location: [facility] INVASIVE CV LAB;  Service: Cardiovascular;  Laterality: N/A;   LEFT HEART CATH AND CORONARY ANGIOGRAPHY N/A 02/23/2021   Procedure: LEFT HEART CATH AND CORONARY ANGIOGRAPHY;  Surgeon: [name], MD;  Location: [facility] INVASIVE CV LAB;  Service: Cardiovascular;  Laterality: N/A;   No family history on file. Social History   Tobacco Use   Smoking status: Former    Current packs/day: 0.00    Average packs/day: 1.5 packs/day for 45.0 years (67.5 ttl pk-yrs)    Types: Cigarettes    Start date: 12/21/1975    Quit date: 12/20/2020    Years since quitting: 2.1    Passive exposure: Past   Smokeless tobacco: Never  Vaping Use   Vaping status: Never Used  Substance Use Topics   Alcohol use: Not Currently   Drug use: Never    Pertinent Clinical Results:  LABS:   Lab Results  Component Value Date   WBC 6.5 12/07/2022   HGB 15.1 12/07/2022   HCT 43.8 12/07/2022   MCV 90.3  12/07/2022   PLT 197 12/07/2022   Lab Results  Component Value Date   NA 137 01/25/2023   K 3.9 01/25/2023   CO2 26 01/25/2023   GLUCOSE 116 (H) 01/25/2023   BUN 20 01/25/2023   CREATININE 1.38 (H) 01/25/2023   CALCIUM 9.5 01/25/2023   EGFR 47 (L) 12/24/2022   GFRNONAA 56 (L) 01/25/2023    ECG: Date: 12/17/2022 Time ECG obtained: 0833 AM Rate: 51 bpm Rhythm: sinus bradycardia Axis (leads I and aVF): Normal Intervals: PR 148 ms. QRS 88 ms. QTc 416 ms. ST segment and T wave changes: No evidence of acute ST segment elevation or depression.   Comparison: Similar to previous tracing obtained on 12/02/2022   IMAGING / PROCEDURES: LONG TERM CARDIAC EVENT MONITOR STUDY performed on 11/04/2022 HR 38 - 179 bpm, average 53 bpm. 1 NSVT lasting 4 beats. 30 nonsustained SVT, longest 26.5 seconds.  Frequent supraventricular ectopy, 8.4%. Rare ventricular ectopy. No atrial fibrillation. No sustained arrhythmias. Improved burden of SVT and supraventricular ectopy compared to prior monitor.  PULMONARY FUNCTION TESTING performed on 10/12/2022    Latest Ref Rng & Units 10/12/2022    8:36 AM  PFT Results  FVC-Pre L 3.87   FVC-Predicted Pre % 78   FVC-Post L 3.96   FVC-Predicted Post % 80   Pre FEV1/FVC % % 69   Post FEV1/FCV % % 68   FEV1-Pre L 2.68   FEV1-Predicted Pre % 72   FEV1-Post L 2.71   DLCO uncorrected ml/min/mmHg 16.02   DLCO UNC% % 56   DLVA Predicted % 57   TLC L 7.79   TLC % Predicted % 105   RV % Predicted % 150    MYOCARDIAL PERFUSION IMAGING STUDY (LEXISCAN) performed on 05/06/2022 Pharmacological myocardial perfusion imaging study with no significant  ischemia Moderate-sized fixed perfusion defect in the basal to mid lateral wall consistent with prior MI Hypokinesis of the basal to mid lateral wall, EF estimated at 48%, GI uptake artifact noted No EKG changes concerning for ischemia at peak stress or in recovery. CT attenuation correction images with  coronary disease and stenting in the left circumflex, mild coronary calcification in the LAD, no significant aortic atherosclerosis Low risk scan  TRANSTHORACIC ECHOCARDIOGRAM performed on 04/27/2022 Left ventricular ejection fraction, by estimation, is 35 to 40%. The left ventricle has moderately decreased function. The left ventricle demonstrates global hypokinesis with severe hypokinesis of the inferior wall. The left ventricular internal cavity size was mildly dilated. Left ventricular diastolic parameters are consistent with Grade II diastolic dysfunction (pseudonormalization).  Right ventricular systolic function is normal. The right ventricular size is normal.  The mitral valve is  normal in structure. Mild to moderate mitral valve regurgitation. No evidence of mitral stenosis.  The aortic valve has an indeterminant number of cusps. Aortic valve regurgitation is not visualized. No aortic stenosis is present.  The inferior vena cava is normal in size with greater than 50% respiratory variability, suggesting right atrial pressure of 3 mmHg.   LEFT HEART CATHETERIZATION AND CORONARY ANGIOGRAPHY performed on 02/23/2021 Moderately reduced left ventricular systolic function with an EF of 35-45% Normal LVEDP Multivessel CAD 50% OM1 80% distal LAD 50% LPAV 40% ostial-proximal RCA Nonstenotic proximal-mid LCx previously treated Recommendations Recommend continued medical therapy for coronary artery disease. The patient was noted to be bradycardic on presentation with heart rate in the low 40s.  Thus, I elected to discontinue metoprolol. The exact etiology for his continued shortness of breath is not entirely clear but there could be a component of physical deconditioning.   In addition, he does have cardiomyopathy and we will have need to treat him medically for heart failure.  His LVEDP was normal.   Impression and Plan:  Darryl Gilbert has been referred for pre-anesthesia review and clearance  prior to him undergoing the planned anesthetic and procedural courses. Available labs, pertinent testing, and imaging results were personally reviewed by me in preparation for upcoming operative/procedural course. [facility] medical record has been updated following extensive record review and patient interview with PAT staff.   This patient has been appropriately cleared by cardiology with an overall LOW risk of experiencing significant perioperative cardiovascular complications. Based on clinical review performed today (01/31/23), barring any significant acute changes in the patient's overall condition, it is anticipated that he will be able to proceed with the planned surgical intervention. Any acute changes in clinical condition may necessitate his procedure being postponed and/or cancelled. Patient will meet with anesthesia team (MD and/or CRNA) on the day of his procedure for preoperative evaluation/assessment. Questions regarding anesthetic course will be fielded at that time.   Pre-surgical instructions were reviewed with the patient during his PAT appointment, and questions were fielded to satisfaction by PAT clinical staff. He has been instructed on which medications that he will need to hold prior to surgery, as well as the ones that have been deemed safe/appropriate to take on the day of his procedure. As part of the general education provided by PAT, patient made aware both verbally and in writing, that he would need to abstain from the use of any illegal substances during his perioperative course.  He was advised that failure to follow the provided instructions could necessitate case cancellation or result in serious perioperative complications up to and including death. Patient encouraged to contact PAT and/or his surgeon's office to discuss any questions or concerns that may arise prior to surgery; verbalized understanding.   Quentin Mulling, MSN, APRN, FNP-C, CEN [facility]   Perioperative Services Nurse Practitioner Phone: [phone] Fax: [phone] 01/31/23 9:07 AM  NOTE: This note has been prepared using Dragon dictation software. Despite my best ability to proofread, there is always the potential that unintentional transcriptional errors may still occur from this process.

## 2023-02-01 ENCOUNTER — Other Ambulatory Visit: Payer: Self-pay

## 2023-02-01 ENCOUNTER — Ambulatory Visit: Payer: 59 | Admitting: Urgent Care

## 2023-02-01 ENCOUNTER — Ambulatory Visit
Admission: RE | Admit: 2023-02-01 | Discharge: 2023-02-01 | Disposition: A | Discharge status: Home / Self Care | Payer: 59 | Attending: Surgery | Admitting: Surgery

## 2023-02-01 ENCOUNTER — Encounter: Payer: Self-pay | Admitting: Surgery

## 2023-02-01 ENCOUNTER — Encounter
Admission: RE | Disposition: A | Discharge status: Home / Self Care | Payer: Self-pay | Source: Home / Self Care | Attending: Surgery

## 2023-02-01 DIAGNOSIS — I2510 Atherosclerotic heart disease of native coronary artery without angina pectoris: Secondary | ICD-10-CM | POA: Insufficient documentation

## 2023-02-01 DIAGNOSIS — Z955 Presence of coronary angioplasty implant and graft: Secondary | ICD-10-CM | POA: Diagnosis not present

## 2023-02-01 DIAGNOSIS — I255 Ischemic cardiomyopathy: Secondary | ICD-10-CM | POA: Diagnosis not present

## 2023-02-01 DIAGNOSIS — K219 Gastro-esophageal reflux disease without esophagitis: Secondary | ICD-10-CM | POA: Diagnosis not present

## 2023-02-01 DIAGNOSIS — K810 Acute cholecystitis: Secondary | ICD-10-CM

## 2023-02-01 DIAGNOSIS — K420 Umbilical hernia with obstruction, without gangrene: Secondary | ICD-10-CM

## 2023-02-01 DIAGNOSIS — N183 Chronic kidney disease, stage 3 unspecified: Secondary | ICD-10-CM | POA: Insufficient documentation

## 2023-02-01 DIAGNOSIS — I252 Old myocardial infarction: Secondary | ICD-10-CM | POA: Insufficient documentation

## 2023-02-01 DIAGNOSIS — I5022 Chronic systolic (congestive) heart failure: Secondary | ICD-10-CM | POA: Insufficient documentation

## 2023-02-01 DIAGNOSIS — Z87891 Personal history of nicotine dependence: Secondary | ICD-10-CM | POA: Diagnosis not present

## 2023-02-01 DIAGNOSIS — I480 Paroxysmal atrial fibrillation: Secondary | ICD-10-CM | POA: Insufficient documentation

## 2023-02-01 DIAGNOSIS — E785 Hyperlipidemia, unspecified: Secondary | ICD-10-CM | POA: Insufficient documentation

## 2023-02-01 DIAGNOSIS — Z7902 Long term (current) use of antithrombotics/antiplatelets: Secondary | ICD-10-CM | POA: Diagnosis not present

## 2023-02-01 DIAGNOSIS — I7143 Infrarenal abdominal aortic aneurysm, without rupture: Secondary | ICD-10-CM | POA: Insufficient documentation

## 2023-02-01 DIAGNOSIS — I130 Hypertensive heart and chronic kidney disease with heart failure and stage 1 through stage 4 chronic kidney disease, or unspecified chronic kidney disease: Secondary | ICD-10-CM | POA: Diagnosis not present

## 2023-02-01 DIAGNOSIS — N400 Enlarged prostate without lower urinary tract symptoms: Secondary | ICD-10-CM | POA: Insufficient documentation

## 2023-02-01 DIAGNOSIS — J449 Chronic obstructive pulmonary disease, unspecified: Secondary | ICD-10-CM | POA: Diagnosis not present

## 2023-02-01 DIAGNOSIS — K8012 Calculus of gallbladder with acute and chronic cholecystitis without obstruction: Secondary | ICD-10-CM | POA: Insufficient documentation

## 2023-02-01 HISTORY — DX: Long term (current) use of antithrombotics/antiplatelets: Z79.02

## 2023-02-01 HISTORY — DX: Long term (current) use of aspirin: Z79.82

## 2023-02-01 HISTORY — DX: Bradycardia, unspecified: R00.1

## 2023-02-01 HISTORY — DX: Palpitations: R00.2

## 2023-02-01 HISTORY — DX: Other specified diseases of liver: K76.89

## 2023-02-01 HISTORY — DX: Umbilical hernia with obstruction, without gangrene: K42.0

## 2023-02-01 HISTORY — PX: UMBILICAL HERNIA REPAIR: SHX196

## 2023-02-01 HISTORY — DX: Atherosclerosis of aorta: I70.0

## 2023-02-01 HISTORY — DX: Benign prostatic hyperplasia without lower urinary tract symptoms: N40.0

## 2023-02-01 HISTORY — DX: Unspecified systolic (congestive) heart failure: I50.20

## 2023-02-01 HISTORY — DX: Complete loss of teeth, unspecified cause, unspecified class: K08.109

## 2023-02-01 SURGERY — CHOLECYSTECTOMY, ROBOT-ASSISTED, LAPAROSCOPIC
Anesthesia: General | Site: Abdomen

## 2023-02-01 MED ORDER — DEXAMETHASONE SODIUM PHOSPHATE 10 MG/ML IJ SOLN
INTRAMUSCULAR | Status: DC | PRN
  Administered 2023-02-01: 10 mg via INTRAVENOUS

## 2023-02-01 MED ORDER — PHENYLEPHRINE HCL-NACL 20-0.9 MG/250ML-% IV SOLN
INTRAVENOUS | Status: DC | PRN
Start: 2023-02-01 — End: 2023-02-01
  Administered 2023-02-01: 20 ug/min via INTRAVENOUS

## 2023-02-01 MED ORDER — SUGAMMADEX SODIUM 200 MG/2ML IV SOLN
INTRAVENOUS | Status: DC | PRN
  Administered 2023-02-01: 200 mg via INTRAVENOUS

## 2023-02-01 MED ORDER — ACETAMINOPHEN 10 MG/ML IV SOLN: 1000.0000 mg | Freq: Once | INTRAVENOUS | Status: DC | PRN

## 2023-02-01 MED ORDER — LACTATED RINGERS IV SOLN: INTRAVENOUS | Status: DC

## 2023-02-01 MED ORDER — LIDOCAINE HCL (CARDIAC) PF 100 MG/5ML IV SOSY
PREFILLED_SYRINGE | INTRAVENOUS | Status: DC | PRN
  Administered 2023-02-01: 100 mg via INTRAVENOUS

## 2023-02-01 MED ORDER — CEFAZOLIN SODIUM-DEXTROSE 2-4 GM/100ML-% IV SOLN: 2.0000 g | INTRAVENOUS | Status: DC

## 2023-02-01 MED ORDER — BUPIVACAINE-EPINEPHRINE (PF) 0.25% -1:200000 IJ SOLN
INTRAMUSCULAR | Status: AC
  Filled 2023-02-01: qty 30

## 2023-02-01 MED ORDER — BUPIVACAINE LIPOSOME 1.3 % IJ SUSP
INTRAMUSCULAR | Status: DC | PRN
Start: 2023-02-01 — End: 2023-02-01
  Administered 2023-02-01: 10 mL

## 2023-02-01 MED ORDER — CHLORHEXIDINE GLUCONATE 0.12 % MT SOLN
15.0000 mL | Freq: Once | OROMUCOSAL | Status: AC
  Administered 2023-02-01: 15 mL via OROMUCOSAL

## 2023-02-01 MED ORDER — FENTANYL CITRATE (PF) 100 MCG/2ML IJ SOLN: 25.0000 ug | INTRAMUSCULAR | Status: DC | PRN

## 2023-02-01 MED ORDER — PROPOFOL 10 MG/ML IV BOLUS
INTRAVENOUS | Status: AC
  Filled 2023-02-01: qty 20

## 2023-02-01 MED ORDER — LACTATED RINGERS IV SOLN: 10.0000 mL/h | INTRAVENOUS | Status: DC

## 2023-02-01 MED ORDER — CHLORHEXIDINE GLUCONATE 0.12 % MT SOLN
OROMUCOSAL | Status: AC
  Filled 2023-02-01: qty 15

## 2023-02-01 MED ORDER — ACETAMINOPHEN 500 MG PO TABS
1000.0000 mg | ORAL_TABLET | ORAL | Status: AC
  Administered 2023-02-01: 1000 mg via ORAL

## 2023-02-01 MED ORDER — EPHEDRINE SULFATE-NACL 50-0.9 MG/10ML-% IV SOSY
PREFILLED_SYRINGE | INTRAVENOUS | Status: DC | PRN
  Administered 2023-02-01: 5 mg via INTRAVENOUS
  Administered 2023-02-01 (×2): 10 mg via INTRAVENOUS

## 2023-02-01 MED ORDER — ONDANSETRON HCL 4 MG/2ML IJ SOLN: 4.0000 mg | Freq: Once | INTRAMUSCULAR | Status: DC | PRN

## 2023-02-01 MED ORDER — INDOCYANINE GREEN 25 MG IV SOLR
INTRAVENOUS | Status: AC
  Filled 2023-02-01: qty 10

## 2023-02-01 MED ORDER — FENTANYL CITRATE (PF) 100 MCG/2ML IJ SOLN
INTRAMUSCULAR | Status: AC
  Filled 2023-02-01: qty 2

## 2023-02-01 MED ORDER — CHLORHEXIDINE GLUCONATE CLOTH 2 % EX PADS
6.0000 | MEDICATED_PAD | Freq: Once | CUTANEOUS | Status: AC
  Administered 2023-02-01: 6 via TOPICAL

## 2023-02-01 MED ORDER — 0.9 % SODIUM CHLORIDE (POUR BTL) OPTIME
TOPICAL | Status: DC | PRN
  Administered 2023-02-01: 500 mL

## 2023-02-01 MED ORDER — MIDAZOLAM HCL 2 MG/2ML IJ SOLN
INTRAMUSCULAR | Status: AC
  Filled 2023-02-01: qty 2

## 2023-02-01 MED ORDER — BUPIVACAINE-EPINEPHRINE (PF) 0.25% -1:200000 IJ SOLN
INTRAMUSCULAR | Status: DC | PRN
  Administered 2023-02-01: 30 mL

## 2023-02-01 MED ORDER — PROPOFOL 10 MG/ML IV BOLUS
INTRAVENOUS | Status: DC | PRN
  Administered 2023-02-01: 100 mg via INTRAVENOUS

## 2023-02-01 MED ORDER — BUPIVACAINE LIPOSOME 1.3 % IJ SUSP: 20.0000 mL | Freq: Once | INTRAMUSCULAR | Status: DC

## 2023-02-01 MED ORDER — OXYCODONE HCL 5 MG PO TABS: 5.0000 mg | ORAL_TABLET | ORAL | 0 refills | Status: DC | PRN

## 2023-02-01 MED ORDER — GABAPENTIN 300 MG PO CAPS
ORAL_CAPSULE | ORAL | Status: AC
  Filled 2023-02-01: qty 1

## 2023-02-01 MED ORDER — FENTANYL CITRATE (PF) 100 MCG/2ML IJ SOLN
INTRAMUSCULAR | Status: DC | PRN
  Administered 2023-02-01 (×2): 50 ug via INTRAVENOUS

## 2023-02-01 MED ORDER — OXYCODONE HCL 5 MG PO TABS
5.0000 mg | ORAL_TABLET | Freq: Once | ORAL | Status: AC | PRN
  Administered 2023-02-01: 5 mg via ORAL

## 2023-02-01 MED ORDER — GABAPENTIN 300 MG PO CAPS
300.0000 mg | ORAL_CAPSULE | ORAL | Status: AC
  Administered 2023-02-01: 300 mg via ORAL

## 2023-02-01 MED ORDER — ALBUTEROL SULFATE HFA 108 (90 BASE) MCG/ACT IN AERS
INHALATION_SPRAY | RESPIRATORY_TRACT | Status: DC | PRN
Start: 2023-02-01 — End: 2023-02-01
  Administered 2023-02-01 (×2): 2 via RESPIRATORY_TRACT

## 2023-02-01 MED ORDER — ORAL CARE MOUTH RINSE: 15.0000 mL | Freq: Once | OROMUCOSAL | Status: AC

## 2023-02-01 MED ORDER — ROCURONIUM BROMIDE 100 MG/10ML IV SOLN
INTRAVENOUS | Status: DC | PRN
  Administered 2023-02-01: 50 mg via INTRAVENOUS

## 2023-02-01 MED ORDER — DEXMEDETOMIDINE HCL IN NACL 80 MCG/20ML IV SOLN
INTRAVENOUS | Status: DC | PRN
Start: 2023-02-01 — End: 2023-02-01
  Administered 2023-02-01: 4 ug via INTRAVENOUS

## 2023-02-01 MED ORDER — ACETAMINOPHEN 500 MG PO TABS: 1000.0000 mg | ORAL_TABLET | Freq: Three times a day (TID) | ORAL | Status: DC | PRN

## 2023-02-01 MED ORDER — OXYCODONE HCL 5 MG PO TABS
ORAL_TABLET | ORAL | Status: AC
  Filled 2023-02-01: qty 1

## 2023-02-01 MED ORDER — MIDAZOLAM HCL 2 MG/2ML IJ SOLN
INTRAMUSCULAR | Status: DC | PRN
  Administered 2023-02-01: 2 mg via INTRAVENOUS

## 2023-02-01 MED ORDER — ACETAMINOPHEN 500 MG PO TABS
ORAL_TABLET | ORAL | Status: AC
  Filled 2023-02-01: qty 2

## 2023-02-01 MED ORDER — CEFAZOLIN SODIUM-DEXTROSE 2-4 GM/100ML-% IV SOLN
INTRAVENOUS | Status: AC
  Filled 2023-02-01: qty 100

## 2023-02-01 MED ORDER — OXYCODONE HCL 5 MG/5ML PO SOLN: 5.0000 mg | Freq: Once | ORAL | Status: AC | PRN

## 2023-02-01 MED ORDER — INDOCYANINE GREEN 25 MG IV SOLR
2.5000 mg | INTRAVENOUS | Status: AC
  Administered 2023-02-01: 2.5 mg via INTRAVENOUS

## 2023-02-01 SURGICAL SUPPLY — 59 items
ADH SKN CLS APL DERMABOND .7 (GAUZE/BANDAGES/DRESSINGS) ×2
APL PRP STRL LF DISP 70% ISPRP (MISCELLANEOUS) ×2
BAG PRESSURE INF REUSE 1000 (BAG) IMPLANT
BLADE SURG 15 STRL LF DISP TIS (BLADE) ×2 IMPLANT
BLADE SURG 15 STRL SS (BLADE) ×2
CANNULA CAP OBTURATR AIRSEAL 8 (CAP) IMPLANT
CAUTERY HOOK MNPLR 1.6 DVNC XI (INSTRUMENTS) ×2 IMPLANT
CHLORAPREP W/TINT 26 (MISCELLANEOUS) ×2 IMPLANT
CLIP LIGATING HEMO O LOK GREEN (MISCELLANEOUS) ×2 IMPLANT
DERMABOND ADVANCED .7 DNX12 (GAUZE/BANDAGES/DRESSINGS) ×2 IMPLANT
DRAPE ARM DVNC X/XI (DISPOSABLE) ×8 IMPLANT
DRAPE COLUMN DVNC XI (DISPOSABLE) ×2 IMPLANT
DRAPE LAPAROTOMY 77X122 PED (DRAPES) ×2 IMPLANT
ELECT CAUTERY BLADE TIP 2.5 (TIP) ×2
ELECT REM PT RETURN 9FT ADLT (ELECTROSURGICAL) ×2
ELECTRODE CAUTERY BLDE TIP 2.5 (TIP) ×2 IMPLANT
ELECTRODE REM PT RTRN 9FT ADLT (ELECTROSURGICAL) ×2 IMPLANT
FORCEPS BPLR R/ABLATION 8 DVNC (INSTRUMENTS) ×2 IMPLANT
FORCEPS PROGRASP DVNC XI (FORCEP) ×2 IMPLANT
GAUZE 4X4 16PLY ~~LOC~~+RFID DBL (SPONGE) ×2 IMPLANT
GLOVE SURG SYN 7.0 (GLOVE) ×4 IMPLANT
GLOVE SURG SYN 7.0 PF PI (GLOVE) ×4 IMPLANT
GLOVE SURG SYN 7.5 E (GLOVE) ×4 IMPLANT
GLOVE SURG SYN 7.5 PF PI (GLOVE) ×4 IMPLANT
GOWN STRL REUS W/ TWL LRG LVL3 (GOWN DISPOSABLE) ×8 IMPLANT
GOWN STRL REUS W/TWL LRG LVL3 (GOWN DISPOSABLE) ×6
IRRIGATOR SUCT 8 DISP DVNC XI (IRRIGATION / IRRIGATOR) IMPLANT
IV NS 1000ML (IV SOLUTION)
IV NS 1000ML BAXH (IV SOLUTION) IMPLANT
KIT PINK PAD W/HEAD ARE REST (MISCELLANEOUS) ×2
KIT PINK PAD W/HEAD ARM REST (MISCELLANEOUS) ×2 IMPLANT
LABEL OR SOLS (LABEL) ×2 IMPLANT
MANIFOLD NEPTUNE II (INSTRUMENTS) ×2 IMPLANT
NDL HYPO 22X1.5 SAFETY MO (MISCELLANEOUS) ×2 IMPLANT
NEEDLE HYPO 22X1.5 SAFETY MO (MISCELLANEOUS) ×2 IMPLANT
NS IRRIG 500ML POUR BTL (IV SOLUTION) ×2 IMPLANT
OBTURATOR OPTICAL STND 8 DVNC (TROCAR) ×2
OBTURATOR OPTICALSTD 8 DVNC (TROCAR) ×2 IMPLANT
PACK BASIN MINOR ARMC (MISCELLANEOUS) ×2 IMPLANT
PACK LAP CHOLECYSTECTOMY (MISCELLANEOUS) ×2 IMPLANT
PENCIL SMOKE EVACUATOR (MISCELLANEOUS) ×2 IMPLANT
SEAL UNIV 5-12 XI (MISCELLANEOUS) ×8 IMPLANT
SET TUBE FILTERED XL AIRSEAL (SET/KITS/TRAYS/PACK) IMPLANT
SET TUBE SMOKE EVAC HIGH FLOW (TUBING) ×2 IMPLANT
SOL ELECTROSURG ANTI STICK (MISCELLANEOUS) ×2
SOLUTION ELECTROSURG ANTI STCK (MISCELLANEOUS) ×2 IMPLANT
SPIKE FLUID TRANSFER (MISCELLANEOUS) ×2 IMPLANT
SUT ETHIBOND 0 MO6 C/R (SUTURE) ×2 IMPLANT
SUT MNCRL AB 4-0 PS2 18 (SUTURE) ×2 IMPLANT
SUT VIC AB 2-0 SH 27 (SUTURE) ×2
SUT VIC AB 2-0 SH 27XBRD (SUTURE) ×2 IMPLANT
SUT VIC AB 3-0 SH 27 (SUTURE) ×2
SUT VIC AB 3-0 SH 27X BRD (SUTURE) ×2 IMPLANT
SUT VICRYL 0 UR6 27IN ABS (SUTURE) ×4 IMPLANT
SYR 20ML LL LF (SYRINGE) ×2 IMPLANT
SYS BAG RETRIEVAL 10MM (BASKET) ×2
SYSTEM BAG RETRIEVAL 10MM (BASKET) ×2 IMPLANT
TRAP FLUID SMOKE EVACUATOR (MISCELLANEOUS) ×2 IMPLANT
WATER STERILE IRR 500ML POUR (IV SOLUTION) ×2 IMPLANT

## 2023-02-01 NOTE — Discharge Instructions (Addendum)
AMBULATORY SURGERY  DISCHARGE INSTRUCTIONS   The drugs that you were given will stay in your system until tomorrow so for the next 24 hours you should not:  Drive an automobile Make any legal decisions Drink any alcoholic beverage   You may resume regular meals tomorrow.  Today it is better to start with liquids and gradually work up to solid foods.  You may eat anything you prefer, but it is better to start with liquids, then soup and crackers, and gradually work up to solid foods.   Please notify your doctor immediately if you have any unusual bleeding, trouble breathing, redness and pain at the surgery site, drainage, fever, or pain not relieved by medication.    Additional Instructions:  Please contact your physician with any problems or Same Day Surgery at [phone], Monday through Friday 6 am to 4 pm, or [place] at [facility] number at [phone].               Discharge Instructions: 1.  Patient may shower, but do not scrub wounds heavily and dab dry only. 2.  Do not submerge wounds in pool/tub until fully healed. 3.  Do not apply ointments or hydrogen peroxide to the wounds. 4.  May apply ice packs to the wounds for comfort. 5.  May resume your Plavix on 02/03/23. 6.  Do not drive while taking narcotics for pain control.  Prior to driving, make sure you are able to rotate right and left to look at blindspots without significant pain or discomfort. 7.  No heavy lifting or pushing of more than 10-15 lbs for 4 weeks.

## 2023-02-01 NOTE — Op Note (Signed)
  Procedure Date:  02/01/2023  Pre-operative Diagnosis:  Acute cholecystitis, incarcerated umbilical hernia  Post-operative Diagnosis: Acute cholecystitis, incarcerated umbilical hernia 1 cm.  Procedure:  Robotic assisted cholecystectomy with ICG FireFly cholangiogram; open umbilical hernia repair  Surgeon:  Howie Ill, MD  Anesthesia:  General endotracheal  Estimated Blood Loss:  10 ml  Specimens:  gallbladder  Complications:  None  Indications for Procedure:  This is a 67 y.o. male with a recent history of acute cholecystitis, now presenting for cholecystectomy.  He also has a small incarcerated umbilical hernia.  The benefits, complications, treatment options, and expected outcomes were discussed with the patient. The risks of bleeding, infection, recurrence of symptoms, failure to resolve symptoms, bile duct damage, bile duct leak, retained common bile duct stone, bowel injury, and need for further procedures were all discussed with the patient and he was willing to proceed.  Description of Procedure: The patient was correctly identified in the preoperative area and brought into the operating room.  The patient was placed supine with VTE prophylaxis in place.  Appropriate time-outs were performed.  Anesthesia was induced and the patient was intubated.  Appropriate antibiotics were infused.  The abdomen was prepped and draped in a sterile fashion. An infraumbilical incision was made. Cautery was used to dissect along the umbilical stalk and to separate the stalk from the underlying fascia, revealing a 1 cm hernia defect.  The fascial edges were cleared, and a 12 mm robotic port was inserted.  Pneumoperitoneum was obtained with appropriate opening pressures.  Three 8-mm ports were placed in the mid abdomen at the level of the umbilicus under direct visualization.  The DaVinci platform was docked, camera targeted, and instruments were placed under direct visualization.  The  gallbladder was identified.  The fundus was grasped and retracted cephalad.  Adhesions were lysed bluntly and with electrocautery. The infundibulum was grasped and retracted laterally, exposing the peritoneum overlying the gallbladder.  This was incised with electrocautery and extended on either side of the gallbladder.  FireFly cholangiogram was then obtained, and we were able to clearly identify the cystic duct and common bile duct.  The cystic duct and cystic artery were carefully dissected with combination of cautery and blunt dissection.  Both were clipped twice proximally and once distally, cutting in between.  The gallbladder was taken from the gallbladder fossa in a retrograde fashion with electrocautery. The gallbladder was placed in an Endocatch bag. The liver bed was inspected and any bleeding was controlled with electrocautery. The right upper quadrant was then inspected again revealing intact clips, no bleeding, and no ductal injury.   The 8 mm ports were removed under direct visualization and the 12 mm port was removed.  The Endocatch bag was brought out via the umbilical incision. The hernia defect was closed using 0 vicryl sutures.  The umbilical stalk was reattached using 2-0 Vicryl.  Local anesthetic was infused in all incisions and fascia.  The umbilical incision was closed using 3-0 Vicryl and 4-0 Monocryl, and the other port incisions were closed with 4-0 Monocryl.  The wounds were cleaned and sealed with DermaBond.  The patient was emerged from anesthesia and extubated and brought to the recovery room for further management.  The patient tolerated the procedure well and all counts were correct at the end of the case.   Howie Ill, MD

## 2023-02-01 NOTE — Transfer of Care (Signed)
Immediate Anesthesia Transfer of Care Note  Patient: CHANC KERVIN  Procedure(s) Performed: XI ROBOTIC ASSISTED LAPAROSCOPIC CHOLECYSTECTOMY (Abdomen) INDOCYANINE GREEN FLUORESCENCE IMAGING (ICG) HERNIA REPAIR UMBILICAL ADULT, open (Abdomen)  Patient Location: PACU  Anesthesia Type:General  Level of Consciousness: awake  Airway & Oxygen Therapy: Patient Spontanous Breathing  Post-op Assessment: Report given to RN and Post -op Vital signs reviewed and stable  Post vital signs: Reviewed and stable  Last Vitals:  Vitals Value Taken Time  BP 144/78 02/01/23 0915  Temp 36.2 C 02/01/23 0915  Pulse 72 02/01/23 0922  Resp 17 02/01/23 0922  SpO2 95 % 02/01/23 0922  Vitals shown include unfiled device data.  Last Pain:  Vitals:   02/01/23 0616  TempSrc: Temporal  PainSc: 0-No pain      Patients Stated Pain Goal: 0 (02/01/23 1610)  Complications: No notable events documented.

## 2023-02-01 NOTE — H&P (Signed)
History of Present Illness: Darryl Gilbert is a 67 y.o. male presents today for follow-up of right upper quadrant and epigastric pain.  The patient was admitted on 12/02/2022 with concerns for possible acute cholecystitis.  Initially on ultrasound there were no gallstones noted but gallbladder was distended.  CT scan was then done which did show some stranding around his gallbladder.  His LFTs are elevated and MRCP was done in the emergency room which showed sludge with a possible distal CBD stone but this was only seen on one of the series.  There was gallbladder wall thickening but it was not certain whether this was due to underdistention versus inflammation.  The patient was treated conservatively with antibiotics for possible cholecystitis.  He had a repeat ultrasound on 12/06/2022 due to some persistent symptoms the day prior but this again showed no gallstones and no wall thickening.  The day of the studies, his pain had fully resolved at that point.  HIDA scan was also done which also did not show acute cholecystitis and showed a patent cystic duct with a normal ejection fraction.   Since discharge, the patient reports that he has been having intermittent epigastric discomfort which has not been as severe as the day that he came to the hospital.  Denies any nausea or vomiting.  Discomfort is more seen in the morning but denies any troubles with p.o. intake.   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     Hyperlipidemia LDL goal <70     Hypertension     Ischemic cardiomyopathy     Smoking            Past Surgical History:      Past Surgical History:  Procedure Laterality Date   COLONOSCOPY WITH PROPOFOL N/A 05/07/2022    Procedure: COLONOSCOPY WITH PROPOFOL;   Surgeon: Wyline Mood, MD;  Location: [facility] ENDOSCOPY;  Service: Gastroenterology;  Laterality: N/A;   CORONARY/GRAFT ACUTE MI REVASCULARIZATION N/A 12/20/2020    Procedure: Coronary/Graft Acute MI Revascularization;  Surgeon: [name], MD;  Location: [facility] INVASIVE CV LAB;  Service: Cardiovascular;  Laterality: N/A;   FRACTURE SURGERY        Left leg fracture, rod placement   LEFT HEART CATH AND CORONARY ANGIOGRAPHY N/A 12/20/2020    Procedure: LEFT HEART CATH AND CORONARY ANGIOGRAPHY;  Surgeon: [name], MD;  Location: [facility] INVASIVE CV LAB;  Service: Cardiovascular;  Laterality: N/A;   LEFT HEART CATH AND CORONARY ANGIOGRAPHY N/A 02/23/2021    Procedure: LEFT HEART CATH AND CORONARY ANGIOGRAPHY;  Surgeon: [name], MD;  Location: [facility] INVASIVE CV LAB;  Service: Cardiovascular;  Laterality: N/A;          Home Medications:        Prior to Admission medications   Medication Sig Start Date End Date Taking? Authorizing Provider  aspirin 81 MG chewable tablet Chew 1 tablet (81 mg total) by mouth daily. 12/26/20   Yes Glade Lloyd, MD  atorvastatin (LIPITOR) 80 MG tablet Take 1 tablet (80 mg total) by mouth daily. 01/20/22   Yes Lanier Prude, MD  buPROPion (WELLBUTRIN XL) 300 MG 24 hr tablet Take 300 mg by mouth daily. 01/20/22   Yes [provider]  clopidogrel (PLAVIX) 75 MG tablet Take 1 tablet (75 mg total) by mouth daily. 07/09/22   Yes [name], MD  esomeprazole (NEXIUM) 20 MG capsule Take 1 capsule (20 mg total) by mouth daily at 12 noon. 12/31/20   Yes Dunn, Raymon Mutton, PA-C  furosemide (LASIX) 20 MG tablet Take 1 tablet (20 mg total) by mouth daily. 10/07/22   Yes [name], MD  HYDROcodone-acetaminophen (NORCO) 10-325 MG tablet Take 1 tablet by mouth 3 (three) times daily as needed. 12/25/21   Yes [provider]  metoprolol succinate (TOPROL XL) 25 MG 24 hr tablet Take 0.5 tablets (12.5 mg total) by mouth at bedtime. Patient taking  differently: Take 12.5 mg by mouth in the morning. 11/09/22   Yes Riddle, Luella Cook, NP  nitroGLYCERIN (NITROSTAT) 0.4 MG SL tablet Place 1 tablet (0.4 mg total) under the tongue every 5 (five) minutes as needed for chest pain. 04/08/22   Yes Dunn, Ryan M, PA-C  sacubitril-valsartan (ENTRESTO) 24-26 MG Take 1 tablet by mouth 2 (two) times daily.     Yes [provider]  spironolactone (ALDACTONE) 25 MG tablet Take 0.5 tablets (12.5 mg total) by mouth daily. 12/17/22 03/17/23 Yes [name], MD  tamsulosin (FLOMAX) 0.4 MG CAPS capsule Take 0.4 mg by mouth daily.     Yes [provider]  umeclidinium-vilanterol (ANORO ELLIPTA) 62.5-25 MCG/ACT AEPB Inhale 1 puff into the lungs daily. 10/15/22 10/15/23 Yes Raechel Chute, MD      Allergies: Allergies       Allergies  Allergen Reactions   Amiodarone Other (See Comments)      QT prolongation in the context of inferior STEMI and IV amiodarone use. Avoid all QT prolonging medications.         Review of Systems: Review of Systems  Constitutional:  Negative for chills and fever.  HENT:  Negative for hearing loss.   Respiratory:  Negative for shortness of breath.   Cardiovascular:  Negative for chest pain.  Gastrointestinal:  Positive for abdominal pain. Negative for nausea and vomiting.  Genitourinary:  Negative for dysuria.  Musculoskeletal:  Negative for myalgias.  Skin:  Negative for rash.  Neurological:  Negative for dizziness.  Psychiatric/Behavioral:  Negative for depression.       Physical Exam BP 125/66   Pulse (!) 58   Temp 98 F (36.7 C)   Ht 6' (1.829 m)   Wt 199 lb (90.3 kg)   SpO2 97%   BMI 26.99 kg/m  CONSTITUTIONAL: No acute distress, well-nourished HEENT:  Normocephalic, atraumatic, extraocular motion intact. NECK: Trachea is midline, no jugular venous distention. RESPIRATORY:  Lungs are clear, and breath sounds are equal bilaterally. Normal respiratory effort without pathologic use of accessory  muscles. CARDIOVASCULAR: Heart is regular without murmurs, gallops, or rubs. GI: The abdomen is soft, nondistended, with mild discomfort to palpation in the epigastric/upper quadrant area.  Negative Murphy's sign.  Patient also has a small incarcerated umbilical hernia with mild discomfort to deep palpation.  MUSCULOSKELETAL: Normal gait, no peripheral edema. NEUROLOGIC:  Motor and sensation is grossly normal.  Cranial nerves are grossly intact. PSYCH:  Alert and oriented to person, place and time. Affect is normal.   Labs/Imaging: Ultrasound RUQ on 12/02/2022: IMPRESSION: Distended gallbladder.  No stones.  No ductal dilatation. Minimally complex left hepatic lobe cyst measuring 11 mm   CT abdomen/pelvis on 12/02/2022: IMPRESSION: 1. Haziness around the gallbladder, without calcified stone or biliary dilatation. Acute cholecystitis not confirmed  by previously performed ultrasound. Correlation with nuclear medicine hepatobiliary imaging could be obtained if indicated. 2. Diverticular disease of the left colon without acute bowel wall thickening. 3. Diffuse aneurysmal dilatation of the infrarenal abdominal aorta up to 4.1 cm. Recommend follow-up every 12 months and vascular consultation. This recommendation follows ACR consensus guidelines:  White Paper of the ACR Incidental Findings Committee II on Vascular Findings. J Am Coll Radiol 2013; 82:956-213. 4. Aortic atherosclerosis.  MRCP on 12/02/2022: IMPRESSION: 1. 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 The gallbladder is contracted, leading to wall thickening making it difficult to assess for inflammation. There is a suggestion of some gallbladder wall thickening/edema but much of this could be secondary to contraction. There is sludge in the gallbladder without visualized gallstones. 2. No biliary dilatation. Question of a distal CBD stone on a single image, but this is not confirmed on any other  imaging sequences and given the degree of artifact caused by motion, this is more likely to be artifactual. 3. Infrarenal abdominal aortic aneurysm 4.0 cm in diameter. Recommend follow-up every 12 months and vascular consultation. This recommendation follows ACR consensus guidelines: White Paper of the ACR Incidental Findings Committee II on Vascular Findings. J Am Coll Radiol 2013; 08:657-846. 4. Dextroconvex lumbar scoliosis with lower thoracic and lumbar spondylosis and degenerative disc disease. 5. Dependent subsegmental atelectasis in the lower lobes.   Ultrasound RUQ on 12/06/2022: IMPRESSION: No acute abnormality seen in the right upper quadrant the abdomen.   HIDA scan on 12/06/2022: IMPRESSION: 1. Patent cystic duct and common bile duct without findings of cholecystitis. 2. Normal gallbladder ejection fraction without findings of gallbladder dysfunction on today's exam.     Assessment and Plan: This is a 67 y.o. male with likely acute cholecystitis.   - Discussed with patient again the findings on his initial imaging studies that were suspicious for possible cholecystitis and was treated as such conservatively.  His subsequent imaging studies were normal with a normal ultrasound and normal HIDA scan but at that point, his symptoms had fully resolved.  The patient does have a history of GERD issues but these have been under control with PPI and the symptoms that he is currently having are not the same as he had in the past with his GERD.  I do think that given the findings and symptoms, he most likely had an episode of acute cholecystitis which responded to conservative management.  Discussed with him that as such, I would recommend proceeding with cholecystectomy in the future to prevent any further episodes of this.  He is in agreement. - The patient does have a cardiac history and is currently on both aspirin and Plavix.  We will want to obtain clearance to hopefully stop both  medications prior to surgery. - Discussed with the patient the plan for a robotic assisted cholecystectomy as well as an open umbilical hernia repair.  Reviewed the surgery at length with him including the planned incisions, the risks of bleeding, infection, injury to surrounding structures, the use of ICG to better evaluate the biliary anatomy, ability to repair the umbilical hernia at the same time of surgery, that this would be an outpatient procedure, postoperative activity restrictions, pain control, and he is willing to proceed. - We will tentatively schedule the patient for surgery on 02/01/2023 pending cardiology clearance.  All of his questions have been answered.     Howie Ill, MD [facility]

## 2023-02-01 NOTE — Anesthesia Preprocedure Evaluation (Addendum)
Anesthesia Evaluation  Patient identified by MRN, date of birth, ID band Patient awake    Reviewed: Allergy & Precautions, NPO status , Patient's Chart, lab work & pertinent test results  History of Anesthesia Complications Negative for: history of anesthetic complications  Airway Mallampati: IV   Neck ROM: Full    Dental  (+) Edentulous Upper, Edentulous Lower   Pulmonary former smoker   Pulmonary exam normal breath sounds clear to auscultation       Cardiovascular hypertension, + CAD (s/p MI and PCI on Plavix), + Peripheral Vascular Disease (AAA 4.1 cm) and +CHF (ischemic cardiomyopathy, EF 45-50%)  Normal cardiovascular exam Rhythm:Regular Rate:Normal  ECG 12/17/22: Sinus bradycardia When compared with ECG of 02-Dec-2022 11:25, No significant change was found  Echo 04/27/22:   1. Left ventricular ejection fraction, by estimation, is 35 to 40%. The left ventricle has moderately decreased function. The left ventricle demonstrates global hypokinesis with severe hypokinesis of the inferior wall. The left ventricular internal cavity size was mildly dilated. Left ventricular diastolic parameters are consistent with Grade II diastolic dysfunction (pseudonormalization).   2. Right ventricular systolic function is normal. The right ventricular size is normal.   3. The mitral valve is normal in structure. Mild to moderate mitral valve regurgitation. No evidence of mitral stenosis.   4. The aortic valve has an indeterminant number of cusps. Aortic valve regurgitation is not visualized. No aortic stenosis is present.   5. The inferior vena cava is normal in size with greater than 50% respiratory variability, suggesting right atrial pressure of 3 mmHg.  Comparison(s): 06/25/21-EF 45%.   Myocardial perfusion 05/06/22:  Pharmacological myocardial perfusion imaging study with no significant  ischemia Moderate-sized fixed perfusion defect in the basal  to mid lateral wall consistent with prior MI Hypokinesis of the basal to mid lateral wall, EF estimated at 48%, GI uptake artifact noted No EKG changes concerning for ischemia at peak stress or in recovery. CT attenuation correction images with coronary disease and stenting in the left circumflex, mild coronary calcification in the LAD, no significant aortic atherosclerosis Low risk scan   Neuro/Psych negative neurological ROS     GI/Hepatic ,GERD  ,,  Endo/Other  negative endocrine ROS    Renal/GU negative Renal ROS     Musculoskeletal   Abdominal   Peds  Hematology negative hematology ROS (+)   Anesthesia Other Findings Cardiology note 12/17/22:  ASSESSMENT AND PLAN:   1.  Coronary artery disease involving native coronary arteries without angina: No recurrent chest pain and overall he is doing reasonably well from a cardiac standpoint.   He does have ectasia in the right coronary artery and thus I elected to keep him on long-term dual antiplatelet therapy.     2 . chronic systolic heart failure due to ischemic cardiomyopathy: No evidence of volume overload.  Continue small dose Toprol which cannot be increased due to underlying bradycardia.  Continue Entresto.  I added spironolactone 12.5 mg once daily and discontinued potassium chloride.  Check basic metabolic profile in 1 week.  Will consider adding an SGLT2 inhibitor in the near future.   3.  Hyperlipidemia: Continue high-dose atorvastatin.  Most recent lipid profile showed an LDL of 58.   4.  Abdominal aortic aneurysm: Measuring 4.1 cm on recent CT scan of the abdomen.  Will plan on ultrasound follow-up in our office in February to ensure stability in size.  If it remains stable, surveillance will be on an annual basis.  I instructed him not  to carry more than 30 pounds.   5.  Frequent PACs and occasional PVCs: Symptoms are controlled with small dose Toprol.   Disposition:   FU with me in 4 months   Reproductive/Obstetrics                             Anesthesia Physical Anesthesia Plan  ASA: 3  Anesthesia Plan: General   Post-op Pain Management:    Induction: Intravenous  PONV Risk Score and Plan: 2 and Ondansetron, Dexamethasone and Treatment may vary due to age or medical condition  Airway Management Planned: Oral ETT  Additional Equipment:   Intra-op Plan:   Post-operative Plan: Extubation in OR  Informed Consent: I have reviewed the patients History and Physical, chart, labs and discussed the procedure including the risks, benefits and alternatives for the proposed anesthesia with the patient or authorized representative who has indicated his/her understanding and acceptance.     Dental advisory given  Plan Discussed with: CRNA  Anesthesia Plan Comments: (Patient consented for risks of anesthesia including but not limited to:  - adverse reactions to medications - damage to eyes, teeth, lips or other oral mucosa - nerve damage due to positioning  - sore throat or hoarseness - damage to heart, brain, nerves, lungs, other parts of body or loss of life  Informed patient about role of CRNA in peri- and intra-operative care.  Patient voiced understanding.)        Anesthesia Quick Evaluation

## 2023-02-01 NOTE — Anesthesia Postprocedure Evaluation (Signed)
Anesthesia Post Note  Patient: Darryl Gilbert  Procedure(s) Performed: XI ROBOTIC ASSISTED LAPAROSCOPIC CHOLECYSTECTOMY (Abdomen) INDOCYANINE GREEN FLUORESCENCE IMAGING (ICG) HERNIA REPAIR UMBILICAL ADULT, open (Abdomen)  Patient location during evaluation: PACU Anesthesia Type: General Level of consciousness: awake and alert, oriented and patient cooperative Pain management: pain level controlled Vital Signs Assessment: post-procedure vital signs reviewed and stable Respiratory status: spontaneous breathing, nonlabored ventilation and respiratory function stable Cardiovascular status: blood pressure returned to baseline and stable Postop Assessment: adequate PO intake Anesthetic complications: no   No notable events documented.   Last Vitals:  Vitals:   02/01/23 0945 02/01/23 0956  BP:  (!) 141/71  Pulse: 69 65  Resp: 15 16  Temp: (!) 36.4 C (!) 36.1 C  SpO2: 96% 97%    Last Pain:  Vitals:   02/01/23 0956  TempSrc: Tympanic  PainSc: 3                  Reed Breech

## 2023-02-01 NOTE — Anesthesia Procedure Notes (Signed)
Procedure Name: Intubation Date/Time: 02/01/2023 7:40 AM  Performed by: Cheral Bay, CRNAPre-anesthesia Checklist: Patient identified, Emergency Drugs available, Suction available and Patient being monitored Patient Re-evaluated:Patient Re-evaluated prior to induction Oxygen Delivery Method: Circle system utilized Preoxygenation: Pre-oxygenation with 100% oxygen Induction Type: IV induction Ventilation: Mask ventilation without difficulty Laryngoscope Size: McGraph and 4 Grade View: Grade I Tube type: Oral Tube size: 7.0 mm Number of attempts: 1 Airway Equipment and Method: Stylet Placement Confirmation: ETT inserted through vocal cords under direct vision, positive ETCO2 and breath sounds checked- equal and bilateral Secured at: 20 cm Tube secured with: Tape Dental Injury: Teeth and Oropharynx as per pre-operative assessment

## 2023-02-02 ENCOUNTER — Encounter: Payer: Self-pay | Admitting: Surgery

## 2023-02-02 LAB — SURGICAL PATHOLOGY

## 2023-02-04 ENCOUNTER — Ambulatory Visit: Payer: 59 | Admitting: Student in an Organized Health Care Education/Training Program

## 2023-02-11 ENCOUNTER — Encounter: Payer: Self-pay | Admitting: Student in an Organized Health Care Education/Training Program

## 2023-02-11 ENCOUNTER — Other Ambulatory Visit
Admission: RE | Admit: 2023-02-11 | Discharge: 2023-02-11 | Disposition: A | Discharge status: Home / Self Care | Payer: 59 | Source: Ambulatory Visit | Attending: Student in an Organized Health Care Education/Training Program | Admitting: Student in an Organized Health Care Education/Training Program

## 2023-02-11 ENCOUNTER — Ambulatory Visit (INDEPENDENT_AMBULATORY_CARE_PROVIDER_SITE_OTHER): Payer: 59 | Admitting: Student in an Organized Health Care Education/Training Program

## 2023-02-11 VITALS — BP 120/70 | HR 54 | Temp 97.8°F | Ht 72.0 in | Wt 198.4 lb

## 2023-02-11 DIAGNOSIS — Z23 Encounter for immunization: Secondary | ICD-10-CM

## 2023-02-11 DIAGNOSIS — J439 Emphysema, unspecified: Secondary | ICD-10-CM | POA: Insufficient documentation

## 2023-02-11 LAB — VITAMIN D 25 HYDROXY (VIT D DEFICIENCY, FRACTURES): Vit D, 25-Hydroxy: 34.39 ng/mL (ref 30–100)

## 2023-02-11 MED ORDER — ALBUTEROL SULFATE HFA 108 (90 BASE) MCG/ACT IN AERS
2.0000 | INHALATION_SPRAY | Freq: Four times a day (QID) | RESPIRATORY_TRACT | 2 refills | Status: DC | PRN
Start: 2023-02-11 — End: 2023-05-09

## 2023-02-11 NOTE — Progress Notes (Signed)
Assessment & Plan:   #Pulmonary emphysema(HCC) #COPD GOLD 2B   Presents for follow up of COPD, with PFT's showing obstruction (FEV1 of 2.71L, 73% predicted, z-score of -2.01). Decline in DLCO is consistent with the emphysema noted on his screening lung CT. His symptoms have overall been stable without any increase in cough, shortness of breath, or wheeze. He is not on home oxygen, and has not had any COPD exacerbations.   -Continue LABA/LAMA therapy with Anoro Ellipta -Prevnar-20 today, he received his flu vaccine - VITAMIN D 25 Hydroxy (Vit-D Deficiency, Fractures); Future - Alpha-1-antitrypsin; Future - albuterol (VENTOLIN HFA) 108 (90 Base) MCG/ACT inhaler; Inhale 2 puffs into the lungs every 6 (six) hours as needed for wheezing or shortness of breath.  Dispense: 8 g; Refill: 2   Return in about 1 year (around 02/11/2024).  I spent 32 minutes caring for this patient today, including preparing to see the patient, obtaining a medical history , reviewing a separately obtained history, performing a medically appropriate examination and/or evaluation, counseling and educating the patient/family/caregiver, ordering medications, tests, or procedures, and documenting clinical information in the electronic health record  Raechel Chute, MD  Pulmonary Critical Care   End of visit medications:  Meds ordered this encounter  Medications   albuterol (VENTOLIN HFA) 108 (90 Base) MCG/ACT inhaler    Sig: Inhale 2 puffs into the lungs every 6 (six) hours as needed for wheezing or shortness of breath.    Dispense:  8 g    Refill:  2     Current Outpatient Medications:    albuterol (VENTOLIN HFA) 108 (90 Base) MCG/ACT inhaler, Inhale 2 puffs into the lungs every 6 (six) hours as needed for wheezing or shortness of breath., Disp: 8 g, Rfl: 2   aspirin 81 MG chewable tablet, Chew 1 tablet (81 mg total) by mouth daily. (Patient taking differently: Chew 81 mg by mouth every morning.), Disp:  30 tablet, Rfl: 0   atorvastatin (LIPITOR) 80 MG tablet, TAKE ONE TABLET BY MOUTH ONE TIME DAILY (Patient taking differently: Take 80 mg by mouth every morning.), Disp: 90 tablet, Rfl: 3   buPROPion (WELLBUTRIN XL) 300 MG 24 hr tablet, Take 300 mg by mouth every morning., Disp: , Rfl:    clopidogrel (PLAVIX) 75 MG tablet, Take 1 tablet (75 mg total) by mouth daily. (Patient taking differently: Take 75 mg by mouth every morning.), Disp: 90 tablet, Rfl: 3   escitalopram (LEXAPRO) 5 MG tablet, Take 5 mg by mouth daily., Disp: , Rfl:    esomeprazole (NEXIUM) 20 MG capsule, Take 1 capsule (20 mg total) by mouth daily at 12 noon. (Patient taking differently: Take 20 mg by mouth every morning.), Disp: 30 capsule, Rfl: 6   furosemide (LASIX) 20 MG tablet, Take 1 tablet (20 mg total) by mouth daily. (Patient taking differently: Take 20 mg by mouth every morning.), Disp: 90 tablet, Rfl: 3   metoprolol succinate (TOPROL XL) 25 MG 24 hr tablet, Take 0.5 tablets (12.5 mg total) by mouth at bedtime. (Patient taking differently: Take 12.5 mg by mouth in the morning.), Disp: , Rfl:    nitroGLYCERIN (NITROSTAT) 0.4 MG SL tablet, Place 1 tablet (0.4 mg total) under the tongue every 5 (five) minutes as needed for chest pain., Disp: 25 tablet, Rfl: 3   sacubitril-valsartan (ENTRESTO) 24-26 MG, Take 1 tablet by mouth 2 (two) times daily., Disp: , Rfl:    spironolactone (ALDACTONE) 25 MG tablet, Take 0.5 tablets (12.5 mg total) by mouth daily. (  Patient taking differently: Take 12.5 mg by mouth every morning.), Disp: 45 tablet, Rfl: 1   tamsulosin (FLOMAX) 0.4 MG CAPS capsule, Take 0.4 mg by mouth daily after supper., Disp: , Rfl:    umeclidinium-vilanterol (ANORO ELLIPTA) 62.5-25 MCG/ACT AEPB, Inhale 1 puff into the lungs daily. (Patient taking differently: Inhale 1 puff into the lungs every morning.), Disp: 30 each, Rfl: 11   HYDROcodone-acetaminophen (NORCO) 10-325 MG tablet, Take 1 tablet by mouth in the morning, at noon,  in the evening, and at bedtime. (Patient not taking: Reported on 02/11/2023), Disp: , Rfl:    Subjective:   PATIENT ID: Darryl Gilbert GENDER: male DOB: May 08, 1955, MRN: [id number]  Chief Complaint  Patient presents with   Follow-up    SOB with exertion and at rest and occ dry cough.      HPI  Darryl Gilbert is a pleasant 67 year old male presenting for follow up of COPD.   He has felt well since his last visit. No new symptoms reported. No increase in shortness of breath or cough. Remains short of breath with exertion. He is compliant with his inhalers. Denies wheeze, chest pain, or chest tightness. He was recently admitted for cholecystectomy, which was uncomplicated. He's also enrolled in lung cancer screening.  Symptoms started around the time of his heart attack. He was previously in his usual state of health with no dyspnea and no other respiratory symptoms. Following his heart attack, he's been short of breath at rest and with exertion. He does not feel that the dyspnea is worse with exertion.    Darryl Gilbert suffered an inferior STEMI 12/2020 with thrombotic occlusion of his proximal left circumflex. He was also found to have a 70% stenotic RCA. LVEDP during the procedure was measured at 22 mmHg. A drug eluting stent was deployed to the left circumflex and he's been on DAPT since. Most recent TTE 04/2022 showed an EF of 35-40% with global hypokinesis and severe hypokinesis of the inferior wall and grade II diastolic dysfunction. RV function was normal. Patient's Prasugrel was switched to Clopidogrel in February of 2024. He is now maintained on bisoprolol, spironolactone, and sacubitril-valsartan. He had atrial fibrillation in the setting of his MI, but this has not recurred and he is not maintained on anti-coagulation.   Patient reports a long standing history of smoking, quit in 12/2020. He smoked between 1.5 and 2 packs a day for around 50 years. He has previously worked in [employer], as a Futures trader, Occupational hygienist, and currently works a Office manager. He has some exposure to asbestos while working with car breaks in the past.  Ancillary information including prior medications, full medical/surgical/family/social histories, and PFTs (when available) are listed below and have been reviewed.   Review of Systems  Constitutional:  Negative for chills and fever.  Respiratory:  Positive for shortness of breath. Negative for hemoptysis, sputum production and wheezing.   Cardiovascular:  Negative for chest pain.  Skin:  Negative for rash.     Objective:   Vitals:   02/11/23 0832  BP: 120/70  Pulse: (!) 54  Temp: 97.8 F (36.6 C)  TempSrc: Oral  SpO2: 99%  Weight: 198 lb 6.4 oz (90 kg)  Height: 6' (1.829 m)   99% on RA BMI Readings from Last 3 Encounters:  02/11/23 26.91 kg/m  02/01/23 27.00 kg/m  01/24/23 27.00 kg/m   Wt Readings from Last 3 Encounters:  02/11/23 198 lb 6.4 oz (90 kg)  02/01/23 199 lb  1.2 oz (90.3 kg)  01/24/23 199 lb 1.2 oz (90.3 kg)    Physical Exam Constitutional:      Appearance: Normal appearance. He is not ill-appearing.  HENT:     Head: Normocephalic.     Mouth/Throat:     Mouth: Mucous membranes are moist.  Cardiovascular:     Rate and Rhythm: Normal rate and regular rhythm.     Pulses: Normal pulses.     Heart sounds: Normal heart sounds.  Pulmonary:     Effort: Pulmonary effort is normal. No respiratory distress.     Breath sounds: Normal breath sounds. No wheezing, rhonchi or rales.  Abdominal:     Palpations: Abdomen is soft.  Neurological:     General: No focal deficit present.     Mental Status: He is alert and oriented to person, place, and time. Mental status is at baseline.       Ancillary Information    Past Medical History:  Diagnosis Date   Abdominal aortic aneurysm (AAA) (HCC)    Acute cholecystitis    Acute ST elevation myocardial infarction (STEMI) of inferior wall (HCC) 12/20/2020   a.) LHC/PCI  12/20/2020: EF 35-45%. LVEDP = 22. 70% o-pRCA, 100% thrombotic occlusion p-mLCx with suspected vasospasm (3.0 x 22 mm Onyx Frontier DES), 60% OM1. Case complicated by development of A.fib --> amiodarone gtt started --> developed prolonged QTc and polymorphic VT requiring multiple defibfrillations --> amiodarone discontinued.   Aortic atherosclerosis (HCC)    Asymptomatic bradycardia    Back pain    BPH (benign prostatic hyperplasia)    CKD (chronic kidney disease), stage III (HCC)    Colon polyp    COPD (chronic obstructive pulmonary disease) (HCC)    Coronary artery disease 12/20/2020   a.) inf STEMI 12/20/2020 --> LHC/PCI: 70% o-pRCA, 100% thrombotic occlusion p-mLCx with suspected vasospasm (3.0 x 22 mm Onyx Frontier DES), 60% OM1; b.) LHC 02/23/2021: 50% OM1, 80% dLAD, 50% LPAV, 40% o-pRCA - med mgmt   Diverticular disease    Edentulous    GERD (gastroesophageal reflux disease)    Hepatic cyst    HFrEF (heart failure with reduced ejection fraction) (HCC)    a.) TTE 12/21/2020: EF 50%, mild LVH, mod inferolat HK, G2DD; b.) TTE 12/22/2020: EF 45-50%, inferolat HK, mild LVH; c.) TTE 06/25/2021: EF 45%, mild LCH, G2DD; d.) TTE 04/27/2022: EF 35-40%, glob HK (sev inf HK), mild LVE, G2DD, mild-mod MR   History of 2019 novel coronavirus disease (COVID-19) 04/2022   a.) rapid home Ag testing (+)   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 01/08/2021   a.) occurred in setting of inferior STEMI (during LHC) --> developed A.fib --> amiodarone gtt started --> developed prolonged QTc and polymorphic VT requiring multiple defibrillations --> amiodarone discontinued   Hyperlipidemia LDL goal <70    Hypertension    Incarcerated umbilical hernia    Infrarenal abdominal aortic aneurysm (AAA) without rupture (HCC) 12/02/2022   a.) CT A/P 12/02/2022: 4.1 cm   Ischemic cardiomyopathy 12/20/2020   a.) LHC 12/20/2020: EF 35-45%; b.) TTE 12/21/2020: EF 50%, c.) TTE 12/22/2020: EF  45-50%; d.) LHC 02/23/2021: EF 35-45%; e.) TTE 06/25/2021: EF 45%; f.) TTE 04/27/2022: EF 35-40%; g.) MV 05/06/2022: EF 48%   Long term current use of aspirin    Long term current use of clopidogrel    NSVT (nonsustained ventricular tachycardia) (HCC) 05/08/2021   a.) Zio 04/28/2021: 5 runs with the fastest lasting 12 beats (max rate 190);  b.) live tele study 06/23/2021: 5 runs with longested lasting 10 beasts (max rate 103); c.) Zio 08/31/2022: 50 runs with longest lasting 13.8 seconds (max rate 159); d.) Zio 11/04/2022: 1 run lasting 4 beats   PAF (paroxysmal atrial fibrillation) (HCC)    a.) CHA2DS2-VASc = 4 (age, HFrEF, HTN, previous MI) as of 01/28/2023; b.) cardiac rate/rhythm maintained on oral metoprolol succinate; no chronically anticoagulation (is on DAPT)   Palpitations (PAC/PVCs)    a.) live tele study 06/23/2021: freq PACs (11.6% burden) and occassional PVCs (2.9% burden); b.) Zio 08/31/2022: freq PACs (17.3% burden) and occassional PVCs (2.4% burden); c.) Zio 11/04/2022: improved PAC burden down to 8.4% with rare PVCs   PSVT (paroxysmal supraventricular tachycardia) (HCC) 04/28/2021   a.) Zio 04/28/2021: 119 runs with fastest lasting 7 beats (max rate 200) and longest lasting 13.7 seconds (max rate 122); b.) live tele study 06/23/2021: 109 runs with longest lasting 29.4 seconds (max rate 100); c.) Zio 08/31/2022: 196 runs with longest lasting 22.1 seconds (max rate 166); d.) Zio 30 runs with longest lasting 26.5 seconds   Smoking      No family history on file.   Past Surgical History:  Procedure Laterality Date   COLONOSCOPY WITH PROPOFOL N/A 05/07/2022   Procedure: COLONOSCOPY WITH PROPOFOL;  Surgeon: Wyline Mood, MD;  Location: [facility];  Service: Gastroenterology;  Laterality: N/A;   CORONARY/GRAFT ACUTE MI REVASCULARIZATION N/A 12/20/2020   Procedure: Coronary/Graft Acute MI Revascularization;  Surgeon: [name], MD;  Location: [facility] INVASIVE CV LAB;  Service:  Cardiovascular;  Laterality: N/A;   FRACTURE SURGERY     Left leg fracture, rod placement   INGUINAL HERNIA REPAIR Bilateral    LEFT HEART CATH AND CORONARY ANGIOGRAPHY N/A 12/20/2020   Procedure: LEFT HEART CATH AND CORONARY ANGIOGRAPHY;  Surgeon: [name], MD;  Location: [facility] INVASIVE CV LAB;  Service: Cardiovascular;  Laterality: N/A;   LEFT HEART CATH AND CORONARY ANGIOGRAPHY N/A 02/23/2021   Procedure: LEFT HEART CATH AND CORONARY ANGIOGRAPHY;  Surgeon: [name], MD;  Location: [facility] INVASIVE CV LAB;  Service: Cardiovascular;  Laterality: N/A;   UMBILICAL HERNIA REPAIR N/A 02/01/2023   Procedure: HERNIA REPAIR UMBILICAL ADULT, open;  Surgeon: Henrene Dodge, MD;  Location: [facility] ORS;  Service: General;  Laterality: N/A;    Social History   Socioeconomic History   Marital status: Divorced    Spouse name: Not on file   Number of children: Not on file   Years of education: Not on file   Highest education level: Not on file  Occupational History   Not on file  Tobacco Use   Smoking status: Former    Current packs/day: 0.00    Average packs/day: 1.5 packs/day for 45.0 years (67.5 ttl pk-yrs)    Types: Cigarettes    Start date: 12/21/1975    Quit date: 12/20/2020    Years since quitting: 2.1    Passive exposure: Past   Smokeless tobacco: Never  Vaping Use   Vaping status: Never Used  Substance and Sexual Activity   Alcohol use: Not Currently   Drug use: Never   Sexual activity: Not on file  Other Topics Concern   Not on file  Social History Narrative   Not on file   Social Determinants of Health   Financial Resource Strain: Not on file  Food Insecurity: No Food Insecurity (12/03/2022)   Hunger Vital Sign    Worried About Running Out of Food in the Last Year: Never true  Ran Out of Food in the Last Year: Never true  Transportation Needs: No Transportation Needs (12/03/2022)   PRAPARE - Administrator, Civil Service (Medical): No    Lack of  Transportation (Non-Medical): No  Physical Activity: Not on file  Stress: Not on file  Social Connections: Not on file  Intimate Partner Violence: Not At Risk (12/03/2022)   Humiliation, Afraid, Rape, and Kick questionnaire    Fear of Current or Ex-Partner: No    Emotionally Abused: No    Physically Abused: No    Sexually Abused: No     Allergies  Allergen Reactions   Amiodarone Other (See Comments)    QT prolongation in the context of inferior STEMI and IV amiodarone use. Avoid all QT prolonging medications.      CBC    Component Value Date/Time   WBC 6.5 12/07/2022 0618   RBC 4.85 12/07/2022 0618   HGB 15.1 12/07/2022 0618   HGB 14.6 03/30/2021 0849   HCT 43.8 12/07/2022 0618   HCT 41.6 03/30/2021 0849   PLT 197 12/07/2022 0618   PLT 229 03/30/2021 0849   MCV 90.3 12/07/2022 0618   MCV 89 03/30/2021 0849   MCH 31.1 12/07/2022 0618   MCHC 34.5 12/07/2022 0618   RDW 13.7 12/07/2022 0618   RDW 12.0 03/30/2021 0849   LYMPHSABS 2.1 06/01/2021 0705   LYMPHSABS 2.1 03/30/2021 0849   MONOABS 0.3 06/01/2021 0705   EOSABS 0.4 06/01/2021 0705   EOSABS 0.5 (H) 03/30/2021 0849   BASOSABS 0.1 06/01/2021 0705   BASOSABS 0.1 03/30/2021 0849    Pulmonary Functions Testing Results:    Latest Ref Rng & Units 10/12/2022    8:36 AM  PFT Results  FVC-Pre L 3.87   FVC-Predicted Pre % 78   FVC-Post L 3.96   FVC-Predicted Post % 80   Pre FEV1/FVC % % 69   Post FEV1/FCV % % 68   FEV1-Pre L 2.68   FEV1-Predicted Pre % 72   FEV1-Post L 2.71   DLCO uncorrected ml/min/mmHg 16.02   DLCO UNC% % 56   DLVA Predicted % 57   TLC L 7.79   TLC % Predicted % 105   RV % Predicted % 150     Outpatient Medications Prior to Visit  Medication Sig Dispense Refill   aspirin 81 MG chewable tablet Chew 1 tablet (81 mg total) by mouth daily. (Patient taking differently: Chew 81 mg by mouth every morning.) 30 tablet 0   atorvastatin (LIPITOR) 80 MG tablet TAKE ONE TABLET BY MOUTH ONE TIME DAILY  (Patient taking differently: Take 80 mg by mouth every morning.) 90 tablet 3   buPROPion (WELLBUTRIN XL) 300 MG 24 hr tablet Take 300 mg by mouth every morning.     clopidogrel (PLAVIX) 75 MG tablet Take 1 tablet (75 mg total) by mouth daily. (Patient taking differently: Take 75 mg by mouth every morning.) 90 tablet 3   escitalopram (LEXAPRO) 5 MG tablet Take 5 mg by mouth daily.     esomeprazole (NEXIUM) 20 MG capsule Take 1 capsule (20 mg total) by mouth daily at 12 noon. (Patient taking differently: Take 20 mg by mouth every morning.) 30 capsule 6   furosemide (LASIX) 20 MG tablet Take 1 tablet (20 mg total) by mouth daily. (Patient taking differently: Take 20 mg by mouth every morning.) 90 tablet 3   metoprolol succinate (TOPROL XL) 25 MG 24 hr tablet Take 0.5 tablets (12.5 mg total) by mouth at  bedtime. (Patient taking differently: Take 12.5 mg by mouth in the morning.)     nitroGLYCERIN (NITROSTAT) 0.4 MG SL tablet Place 1 tablet (0.4 mg total) under the tongue every 5 (five) minutes as needed for chest pain. 25 tablet 3   sacubitril-valsartan (ENTRESTO) 24-26 MG Take 1 tablet by mouth 2 (two) times daily.     spironolactone (ALDACTONE) 25 MG tablet Take 0.5 tablets (12.5 mg total) by mouth daily. (Patient taking differently: Take 12.5 mg by mouth every morning.) 45 tablet 1   tamsulosin (FLOMAX) 0.4 MG CAPS capsule Take 0.4 mg by mouth daily after supper.     umeclidinium-vilanterol (ANORO ELLIPTA) 62.5-25 MCG/ACT AEPB Inhale 1 puff into the lungs daily. (Patient taking differently: Inhale 1 puff into the lungs every morning.) 30 each 11   oxyCODONE (OXY IR/ROXICODONE) 5 MG immediate release tablet Take 1 tablet (5 mg total) by mouth every 4 (four) hours as needed for severe pain (pain score 7-10). 30 tablet 0   HYDROcodone-acetaminophen (NORCO) 10-325 MG tablet Take 1 tablet by mouth in the morning, at noon, in the evening, and at bedtime. (Patient not taking: Reported on 02/11/2023)      acetaminophen (TYLENOL) 500 MG tablet Take 2 tablets (1,000 mg total) by mouth every 8 (eight) hours as needed for mild pain (pain score 1-3). (Patient not taking: Reported on 02/11/2023)     No facility-administered medications prior to visit.

## 2023-02-15 LAB — ALPHA-1-ANTITRYPSIN: A-1 Antitrypsin, Ser: 176 mg/dL (ref 101–187)

## 2023-02-16 ENCOUNTER — Telehealth: Payer: Self-pay | Admitting: Cardiovascular Disease

## 2023-02-16 ENCOUNTER — Encounter: Payer: Self-pay | Admitting: Physician Assistant

## 2023-02-16 ENCOUNTER — Ambulatory Visit (INDEPENDENT_AMBULATORY_CARE_PROVIDER_SITE_OTHER): Payer: 59 | Admitting: Physician Assistant

## 2023-02-16 VITALS — BP 104/59 | HR 70 | Temp 98.0°F | Ht 72.0 in | Wt 195.0 lb

## 2023-02-16 DIAGNOSIS — Z09 Encounter for follow-up examination after completed treatment for conditions other than malignant neoplasm: Secondary | ICD-10-CM

## 2023-02-16 DIAGNOSIS — K420 Umbilical hernia with obstruction, without gangrene: Secondary | ICD-10-CM

## 2023-02-16 DIAGNOSIS — K810 Acute cholecystitis: Secondary | ICD-10-CM

## 2023-02-16 NOTE — Patient Instructions (Signed)

## 2023-02-16 NOTE — Telephone Encounter (Signed)
Patient brought in [employer] patient assistance application for completion. Placed in [employer], RN's box.

## 2023-02-16 NOTE — Progress Notes (Signed)
[facility] POST-OP OFFICE VISIT  02/16/2023  HPI: KALIQ CONGLETON is a 67 y.o. male 15 days s/p robotic assisted laparoscopic cholecystectomy and umbilical hernia repair with Dr Aleen Campi   He is doing very well No complaints of pain No fever, chills, nausea, emesis He is tolerating PO; bowel function normal No issues with incisions No other complaints   Vital signs: BP (!) 104/59   Pulse 70   Temp 98 F (36.7 C)   Ht 6' (1.829 m)   Wt 195 lb (88.5 kg)   SpO2 98%   BMI 26.45 kg/m    Physical Exam: Constitutional: Well appearing male, NAD Abdomen: Soft, non-tender, non-distended, no rebound/guarding Skin: Laparoscopic incisions are healing well, no erythema or drainage   Assessment/Plan: This is a 67 y.o. male 15 days s/p robotic assisted laparoscopic cholecystectomy and umbilical hernia repair with Dr Aleen Campi    - Pain control prn  - Reviewed wound care recommendation  - Reviewed lifting restrictions; 4-6 weeks total  - Reviewed surgical pathology; CCC  - He can follow up on as needed basis; He understands to call with questions/concerns  -- Lynden Oxford, PA-C  [facility] 02/16/2023, 2:53 PM M-F: 7am - 4pm

## 2023-02-17 NOTE — Telephone Encounter (Signed)
Application completed and faxed to company 

## 2023-03-25 ENCOUNTER — Ambulatory Visit: Payer: 59 | Attending: Cardiovascular Disease | Admitting: Cardiovascular Disease

## 2023-03-25 ENCOUNTER — Encounter: Payer: Self-pay | Admitting: Cardiovascular Disease

## 2023-03-25 VITALS — BP 100/60 | HR 63 | Ht 72.0 in | Wt 200.4 lb

## 2023-03-25 DIAGNOSIS — E785 Hyperlipidemia, unspecified: Secondary | ICD-10-CM

## 2023-03-25 DIAGNOSIS — I5022 Chronic systolic (congestive) heart failure: Secondary | ICD-10-CM

## 2023-03-25 DIAGNOSIS — I2510 Atherosclerotic heart disease of native coronary artery without angina pectoris: Secondary | ICD-10-CM | POA: Diagnosis not present

## 2023-03-25 DIAGNOSIS — I714 Abdominal aortic aneurysm, without rupture, unspecified: Secondary | ICD-10-CM

## 2023-03-25 NOTE — Patient Instructions (Addendum)
Medication Instructions:  No changes *If you need a refill on your cardiac medications before your next appointment, please call your pharmacy*   Lab Work: None ordered If you have labs (blood work) drawn today and your tests are completely normal, you will receive your results only by: MyChart Message (if you have MyChart) OR A paper copy in the mail If you have any lab test that is abnormal or we need to change your treatment, we will call you to review the results.   Testing/Procedures: Your physician has requested that you have an abdominal aorta duplex in 3 months. During this test, an ultrasound is used to evaluate the aorta. Allow 30 minutes for this exam. Do not eat after midnight the day before and avoid carbonated beverages. This will take place at [address] [facility], [address].   Follow-Up: At [facility], you and your health needs are our priority.  As part of our continuing mission to provide you with exceptional heart care, we have created designated Provider Care Teams.  These Care Teams include your primary Cardiologist (physician) and Advanced Practice Providers (APPs -  Physician Assistants and Nurse Practitioners) who all work together to provide you with the care you need, when you need it.  We recommend signing up for the patient portal called "MyChart".  Sign up information is provided on this After Visit Summary.  MyChart is used to connect with patients for Virtual Visits (Telemedicine).  Patients are able to view lab/test results, encounter notes, upcoming appointments, etc.  Non-urgent messages can be sent to your provider as well.   To learn more about what you can do with MyChart, go to [email or web address].    Your next appointment:   6 month(s)  Provider:   You may see Lorine Bears, MD or one of the following Advanced Practice Providers on your designated Care Team:   Nicolasa Ducking, NP Eula Listen, PA-C Cadence Fransico Michael, PA-C Charlsie Quest,  NP Carlos Levering, NP

## 2023-03-25 NOTE — Progress Notes (Signed)
Cardiology Office Note   Date:  03/25/2023   ID:  Darryl Gilbert, Darryl Gilbert [birth year], MRN [id number]  PCP:  Dortha Kern, MD  Cardiologist:   Lorine Bears, MD   Chief Complaint  Patient presents with   4 month follow up     Patient c/o shortness of breath at times. Medications reviewed by the patient verbally.       History of Present Illness: Darryl Gilbert is a 67 y.o. male who presents for follow-up visit regarding coronary artery disease and chronic systolic heart failure. He has known history of essential hypertension, hyperlipidemia, chronic back pain, COPD and previous tobacco use. He presented in September 2022 with inferior ST elevation myocardial infarction.  Emergent cardiac catheterization showed codominant coronary arteries with thrombotic occlusion of the proximal left circumflex.  I performed successful angioplasty and drug-eluting stent placement to the left circumflex.  He had atrial fibrillation that was treated with amiodarone drip and then he converted to sinus rhythm.  Echocardiogram showed an EF of 50%.  While on amiodarone, he developed prolonged QT interval and frequent PVCs followed by polymorphic ventricular tachycardia that required multiple shocks.  Amiodarone was discontinued.  Repeat echocardiogram showed stable EF of 45 to 50%. He continued to have significant shortness of breath and thus he underwent repeat cardiac catheterization in November 2022  which showed patent left circumflex stent with no significant restenosis with an EF of 35 to 40%.  Metoprolol was discontinued during that time due to bradycardia with heart rate in the 40s. Echocardiogram in March 2023 showed an EF of 45%. He quit smoking after his myocardial infarction. He had a repeat echocardiogram in January of this year which showed an EF of 35 to 40% with mild to moderate mitral regurgitation.  He underwent a Lexiscan Myoview also in January which showed evidence of prior infarct in the left  circumflex distribution without ischemia.  EF was 48%.  He underwent cholecystectomy in October without complications.  He has been doing well with no chest pain.  He reports stable exertional dyspnea with no orthopnea, PND or lower extremity edema.   Past Medical History:  Diagnosis Date   Abdominal aortic aneurysm (AAA) (HCC)    Acute cholecystitis    Acute ST elevation myocardial infarction (STEMI) of inferior wall (HCC) 12/20/2020   a.) LHC/PCI 12/20/2020: EF 35-45%. LVEDP = 22. 70% o-pRCA, 100% thrombotic occlusion p-mLCx with suspected vasospasm (3.0 x 22 mm Onyx Frontier DES), 60% OM1. Case complicated by development of A.fib --> amiodarone gtt started --> developed prolonged QTc and polymorphic VT requiring multiple defibfrillations --> amiodarone discontinued.   Aortic atherosclerosis (HCC)    Asymptomatic bradycardia    Back pain    BPH (benign prostatic hyperplasia)    CKD (chronic kidney disease), stage III (HCC)    Colon polyp    COPD (chronic obstructive pulmonary disease) (HCC)    Coronary artery disease 12/20/2020   a.) inf STEMI 12/20/2020 --> LHC/PCI: 70% o-pRCA, 100% thrombotic occlusion p-mLCx with suspected vasospasm (3.0 x 22 mm Onyx Frontier DES), 60% OM1; b.) LHC 02/23/2021: 50% OM1, 80% dLAD, 50% LPAV, 40% o-pRCA - med mgmt   Diverticular disease    Edentulous    GERD (gastroesophageal reflux disease)    Hepatic cyst    HFrEF (heart failure with reduced ejection fraction) (HCC)    a.) TTE 12/21/2020: EF 50%, mild LVH, mod inferolat HK, G2DD; b.) TTE 12/22/2020: EF 45-50%, inferolat HK, mild LVH; c.) TTE 06/25/2021:  EF 45%, mild LCH, G2DD; d.) TTE 04/27/2022: EF 35-40%, glob HK (sev inf HK), mild LVE, G2DD, mild-mod MR   History of 2019 novel coronavirus disease (COVID-19) 04/2022   a.) rapid home Ag testing (+)   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 01/08/2021   a.) occurred in setting of inferior STEMI (during LHC) --> developed  A.fib --> amiodarone gtt started --> developed prolonged QTc and polymorphic VT requiring multiple defibrillations --> amiodarone discontinued   Hyperlipidemia LDL goal <70    Hypertension    Incarcerated umbilical hernia    Infrarenal abdominal aortic aneurysm (AAA) without rupture (HCC) 12/02/2022   a.) CT A/P 12/02/2022: 4.1 cm   Ischemic cardiomyopathy 12/20/2020   a.) LHC 12/20/2020: EF 35-45%; b.) TTE 12/21/2020: EF 50%, c.) TTE 12/22/2020: EF 45-50%; d.) LHC 02/23/2021: EF 35-45%; e.) TTE 06/25/2021: EF 45%; f.) TTE 04/27/2022: EF 35-40%; g.) MV 05/06/2022: EF 48%   Long term current use of aspirin    Long term current use of clopidogrel    NSVT (nonsustained ventricular tachycardia) (HCC) 05/08/2021   a.) Zio 04/28/2021: 5 runs with the fastest lasting 12 beats (max rate 190); b.) live tele study 06/23/2021: 5 runs with longested lasting 10 beasts (max rate 103); c.) Zio 08/31/2022: 50 runs with longest lasting 13.8 seconds (max rate 159); d.) Zio 11/04/2022: 1 run lasting 4 beats   PAF (paroxysmal atrial fibrillation) (HCC)    a.) CHA2DS2-VASc = 4 (age, HFrEF, HTN, previous MI) as of 01/28/2023; b.) cardiac rate/rhythm maintained on oral metoprolol succinate; no chronically anticoagulation (is on DAPT)   Palpitations (PAC/PVCs)    a.) live tele study 06/23/2021: freq PACs (11.6% burden) and occassional PVCs (2.9% burden); b.) Zio 08/31/2022: freq PACs (17.3% burden) and occassional PVCs (2.4% burden); c.) Zio 11/04/2022: improved PAC burden down to 8.4% with rare PVCs   PSVT (paroxysmal supraventricular tachycardia) (HCC) 04/28/2021   a.) Zio 04/28/2021: 119 runs with fastest lasting 7 beats (max rate 200) and longest lasting 13.7 seconds (max rate 122); b.) live tele study 06/23/2021: 109 runs with longest lasting 29.4 seconds (max rate 100); c.) Zio 08/31/2022: 196 runs with longest lasting 22.1 seconds (max rate 166); d.) Zio 30 runs with longest lasting 26.5 seconds   Smoking      Past Surgical History:  Procedure Laterality Date   COLONOSCOPY WITH PROPOFOL N/A 05/07/2022   Procedure: COLONOSCOPY WITH PROPOFOL;  Surgeon: Wyline Mood, MD;  Location: [facility] ENDOSCOPY;  Service: Gastroenterology;  Laterality: N/A;   CORONARY/GRAFT ACUTE MI REVASCULARIZATION N/A 12/20/2020   Procedure: Coronary/Graft Acute MI Revascularization;  Surgeon: [name], MD;  Location: [facility] INVASIVE CV LAB;  Service: Cardiovascular;  Laterality: N/A;   FRACTURE SURGERY     Left leg fracture, rod placement   INGUINAL HERNIA REPAIR Bilateral    LEFT HEART CATH AND CORONARY ANGIOGRAPHY N/A 12/20/2020   Procedure: LEFT HEART CATH AND CORONARY ANGIOGRAPHY;  Surgeon: [name], MD;  Location: [facility] INVASIVE CV LAB;  Service: Cardiovascular;  Laterality: N/A;   LEFT HEART CATH AND CORONARY ANGIOGRAPHY N/A 02/23/2021   Procedure: LEFT HEART CATH AND CORONARY ANGIOGRAPHY;  Surgeon: [name], MD;  Location: [facility] INVASIVE CV LAB;  Service: Cardiovascular;  Laterality: N/A;   UMBILICAL HERNIA REPAIR N/A 02/01/2023   Procedure: HERNIA REPAIR UMBILICAL ADULT, open;  Surgeon: Henrene Dodge, MD;  Location: [facility] ORS;  Service: General;  Laterality: N/A;     Current Outpatient Medications  Medication Sig Dispense Refill  albuterol (VENTOLIN HFA) 108 (90 Base) MCG/ACT inhaler Inhale 2 puffs into the lungs every 6 (six) hours as needed for wheezing or shortness of breath. 8 g 2   aspirin 81 MG chewable tablet Chew 1 tablet (81 mg total) by mouth daily. (Patient taking differently: Chew 81 mg by mouth every morning.) 30 tablet 0   atorvastatin (LIPITOR) 80 MG tablet TAKE ONE TABLET BY MOUTH ONE TIME DAILY (Patient taking differently: Take 80 mg by mouth every morning.) 90 tablet 3   buPROPion (WELLBUTRIN XL) 300 MG 24 hr tablet Take 300 mg by mouth every morning.     clopidogrel (PLAVIX) 75 MG tablet Take 1 tablet (75 mg total) by mouth daily. (Patient taking differently: Take 75 mg by  mouth every morning.) 90 tablet 3   escitalopram (LEXAPRO) 5 MG tablet Take 5 mg by mouth daily.     esomeprazole (NEXIUM) 20 MG capsule Take 1 capsule (20 mg total) by mouth daily at 12 noon. (Patient taking differently: Take 20 mg by mouth every morning.) 30 capsule 6   furosemide (LASIX) 20 MG tablet Take 1 tablet (20 mg total) by mouth daily. (Patient taking differently: Take 20 mg by mouth every morning.) 90 tablet 3   HYDROcodone-acetaminophen (NORCO) 10-325 MG tablet Take 1 tablet by mouth in the morning, at noon, in the evening, and at bedtime.     metoprolol succinate (TOPROL XL) 25 MG 24 hr tablet Take 0.5 tablets (12.5 mg total) by mouth at bedtime. (Patient taking differently: Take 12.5 mg by mouth in the morning.)     nitroGLYCERIN (NITROSTAT) 0.4 MG SL tablet Place 1 tablet (0.4 mg total) under the tongue every 5 (five) minutes as needed for chest pain. 25 tablet 3   sacubitril-valsartan (ENTRESTO) 24-26 MG Take 1 tablet by mouth 2 (two) times daily.     tamsulosin (FLOMAX) 0.4 MG CAPS capsule Take 0.4 mg by mouth daily after supper.     umeclidinium-vilanterol (ANORO ELLIPTA) 62.5-25 MCG/ACT AEPB Inhale 1 puff into the lungs daily. (Patient taking differently: Inhale 1 puff into the lungs every morning.) 30 each 11   spironolactone (ALDACTONE) 25 MG tablet Take 0.5 tablets (12.5 mg total) by mouth daily. (Patient taking differently: Take 12.5 mg by mouth every morning.) 45 tablet 1   No current facility-administered medications for this visit.    Allergies:   Amiodarone    Social History:  The patient  reports that he quit smoking about 2 years ago. His smoking use included cigarettes. He started smoking about 47 years ago. He has a 67.5 pack-year smoking history. He has been exposed to tobacco smoke. He has never used smokeless tobacco. He reports that he does not currently use alcohol. He reports that he does not use drugs.   Family History:  The patient's family history is not  on file.    ROS:  Please see the history of present illness.   Otherwise, review of systems are positive for none.   All other systems are reviewed and negative.    PHYSICAL EXAM: VS:  BP 100/60 (BP Location: Left Arm, Patient Position: Sitting, Cuff Size: Normal)   Pulse 63   Ht 6' (1.829 m)   Wt 200 lb 6 oz (90.9 kg)   SpO2 98%   BMI 27.18 kg/m  , BMI Body mass index is 27.18 kg/m. GEN: Well nourished, well developed, in no acute distress  HEENT: normal  Neck: no JVD, carotid bruits, or masses Cardiac: RRR; no murmurs,  rubs, or gallops,no edema  Respiratory:  clear to auscultation bilaterally, normal work of breathing GI: soft, nontender, nondistended, + BS MS: no deformity or atrophy  Skin: warm and dry, no rash Neuro:  Strength and sensation are intact Psych: euthymic mood, full affect   EKG:  EKG is ordered today. The ekg ordered today demonstrates: Sinus rhythm with Premature atrial complexes with Abberant conduction When compared with ECG of 17-Dec-2022 08:33, Abberant conduction is now Present    Recent Labs: 12/06/2022: Magnesium 2.5 12/07/2022: ALT 73; Hemoglobin 15.1; Platelets 197 01/25/2023: BUN 20; Creatinine, Ser 1.38; Potassium 3.9; Sodium 137    Lipid Panel    Component Value Date/Time   CHOL 130 03/04/2022 1017   CHOL 166 02/04/2021 0909   TRIG 175 (H) 03/04/2022 1017   HDL 37 (L) 03/04/2022 1017   HDL 38 (L) 02/04/2021 0909   CHOLHDL 3.5 03/04/2022 1017   VLDL 35 03/04/2022 1017   LDLCALC 58 03/04/2022 1017   LDLCALC 102 (H) 02/04/2021 0909      Wt Readings from Last 3 Encounters:  03/25/23 200 lb 6 oz (90.9 kg)  02/16/23 195 lb (88.5 kg)  02/11/23 198 lb 6.4 oz (90 kg)          No data to display            ASSESSMENT AND PLAN:  1.  Coronary artery disease involving native coronary arteries without angina: No recurrent chest pain and overall he is doing reasonably well from a cardiac standpoint.   He does have ectasia in the  right coronary artery and thus I elected to keep him on long-term dual antiplatelet therapy.    2 . chronic systolic heart failure due to ischemic cardiomyopathy: No evidence of volume overload.  Continue small dose Toprol which cannot be increased due to underlying bradycardia.  Continue Entresto and spironolactone.  Will consider adding an SGLT2 inhibitor in the future.  3.  Hyperlipidemia: Continue high-dose atorvastatin.  Most recent lipid profile showed an LDL of 58.  4.  Abdominal aortic aneurysm: Measuring 4.1 cm on recent CT scan of the abdomen in August.  I requested a follow-up AAA scan to be done in 3 months from now to ensure stability.  If there is no significant growth, we will plan on following this on an annual basis.  5.  Frequent PACs and occasional PVCs: Symptoms are controlled with small dose Toprol.    Disposition:   FU with me in 6 months  Signed,  Lorine Bears, MD  03/25/2023 9:07 AM    [facility]

## 2023-05-09 ENCOUNTER — Other Ambulatory Visit: Payer: Self-pay

## 2023-05-09 DIAGNOSIS — J439 Emphysema, unspecified: Secondary | ICD-10-CM

## 2023-05-09 MED ORDER — ALBUTEROL SULFATE HFA 108 (90 BASE) MCG/ACT IN AERS: 2.0000 | INHALATION_SPRAY | Freq: Four times a day (QID) | RESPIRATORY_TRACT | 6 refills | Status: DC | PRN

## 2023-05-09 NOTE — Progress Notes (Signed)
Received a fax from [facility] asking for a refill on the patient's Albuterol.  Refill has been sent in .  Nothing further needed.

## 2023-05-26 ENCOUNTER — Other Ambulatory Visit: Payer: Self-pay | Admitting: Cardiovascular Disease

## 2023-05-31 ENCOUNTER — Encounter: Payer: Self-pay | Admitting: Cardiovascular Disease

## 2023-06-01 MED ORDER — SACUBITRIL-VALSARTAN 24-26 MG PO TABS: 1.0000 | ORAL_TABLET | Freq: Two times a day (BID) | ORAL | 3 refills | Status: DC

## 2023-06-02 ENCOUNTER — Telehealth: Payer: Self-pay | Admitting: *Deleted

## 2023-06-02 NOTE — Telephone Encounter (Signed)
Reviewed with patient approval date and number to call for his medication. He verbalized understanding with no further questions.

## 2023-06-24 ENCOUNTER — Ambulatory Visit: Payer: 59 | Attending: Cardiovascular Disease

## 2023-06-24 DIAGNOSIS — I7143 Infrarenal abdominal aortic aneurysm, without rupture: Secondary | ICD-10-CM

## 2023-06-24 DIAGNOSIS — I714 Abdominal aortic aneurysm, without rupture, unspecified: Secondary | ICD-10-CM

## 2023-06-27 ENCOUNTER — Other Ambulatory Visit: Payer: Self-pay | Admitting: *Deleted

## 2023-06-27 ENCOUNTER — Other Ambulatory Visit: Payer: Self-pay | Admitting: Cardiovascular Disease

## 2023-06-27 DIAGNOSIS — I714 Abdominal aortic aneurysm, without rupture, unspecified: Secondary | ICD-10-CM

## 2023-07-29 ENCOUNTER — Ambulatory Visit: Attending: Cardiology | Admitting: Cardiology

## 2023-07-29 ENCOUNTER — Encounter: Payer: Self-pay | Admitting: Cardiology

## 2023-07-29 VITALS — BP 110/70 | HR 60 | Ht 71.0 in | Wt 207.0 lb

## 2023-07-29 DIAGNOSIS — Z8679 Personal history of other diseases of the circulatory system: Secondary | ICD-10-CM | POA: Diagnosis not present

## 2023-07-29 DIAGNOSIS — I5022 Chronic systolic (congestive) heart failure: Secondary | ICD-10-CM

## 2023-07-29 DIAGNOSIS — I471 Supraventricular tachycardia, unspecified: Secondary | ICD-10-CM | POA: Diagnosis not present

## 2023-07-29 DIAGNOSIS — R002 Palpitations: Secondary | ICD-10-CM | POA: Diagnosis not present

## 2023-07-29 NOTE — Progress Notes (Signed)
 Electrophysiology Clinic Note    Date:  07/29/2023  Patient ID:  Darryl Gilbert 04/18/1956, MRN [id number] PCP:  Dortha Kern, MD  Cardiologist:  Lorine Bears, MD Electrophysiologist: Lanier Prude, MD    Discussed the use of AI scribe software for clinical note transcription with the patient, who gave verbal consent to proceed.   Patient Profile    Chief Complaint: palpitation follow-up  History of Present Illness: Darryl Gilbert is a 68 y.o. male with PMH notable for CAD s/p MI (2022), SVT, HFrecEF (still mid-range), Afib w RVR (in setting of MI), polymorphic VT in setting of amiodarone use, AAA, COPD; seen today for Lanier Prude, MD for routine electrophysiology followup.   He last saw Dr. Lalla Brothers 10/2022 where palpitations were under better control with 12.5mg  toprol. He did not tolerate 25mg  d/t SE, or bisoprolol.   On follow-up today, he feels at his baseline. His main complaints today include SOB - specifically when he lays flat. He has tried multiple different inhalers and nothing has improved it. He does continue to have palpitations, they are infrequent and not particularly bothersome to him. He also has some fatigue, but thinks it is due to the medications and is manageable.   He denies chest pain, chest pressure, dizziness, LH, edema.   Arrhythmia/Device History No specialty comments available.    ROS:  Please see the history of present illness. All other systems are reviewed and otherwise negative.    Physical Exam    VS:  BP 110/70   Pulse 60   Ht 5\' 11"  (1.803 m)   Wt 207 lb (93.9 kg)   SpO2 98%   BMI 28.87 kg/m  BMI: Body mass index is 28.87 kg/m.  Wt Readings from Last 3 Encounters:  07/29/23 207 lb (93.9 kg)  03/25/23 200 lb 6 oz (90.9 kg)  02/16/23 195 lb (88.5 kg)     GEN- The patient is well appearing, alert and oriented x 3 today.   Lungs- Clear to ausculation bilaterally, normal work of breathing.  Heart- Regular rate and  rhythm with ectopy, no murmurs, rubs or gallops Extremities- No peripheral edema, warm, dry    Studies Reviewed   Previous EP, cardiology notes.    EKG is ordered. Personal review of EKG from today shows:    EKG Interpretation Date/Time:  Friday July 29 2023 14:09:35 EDT Ventricular Rate:  60 PR Interval:  144 QRS Duration:  92 QT Interval:  414 QTC Calculation: 414 R Axis:   97  Text Interpretation: Sinus rhythm with occasional Premature ventricular complexes Rightward axis Confirmed by Sherie Don [phone]) on 07/29/2023 2:11:18 PM    Long term monitor, 11/04/2022 HR 38 - 179 bpm, average 53 bpm. 1 NSVT lasting 4 beats. 30 nonsustained SVT, longest 26.5 seconds.  Frequent supraventricular ectopy, 8.4%. Rare ventricular ectopy. No atrial fibrillation. No sustained arrhythmias.   Improved burden of SVT and supraventricular ectopy compared to prior monitor.  Long term monitor, 09/02/2022 HR 46 - 226, average 65 bpm. 50 nonsustained SVT, longest 13.8 seconds with an average rate of 159 bpm. Frequent supraventricular ectopy, 17.3% Occasional ventricular ectopy, 2.4%   HR 41 - 245, average 61 bpm. 1 nonsustained VT lasting 5 beats 196 nonsustained SVT, longest 22.1 seconds with an average rate of 166 bpm. <1% AF burden Frequent supraventricular ectopy, 17.3% Occasional ventricular ectopy, 2.8%   TTE, 04/27/2022  1. Left ventricular ejection fraction, by estimation, is 35 to 40%. The  left ventricle has moderately decreased function. The left ventricle demonstrates global hypokinesis with severe hypokinesis of the inferior wall. The left ventricular internal cavity size was mildly dilated. Left ventricular diastolic parameters are consistent with Grade II diastolic dysfunction (pseudonormalization).   2. Right ventricular systolic function is normal. The right ventricular size is normal.   3. The mitral valve is normal in structure. Mild to moderate mitral valve regurgitation.  No evidence of mitral stenosis.   4. The aortic valve has an indeterminant number of cusps. Aortic valve regurgitation is not visualized. No aortic stenosis is present.   5. The inferior vena cava is normal in size with greater than 50% respiratory variability, suggesting right atrial pressure of 3 mmHg.   Comparison(s): 06/25/21-EF 45%.    Assessment and Plan     #) SVT #) palpitations Much better controlled symptomatically He was previously intolerant of higher doses of toprol or other BB. He is happy with his current level of palpitation control Continue 12.5mg  toprol daily   #) HFmrEF NYHA II symptoms Warm and Euvolemic on exam GDMT: toprol, entresto 24-26, spiro 12.5mg   Up titration of GDMT limited by bradycardia and BP Diuretic: 20mg  lasix daily Continue to follow-up with general cardiology    #) drug-induced PMVT Avoid QT prolonging medications       Current medicines are reviewed at length with the patient today.   The patient does not have concerns regarding his medicines.  The following changes were made today:  none  Labs/ tests ordered today include:  Orders Placed This Encounter  Procedures   EKG 12-Lead     Disposition: Follow up with Dr. Lalla Brothers or EP APP in 6 months   Signed, Sherie Don, NP  07/29/23  2:53 PM  Electrophysiology [facility]

## 2023-07-29 NOTE — Patient Instructions (Signed)
 Medication Instructions:   No changes *If you need a refill on your cardiac medications before your next appointment, please call your pharmacy*   Lab Work: Not needed    Testing/Procedures:  Not needed  Follow-Up: At [facility], you and your health needs are our priority.  As part of our continuing mission to provide you with exceptional heart care, we have created designated Provider Care Teams.  These Care Teams include your primary Cardiologist (physician) and Advanced Practice Providers (APPs -  Physician Assistants and Nurse Practitioners) who all work together to provide you with the care you need, when you need it.     Your next appointment:   6 month(s)  The format for your next appointment:   In Person  Provider:   Steffanie Dunn, MD or Sherie Don, NP

## 2023-09-21 ENCOUNTER — Encounter: Payer: Self-pay | Admitting: Medical

## 2023-09-21 ENCOUNTER — Ambulatory Visit: Attending: Medical | Admitting: Medical

## 2023-09-21 ENCOUNTER — Other Ambulatory Visit: Payer: Self-pay | Admitting: Cardiovascular Disease

## 2023-09-21 VITALS — BP 110/62 | HR 54 | Ht 71.0 in | Wt 206.4 lb

## 2023-09-21 DIAGNOSIS — E782 Mixed hyperlipidemia: Secondary | ICD-10-CM

## 2023-09-21 DIAGNOSIS — I714 Abdominal aortic aneurysm, without rupture, unspecified: Secondary | ICD-10-CM

## 2023-09-21 DIAGNOSIS — I25119 Atherosclerotic heart disease of native coronary artery with unspecified angina pectoris: Secondary | ICD-10-CM | POA: Diagnosis not present

## 2023-09-21 DIAGNOSIS — I471 Supraventricular tachycardia, unspecified: Secondary | ICD-10-CM | POA: Diagnosis not present

## 2023-09-21 DIAGNOSIS — I5022 Chronic systolic (congestive) heart failure: Secondary | ICD-10-CM | POA: Diagnosis not present

## 2023-09-21 DIAGNOSIS — Z79899 Other long term (current) drug therapy: Secondary | ICD-10-CM

## 2023-09-21 MED ORDER — DAPAGLIFLOZIN PROPANEDIOL 10 MG PO TABS: 10.0000 mg | ORAL_TABLET | Freq: Every day | ORAL | 3 refills | Status: DC

## 2023-09-21 NOTE — Progress Notes (Unsigned)
 Cardiology Office Note   Date:  09/21/2023  ID:  Ellen, Mayol 1955-07-19, MRN [id number] PCP: Claudine Cullens, MD  [facility] Providers Cardiologist:  Antionette Kirks, MD Electrophysiologist:  Boyce Byes, MD   History of Present Illness TYSHAWN CIULLO is a 68 y.o. 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   He presented in September 2022 with inferior STEMI. Emergent cardiac cath showed codominant coronary arteries with thrombotic occlusion of the proximal left circumflex.  The patient was treated with successful angioplasty and drug-eluting stent placement to the left circumflex.  He had atrial fibrillation that was treated with amiodarone  drip and then he converted to sinus rhythm.  Echo showed EF of 50%, while on amiodarone , he developed prolonged QT interval and frequent PVCs followed by polymorphic VT that required multiple shocks.  Amiodarone  was discontinued.  Repeat echocardiogram showed stable EF of 45 to 50%. He continued to have significant shortness of breath and this he underwent repeat cardiac catheterization in November 2022 which showed patent left  Cx stent with no significant restenosis with an EF of 35-40%. Metoprolol  was discontinued during that time due to bradycardia with HR in the 40s. Echo in March 2023 showed an EF 45%. He quit smoking after his MI.  He had a repeat echo in January 2024 that showed LVEF 35-40% with mild to mod MR. He underwent lexiscan  myoview  in January which showed evidence of prior infarction in the left Cx distribution without ischemia. EF was 48%.   The patient was last seen 07/29/23 by EP and was stable from a cardiac perspective.  Today, the patient reports he is overall OK. He feels SOB is improving. He has been doing normal activity. No chest pain. He has occasional  heart racing and palpitations. No lower leg edema.    Studies Reviewed EKG Interpretation Date/Time:  Wednesday September 21 2023 08:52:41 EDT Ventricular Rate:  54 PR Interval:  144 QRS Duration:  86 QT Interval:  432 QTC Calculation: 409 R Axis:   43  Text Interpretation: Sinus bradycardia When compared with ECG of 29-Jul-2023 14:09, Premature ventricular complexes are no longer Present T wave inversion no longer evident in Inferior leads Confirmed by Gennaro Khat, Linnaea Ahn ([id number]) on 09/21/2023 9:09:10 AM    Heart monitor 09/2022 HR 38 - 179 bpm, average 53 bpm. 1 NSVT lasting 4 beats. 30 nonsustained SVT, longest 26.5 seconds.  Frequent supraventricular ectopy, 8.4%. Rare ventricular ectopy. No atrial fibrillation. No sustained arrhythmias.   Improved burden of SVT and supraventricular ectopy compared to prior monitor.   Donelda Fujita T. Marven Slimmer, MD, [facility], [facility] Cardiac Electrophysiology   MPI 04/2022  Narrative & Impression  Pharmacological myocardial perfusion imaging study with no significant  ischemia Moderate-sized fixed perfusion defect in the basal to mid lateral wall consistent with prior MI Hypokinesis of the basal to mid lateral wall, EF estimated at 48%, GI uptake artifact noted No EKG changes concerning for ischemia at peak stress or in recovery. CT attenuation correction images with coronary disease and stenting in the left circumflex, mild coronary calcification in the LAD, no significant aortic atherosclerosis Low risk scan     Signed, Juanda Noon, MD, Ph.D [facility]   Echo 04/2022  1. Left ventricular ejection fraction, by estimation, is 35 to 40%. The  left ventricle has moderately decreased function.  The left ventricle  demonstrates global hypokinesis with severe hypokinesis of the inferior  wall. The left ventricular internal  cavity size was mildly dilated. Left ventricular diastolic parameters are  consistent with Grade II diastolic dysfunction  (pseudonormalization).   2. Right ventricular systolic function is normal. The right ventricular  size is normal.   3. The mitral valve is normal in structure. Mild to moderate mitral valve  regurgitation. No evidence of mitral stenosis.   4. The aortic valve has an indeterminant number of cusps. Aortic valve  regurgitation is not visualized. No aortic stenosis is present.   5. The inferior vena cava is normal in size with greater than 50%  respiratory variability, suggesting right atrial pressure of 3 mmHg.   Comparison(s): 06/25/21-EF 45%.        Physical Exam VS:  BP 110/62 (BP Location: Left Arm, Patient Position: Sitting, Cuff Size: Normal)   Pulse (!) 54   Ht 5\' 11"  (1.803 m)   Wt 206 lb 6.4 oz (93.6 kg)   SpO2 98%   BMI 28.79 kg/m    Wt Readings from Last 3 Encounters:  09/21/23 206 lb 6.4 oz (93.6 kg)  07/29/23 207 lb (93.9 kg)  03/25/23 200 lb 6 oz (90.9 kg)    GEN: Well nourished, well developed in no acute distress NECK: No JVD; No carotid bruits CARDIAC: RRR, no murmurs, rubs, gallops RESPIRATORY:  Clear to auscultation without rales, wheezing or rhonchi  ABDOMEN: Soft, non-tender, non-distended EXTREMITIES:  No edema; No deformity   ASSESSMENT AND PLAN  CAD The patient denies anginal symptoms. Due to ectasia in the RCA plan to continue long-term DAPT with ASA and Plavix . Continue Lipitor, Toprol , SL NTG.   Chronic systolic heart failure Echo 04/2022 showed LVEF 35-40%, global HK, G2DD. The patient is euvolemic on exam. I will add Farxiga 10mg  daily. BMET in 2 weeks. Continue lasix ,  Toprol , Entresto  and spironolactone .   HLD Repeat lipids today. Continue Lipitor 80mg  daily.   AAA Recent US  showed measurement of 3.7cm. plan for repeat study in 1 year.  PAC/PVCs H/o SVT He reports rare palpitations. Continue low dose Toprol .        Dispo: Follow-up in 6 months  Signed, Gicela Schwarting Rebekah Canada, PA-C

## 2023-09-21 NOTE — Patient Instructions (Signed)
 Medication Instructions:  Your physician recommends the following medication changes.  START TAKING:  Farxiga 10mg  by mouth daily   *If you need a refill on your cardiac medications before your next appointment, please call your pharmacy*  Lab Work: Your provider would like for you to have following labs drawn today CMET, MAGNESIUM , LIPID PANEL, CBC.    Your provider would like for you to return in 2 WEEKS to have the following labs drawn: BMET.   Please go to [facility] [address] ([facility] [address], [place] [address] You do not need an appointment.  They are open from 8 am- 4:30 pm.  Lunch from 1:00 pm- 2:00 pm  If you have labs (blood work) drawn today and your tests are completely normal, you will receive your results only by: MyChart Message (if you have MyChart) OR A paper copy in the mail If you have any lab test that is abnormal or we need to change your treatment, we will call you to review the results.  Testing/Procedures:  No test ordered today   Follow-Up: At [facility], you and your health needs are our priority.  As part of our continuing mission to provide you with exceptional heart care, our providers are all part of one team.  This team includes your primary Cardiologist (physician) and Advanced Practice Providers or APPs (Physician Assistants and Nurse Practitioners) who all work together to provide you with the care you need, when you need it.  Your next appointment:   6 month(s)  Provider:   You may see Antionette Kirks, MD or one of the following Advanced Practice Providers on your designated Care Team:    Cadence Stanton, PA-C

## 2023-09-22 ENCOUNTER — Ambulatory Visit: Payer: Self-pay

## 2023-09-22 LAB — CBC
Hematocrit: 45.4 % (ref 37.5–51.0)
Hemoglobin: 15.2 g/dL (ref 13.0–17.7)
MCH: 32.1 pg (ref 26.6–33.0)
MCHC: 33.5 g/dL (ref 31.5–35.7)
MCV: 96 fL (ref 79–97)
Platelets: 212 10*3/uL (ref 150–450)
RBC: 4.73 x10E6/uL (ref 4.14–5.80)
RDW: 12.4 % (ref 11.6–15.4)
WBC: 5.7 10*3/uL (ref 3.4–10.8)

## 2023-09-22 LAB — COMPREHENSIVE METABOLIC PANEL WITH GFR
ALT: 23 IU/L (ref 0–44)
AST: 25 IU/L (ref 0–40)
Albumin: 4.7 g/dL (ref 3.9–4.9)
Alkaline Phosphatase: 63 IU/L (ref 44–121)
BUN/Creatinine Ratio: 10 (ref 10–24)
BUN: 14 mg/dL (ref 8–27)
Bilirubin Total: 0.6 mg/dL (ref 0.0–1.2)
CO2: 20 mmol/L (ref 20–29)
Calcium: 9.4 mg/dL (ref 8.6–10.2)
Chloride: 102 mmol/L (ref 96–106)
Creatinine, Ser: 1.45 mg/dL — ABNORMAL HIGH (ref 0.76–1.27)
Globulin, Total: 2.6 g/dL (ref 1.5–4.5)
Glucose: 105 mg/dL — ABNORMAL HIGH (ref 70–99)
Potassium: 5.1 mmol/L (ref 3.5–5.2)
Sodium: 139 mmol/L (ref 134–144)
Total Protein: 7.3 g/dL (ref 6.0–8.5)
eGFR: 53 mL/min/{1.73_m2} — ABNORMAL LOW (ref >59)

## 2023-09-22 LAB — LIPID PANEL
Chol/HDL Ratio: 2.9 ratio (ref 0.0–5.0)
Cholesterol, Total: 116 mg/dL (ref 100–199)
HDL: 40 mg/dL (ref >39)
LDL Chol Calc (NIH): 53 mg/dL (ref 0–99)
Triglycerides: 128 mg/dL (ref 0–149)
VLDL Cholesterol Cal: 23 mg/dL (ref 5–40)

## 2023-09-22 LAB — MAGNESIUM: Magnesium: 2.3 mg/dL (ref 1.6–2.3)

## 2023-10-14 LAB — BASIC METABOLIC PANEL WITH GFR
BUN/Creatinine Ratio: 11 (ref 10–24)
BUN: 17 mg/dL (ref 8–27)
CO2: 22 mmol/L (ref 20–29)
Calcium: 9.7 mg/dL (ref 8.6–10.2)
Chloride: 101 mmol/L (ref 96–106)
Creatinine, Ser: 1.5 mg/dL — ABNORMAL HIGH (ref 0.76–1.27)
Glucose: 102 mg/dL — ABNORMAL HIGH (ref 70–99)
Potassium: 4.1 mmol/L (ref 3.5–5.2)
Sodium: 139 mmol/L (ref 134–144)
eGFR: 51 mL/min/{1.73_m2} — ABNORMAL LOW (ref >59)

## 2023-10-25 ENCOUNTER — Other Ambulatory Visit: Payer: Self-pay

## 2023-10-25 DIAGNOSIS — J439 Emphysema, unspecified: Secondary | ICD-10-CM

## 2023-10-25 MED ORDER — UMECLIDINIUM-VILANTEROL 62.5-25 MCG/ACT IN AEPB: 1.0000 | INHALATION_SPRAY | Freq: Every day | RESPIRATORY_TRACT | 11 refills | Status: AC

## 2023-10-25 NOTE — Progress Notes (Signed)
 Received a fax from [facility] asking for a refill on Anoro.  I have sent in the refill.  Nothing further needed.

## 2023-11-27 ENCOUNTER — Other Ambulatory Visit: Payer: Self-pay | Admitting: Cardiology

## 2023-12-13 ENCOUNTER — Other Ambulatory Visit: Payer: Self-pay | Admitting: Student in an Organized Health Care Education/Training Program

## 2023-12-13 DIAGNOSIS — J439 Emphysema, unspecified: Secondary | ICD-10-CM

## 2023-12-21 ENCOUNTER — Other Ambulatory Visit: Payer: Self-pay | Admitting: Acute Care

## 2023-12-21 ENCOUNTER — Other Ambulatory Visit: Payer: Self-pay | Admitting: Cardiovascular Disease

## 2023-12-21 DIAGNOSIS — Z122 Encounter for screening for malignant neoplasm of respiratory organs: Secondary | ICD-10-CM

## 2023-12-21 DIAGNOSIS — Z87891 Personal history of nicotine dependence: Secondary | ICD-10-CM

## 2023-12-23 ENCOUNTER — Other Ambulatory Visit: Payer: Self-pay | Admitting: Cardiovascular Disease

## 2023-12-26 ENCOUNTER — Ambulatory Visit: Payer: Self-pay | Admitting: Family Medicine

## 2023-12-26 ENCOUNTER — Ambulatory Visit: Payer: Self-pay | Admitting: Cardiovascular Disease

## 2023-12-26 NOTE — Telephone Encounter (Signed)
 Noted. Nothing further needed.

## 2023-12-26 NOTE — Telephone Encounter (Signed)
 FYI Only or Action Required?: Action required by provider: update on patient condition.  Patient is followed in Pulmonology for pulmonary emphysema, last seen on 02/11/2023 by Darryl Hose, MD.  Called Nurse Triage reporting Shortness of Breath.  Symptoms began several months ago.  Interventions attempted: Rescue inhaler.  Symptoms are: gradually worsening.  Triage Disposition: See HCP Within 4 Hours (Or PCP Triage)  Patient/caregiver understands and will follow disposition?: No, wishes to speak with PCP               Copied from CRM #[id number]. Topic: Clinical - Red Word Triage >> Dec 26, 2023  8:10 AM Darryl Gilbert wrote: Darryl Gilbert that prompted transfer to Nurse Triage: patient experiencing SOB for last couple of months and worsening with cough Reason for Disposition  [1] MILD difficulty breathing (e.g., minimal/no SOB at rest, SOB with walking, pulse < 100) AND [2] NEW-onset or WORSE than normal  Answer Assessment - Initial Assessment Questions This RN scheduled pt for first available appt with pulmonologist, Darryl Gilbert, on 10/2. This RN recommends pt is seen sooner based off symptoms but pt only wants to see his pulmonologist. This RN added pt to cancellation list for Gilbert sooner appt. This RN educated pt on new-worsening symptoms and when to call back/seek emergent care. Pt verbalized understanding and agrees to plan.    RESPIRATORY STATUS: Describe your breathing? (e.g., wheezing, shortness of breath, unable to speak, severe coughing)      SOB, constant coughing (most of the time Gilbert dry cough); Gilbert lot of phlegm that he is having difficulty getting rid of  ONSET: When did this breathing problem begin?      2 months, getting worse, albuterol  not helping  PATTERN Does the difficult breathing come and go, or has it been constant since it started?      Comes and goes; at times will be fine for hours  SEVERITY: How bad is your breathing? (e.g., mild, moderate, severe)       Moderate when it happens  RECURRENT SYMPTOM: Have you had difficulty breathing before? If Yes, ask: When was the last time? and What happened that time?      Yes SOB since heart attack getting progressively worse  OTHER SYMPTOMS: Do you have any other symptoms? (e.g., chest pain, dizziness)     Dizziness x1-2 Gilbert day; denies chest pain; Gilbert couple of weeks ago pt states he was picking stuff up and started not feeling well; pt states his HR was 147-153 and stayed like that for an hour; pt states his HR is supposed to be about 53; O2 sat at that time was 93%- This RN recommends pt makes cardiologist aware of this episode  O2 SATURATION MONITOR:  Do you use an oxygen saturation monitor (pulse oximeter) at home? If Yes, ask: What is your reading (oxygen level) today? What is your usual oxygen saturation reading? (e.g., 95%)       Around 95%  Protocols used: Breathing Difficulty-Gilbert-AH

## 2023-12-28 ENCOUNTER — Ambulatory Visit (INDEPENDENT_AMBULATORY_CARE_PROVIDER_SITE_OTHER): Admitting: Student in an Organized Health Care Education/Training Program

## 2023-12-28 ENCOUNTER — Encounter: Payer: Self-pay | Admitting: Student in an Organized Health Care Education/Training Program

## 2023-12-28 VITALS — BP 100/60 | HR 54 | Temp 98.1°F | Ht 72.0 in | Wt 207.6 lb

## 2023-12-28 DIAGNOSIS — J439 Emphysema, unspecified: Secondary | ICD-10-CM

## 2023-12-28 DIAGNOSIS — R0602 Shortness of breath: Secondary | ICD-10-CM

## 2023-12-28 MED ORDER — AMOXICILLIN-POT CLAVULANATE 875-125 MG PO TABS: 1.0000 | ORAL_TABLET | Freq: Two times a day (BID) | ORAL | 0 refills | Status: AC

## 2023-12-28 NOTE — Progress Notes (Unsigned)
 Synopsis: Referred in *** by Darryl Leita POUR, MD  Assessment & Plan:   #Pulmonary emphysema #COPD GOLD 2B  He is here for COPD follow up. PFT's from 2024 showed obstruction and COPD with FEV1/FVC of 0.69 and FEV1 of 2.71L, 73% predicted, z-score of -2.01. Decline in DLCO is consistent with the emphysema noted on his screening lung CT.   He is currently maintained on LAMA/LABA with Anoro Ellipta  and PRN albuterol . He does report an increase in cough that I will treat with a course of antibiotics for a presumed mild exacerbation. His lung exam today is clear and I will hold off on any steroids. Peak Eosinophil count was 500 in the past. I will hold off on initiation of ICS for now and continue to observe symptoms. Should he continue to have cough and increase dyspnea despite pulmonary rehab, will consider ICS initiation. I will also consider initiation of ensifentrine  at follow up. We did discuss pulmonary rehab, and he is unable to present in person given his work schedule. Offered him tele-pulm rehab and he agreed to try it.   - amoxicillin -clavulanate (AUGMENTIN ) 875-125 MG tablet; Take 1 tablet by mouth 2 (two) times daily for 7 days.  Dispense: 14 tablet; Refill: 0 - AMB referral to pulmonary rehabilitation - Continue Anoro Ellipta  - Continue with LDCT for lung cancer screening.   Return in about 6 months (around 06/26/2024).  I spent *** minutes caring for this patient today, including {EM billing:[id number]}  Darryl November, MD [facility] 12/28/2023 10:11 AM    End of visit medications:  Meds ordered this encounter  Medications   amoxicillin -clavulanate (AUGMENTIN ) 875-125 MG tablet    Sig: Take 1 tablet by mouth 2 (two) times daily for 7 days.    Dispense:  14 tablet    Refill:  0     Current Outpatient Medications:    albuterol  (VENTOLIN  HFA) 108 (90 Base) MCG/ACT inhaler, INHALE TWO PUFFS BY MOUTH EVERY 6 HOURS AS NEEDED FOR WHEEZING OR FOR SHORTNESS OF  BREATH, Disp: 8.5 each, Rfl: 6   amoxicillin -clavulanate (AUGMENTIN ) 875-125 MG tablet, Take 1 tablet by mouth 2 (two) times daily for 7 days., Disp: 14 tablet, Rfl: 0   aspirin  81 MG chewable tablet, Chew 1 tablet (81 mg total) by mouth daily. (Patient taking differently: Chew 81 mg by mouth every morning.), Disp: 30 tablet, Rfl: 0   atorvastatin  (LIPITOR) 80 MG tablet, TAKE ONE TABLET BY MOUTH ONE TIME DAILY, Disp: 90 tablet, Rfl: 3   buPROPion  (WELLBUTRIN  XL) 300 MG 24 hr tablet, Take 300 mg by mouth every morning., Disp: , Rfl:    clopidogrel  (PLAVIX ) 75 MG tablet, TAKE ONE TABLET BY MOUTH ONE TIME DAILY, Disp: 90 tablet, Rfl: 2   escitalopram (LEXAPRO) 5 MG tablet, Take 5 mg by mouth daily., Disp: , Rfl:    esomeprazole  (NEXIUM ) 20 MG capsule, Take 1 capsule (20 mg total) by mouth daily at 12 noon. (Patient taking differently: Take 20 mg by mouth every morning.), Disp: 30 capsule, Rfl: 6   FARXIGA  10 MG TABS tablet, Take 10 mg by mouth daily., Disp: , Rfl:    furosemide  (LASIX ) 20 MG tablet, TAKE ONE TABLET BY MOUTH ONE TIME DAILY, Disp: 90 tablet, Rfl: 3   HYDROcodone -acetaminophen  (NORCO) 10-325 MG tablet, Take 1 tablet by mouth in the morning, at noon, in the evening, and at bedtime., Disp: , Rfl:    metoprolol  succinate (TOPROL -XL) 25 MG 24 hr tablet, TAKE ONE TABLET  BY MOUTH AT BEDTIME, Disp: 90 tablet, Rfl: 3   nitroGLYCERIN  (NITROSTAT ) 0.4 MG SL tablet, Place 1 tablet (0.4 mg total) under the tongue every 5 (five) minutes as needed for chest pain., Disp: 25 tablet, Rfl: 3   sacubitril -valsartan  (ENTRESTO ) 24-26 MG, Take 1 tablet by mouth 2 (two) times daily., Disp: 180 tablet, Rfl: 3   spironolactone  (ALDACTONE ) 25 MG tablet, TAKE ONE-HALF TABLET BY MOUTH ONE TIME DAILY, Disp: 45 tablet, Rfl: 3   tamsulosin (FLOMAX) 0.4 MG CAPS capsule, Take 0.4 mg by mouth daily after supper., Disp: , Rfl:    umeclidinium-vilanterol (ANORO ELLIPTA ) 62.5-25 MCG/ACT AEPB, Inhale 1 puff into the lungs  daily., Disp: 30 each, Rfl: 11   Subjective:   PATIENT ID: Darryl Gilbert GENDER: male DOB: Jan 05, 1956, MRN: [id number]  Chief Complaint  Patient presents with   Shortness of Breath    Acute SOB/DOE, emphysema  Coughing a lot more lately, SOB/DOE and increased phlegm in his throat that he can't seem to get up, has been taking his nebulizer and inhalers but neither have been very helpful. Occasional wheezing.     HPI  Darryl Gilbert is a 68 year old male with history of emphysema and COPD presenting for follow up.  Symptoms started around the time of his heart attack. He was previously in his usual state of health with no dyspnea and no other respiratory symptoms. Following his heart attack, he's been short of breath at rest and with exertion. He does not feel that the dyspnea is worse with exertion.   Darryl Gilbert suffered an inferior STEMI 12/2020 with thrombotic occlusion of his proximal left circumflex. He was also found to have a 70% stenotic RCA. LVEDP during the procedure was measured at 22 mmHg. A drug eluting stent was deployed to the left circumflex and he's been on DAPT since. Most recent TTE 04/2022 showed an EF of 35-40% with global hypokinesis and severe hypokinesis of the inferior wall and grade II diastolic dysfunction. RV function was normal. Patient's Prasugrel  was switched to Clopidogrel  in February of 2024. He is now maintained on bisoprolol , spironolactone , and sacubitril -valsartan  ***. He had atrial fibrillation in the setting of his MI, but this has not recurred and he is not maintained on anti-coagulation. *** update ***  Return Visit 02/11/2023:   He has felt well since his last visit. No new symptoms reported. No increase in shortness of breath or cough. Remains short of breath with exertion. He is compliant with his inhalers. Denies wheeze, chest pain, or chest tightness. He was recently admitted for cholecystectomy, which was uncomplicated. He's also enrolled in lung cancer  screening.   Return Visit 12/28/2023:  Returns for follow up with some increase in shortness of breath over the past 6 months. He's coughing more and finds himself to be more short of breath than usual. He does feel shortness of breath with minimal exertion. He has not had any wheeze or chest tightness. He continues to be compliant with his inhalers (Anoro). He does have to use his albuterol  3 to 4 times a day to help with symptoms.   Patient reports a long standing history of smoking, quit in 12/2020. He smoked between 1.5 and 2 packs a day for around 50 years. He has previously worked in [employer], as a Teaching laboratory technician, Occupational hygienist, and currently works a Office manager. He has some exposure to asbestos while working with car breaks in the past.  Ancillary information including prior medications, full medical/surgical/family/social histories,  and PFTs (when available) are listed below and have been reviewed.    ROS   Objective:   Vitals:   12/28/23 0953  BP: 100/60  Pulse: (!) 54  Temp: 98.1 F (36.7 C)  SpO2: 96%  Weight: 207 lb 9.6 oz (94.2 kg)  Height: 6' (1.829 m)   96% on RA BMI Readings from Last 3 Encounters:  12/28/23 28.16 kg/m  09/21/23 28.79 kg/m  07/29/23 28.87 kg/m   Wt Readings from Last 3 Encounters:  12/28/23 207 lb 9.6 oz (94.2 kg)  09/21/23 206 lb 6.4 oz (93.6 kg)  07/29/23 207 lb (93.9 kg)    Physical Exam    Ancillary Information    Past Medical History:  Diagnosis Date   Abdominal aortic aneurysm (AAA) (HCC)    Acute cholecystitis    Acute ST elevation myocardial infarction (STEMI) of inferior wall (HCC) 12/20/2020   a.) LHC/PCI 12/20/2020: EF 35-45%. LVEDP = 22. 70% o-pRCA, 100% thrombotic occlusion p-mLCx with suspected vasospasm (3.0 x 22 mm Onyx Frontier DES), 60% OM1. Case complicated by development of A.fib --> amiodarone  gtt started --> developed prolonged QTc and polymorphic VT requiring multiple defibfrillations --> amiodarone   discontinued.   Aortic atherosclerosis (HCC)    Asymptomatic bradycardia    Back pain    BPH (benign prostatic hyperplasia)    CKD (chronic kidney disease), stage III (HCC)    Colon polyp    COPD (chronic obstructive pulmonary disease) (HCC)    Coronary artery disease 12/20/2020   a.) inf STEMI 12/20/2020 --> LHC/PCI: 70% o-pRCA, 100% thrombotic occlusion p-mLCx with suspected vasospasm (3.0 x 22 mm Onyx Frontier DES), 60% OM1; b.) LHC 02/23/2021: 50% OM1, 80% dLAD, 50% LPAV, 40% o-pRCA - med mgmt   Diverticular disease    Edentulous    GERD (gastroesophageal reflux disease)    Hepatic cyst    HFrEF (heart failure with reduced ejection fraction) (HCC)    a.) TTE 12/21/2020: EF 50%, mild LVH, mod inferolat HK, G2DD; b.) TTE 12/22/2020: EF 45-50%, inferolat HK, mild LVH; c.) TTE 06/25/2021: EF 45%, mild LCH, G2DD; d.) TTE 04/27/2022: EF 35-40%, glob HK (sev inf HK), mild LVE, G2DD, mild-mod MR   History of 2019 novel coronavirus disease (COVID-19) 04/2022   a.) rapid home Ag testing (+)   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 01/08/2021   a.) occurred in setting of inferior STEMI (during LHC) --> developed A.fib --> amiodarone  gtt started --> developed prolonged QTc and polymorphic VT requiring multiple defibrillations --> amiodarone  discontinued   Hyperlipidemia LDL goal <70    Hypertension    Incarcerated umbilical hernia    Infrarenal abdominal aortic aneurysm (AAA) without rupture (HCC) 12/02/2022   a.) CT A/P 12/02/2022: 4.1 cm   Ischemic cardiomyopathy 12/20/2020   a.) LHC 12/20/2020: EF 35-45%; b.) TTE 12/21/2020: EF 50%, c.) TTE 12/22/2020: EF 45-50%; d.) LHC 02/23/2021: EF 35-45%; e.) TTE 06/25/2021: EF 45%; f.) TTE 04/27/2022: EF 35-40%; g.) MV 05/06/2022: EF 48%   Long term current use of aspirin     Long term current use of clopidogrel     NSVT (nonsustained ventricular tachycardia) (HCC) 05/08/2021   a.) Zio 04/28/2021: 5 runs with the fastest  lasting 12 beats (max rate 190); b.) live tele study 06/23/2021: 5 runs with longested lasting 10 beasts (max rate 103); c.) Zio 08/31/2022: 50 runs with longest lasting 13.8 seconds (max rate 159); d.) Zio 11/04/2022: 1 run lasting 4 beats   PAF (paroxysmal atrial fibrillation) (HCC)  a.) CHA2DS2-VASc = 4 (age, HFrEF, HTN, previous MI) as of 01/28/2023; b.) cardiac rate/rhythm maintained on oral metoprolol  succinate; no chronically anticoagulation (is on DAPT)   Palpitations (PAC/PVCs)    a.) live tele study 06/23/2021: freq PACs (11.6% burden) and occassional PVCs (2.9% burden); b.) Zio 08/31/2022: freq PACs (17.3% burden) and occassional PVCs (2.4% burden); c.) Zio 11/04/2022: improved PAC burden down to 8.4% with rare PVCs   PSVT (paroxysmal supraventricular tachycardia) (HCC) 04/28/2021   a.) Zio 04/28/2021: 119 runs with fastest lasting 7 beats (max rate 200) and longest lasting 13.7 seconds (max rate 122); b.) live tele study 06/23/2021: 109 runs with longest lasting 29.4 seconds (max rate 100); c.) Zio 08/31/2022: 196 runs with longest lasting 22.1 seconds (max rate 166); d.) Zio 30 runs with longest lasting 26.5 seconds   Smoking      History reviewed. No pertinent family history.   Past Surgical History:  Procedure Laterality Date   COLONOSCOPY WITH PROPOFOL  N/A 05/07/2022   Procedure: COLONOSCOPY WITH PROPOFOL ;  Surgeon: Therisa Bi, MD;  Location: [facility] ENDOSCOPY;  Service: Gastroenterology;  Laterality: N/A;   CORONARY/GRAFT ACUTE MI REVASCULARIZATION N/A 12/20/2020   Procedure: Coronary/Graft Acute MI Revascularization;  Surgeon: Darron Deatrice LABOR, MD;  Location: [facility] INVASIVE CV LAB;  Service: Cardiovascular;  Laterality: N/A;   FRACTURE SURGERY     Left leg fracture, rod placement   INGUINAL HERNIA REPAIR Bilateral    LEFT HEART CATH AND CORONARY ANGIOGRAPHY N/A 12/20/2020   Procedure: LEFT HEART CATH AND CORONARY ANGIOGRAPHY;  Surgeon: Darron Deatrice LABOR, MD;  Location: [facility]  INVASIVE CV LAB;  Service: Cardiovascular;  Laterality: N/A;   LEFT HEART CATH AND CORONARY ANGIOGRAPHY N/A 02/23/2021   Procedure: LEFT HEART CATH AND CORONARY ANGIOGRAPHY;  Surgeon: Darron Deatrice LABOR, MD;  Location: [facility] INVASIVE CV LAB;  Service: Cardiovascular;  Laterality: N/A;   UMBILICAL HERNIA REPAIR N/A 02/01/2023   Procedure: HERNIA REPAIR UMBILICAL ADULT, open;  Surgeon: Desiderio Schanz, MD;  Location: [facility] ORS;  Service: General;  Laterality: N/A;    Social History   Socioeconomic History   Marital status: Divorced    Spouse name: Not on file   Number of children: Not on file   Years of education: Not on file   Highest education level: Not on file  Occupational History   Not on file  Tobacco Use   Smoking status: Former    Current packs/day: 0.00    Average packs/day: 1.5 packs/day for 45.0 years (67.5 ttl pk-yrs)    Types: Cigarettes    Start date: 12/21/1975    Quit date: 12/20/2020    Years since quitting: 3.0    Passive exposure: Past   Smokeless tobacco: Never  Vaping Use   Vaping status: Never Used  Substance and Sexual Activity   Alcohol use: Not Currently   Drug use: Never   Sexual activity: Not on file  Other Topics Concern   Not on file  Social History Narrative   Not on file   Social Drivers of Health   Financial Resource Strain: Not on file  Food Insecurity: No Food Insecurity (12/03/2022)   Hunger Vital Sign    Worried About Running Out of Food in the Last Year: Never true    Ran Out of Food in the Last Year: Never true  Transportation Needs: No Transportation Needs (12/03/2022)   PRAPARE - Administrator, Civil Service (Medical): No    Lack of Transportation (Non-Medical): No  Physical Activity: Not  on file  Stress: Not on file  Social Connections: Not on file  Intimate Partner Violence: Not At Risk (12/03/2022)   Humiliation, Afraid, Rape, and Kick questionnaire    Fear of Current or Ex-Partner: No    Emotionally Abused: No     Physically Abused: No    Sexually Abused: No     Allergies  Allergen Reactions   Amiodarone  Other (See Comments)    QT prolongation in the context of inferior STEMI and IV amiodarone  use. Avoid all QT prolonging medications.      CBC    Component Value Date/Time   WBC 5.7 09/21/2023 0931   WBC 6.5 12/07/2022 0618   RBC 4.73 09/21/2023 0931   RBC 4.85 12/07/2022 0618   HGB 15.2 09/21/2023 0931   HCT 45.4 09/21/2023 0931   PLT 212 09/21/2023 0931   MCV 96 09/21/2023 0931   MCH 32.1 09/21/2023 0931   MCH 31.1 12/07/2022 0618   MCHC 33.5 09/21/2023 0931   MCHC 34.5 12/07/2022 0618   RDW 12.4 09/21/2023 0931   LYMPHSABS 2.1 06/01/2021 0705   LYMPHSABS 2.1 03/30/2021 0849   MONOABS 0.3 06/01/2021 0705   EOSABS 0.4 06/01/2021 0705   EOSABS 0.5 (H) 03/30/2021 0849   BASOSABS 0.1 06/01/2021 0705   BASOSABS 0.1 03/30/2021 0849    Pulmonary Functions Testing Results:    Latest Ref Rng & Units 10/12/2022    8:36 AM  PFT Results  FVC-Pre L 3.87   FVC-Predicted Pre % 78   FVC-Post L 3.96   FVC-Predicted Post % 80   Pre FEV1/FVC % % 69   Post FEV1/FCV % % 68   FEV1-Pre L 2.68   FEV1-Predicted Pre % 72   FEV1-Post L 2.71   DLCO uncorrected ml/min/mmHg 16.02   DLCO UNC% % 56   DLVA Predicted % 57   TLC L 7.79   TLC % Predicted % 105   RV % Predicted % 150     Outpatient Medications Prior to Visit  Medication Sig Dispense Refill   albuterol  (VENTOLIN  HFA) 108 (90 Base) MCG/ACT inhaler INHALE TWO PUFFS BY MOUTH EVERY 6 HOURS AS NEEDED FOR WHEEZING OR FOR SHORTNESS OF BREATH 8.5 each 6   aspirin  81 MG chewable tablet Chew 1 tablet (81 mg total) by mouth daily. (Patient taking differently: Chew 81 mg by mouth every morning.) 30 tablet 0   atorvastatin  (LIPITOR) 80 MG tablet TAKE ONE TABLET BY MOUTH ONE TIME DAILY 90 tablet 3   buPROPion  (WELLBUTRIN  XL) 300 MG 24 hr tablet Take 300 mg by mouth every morning.     clopidogrel  (PLAVIX ) 75 MG tablet TAKE ONE TABLET BY MOUTH ONE  TIME DAILY 90 tablet 2   escitalopram (LEXAPRO) 5 MG tablet Take 5 mg by mouth daily.     esomeprazole  (NEXIUM ) 20 MG capsule Take 1 capsule (20 mg total) by mouth daily at 12 noon. (Patient taking differently: Take 20 mg by mouth every morning.) 30 capsule 6   FARXIGA  10 MG TABS tablet Take 10 mg by mouth daily.     furosemide  (LASIX ) 20 MG tablet TAKE ONE TABLET BY MOUTH ONE TIME DAILY 90 tablet 3   HYDROcodone -acetaminophen  (NORCO) 10-325 MG tablet Take 1 tablet by mouth in the morning, at noon, in the evening, and at bedtime.     metoprolol  succinate (TOPROL -XL) 25 MG 24 hr tablet TAKE ONE TABLET BY MOUTH AT BEDTIME 90 tablet 3   nitroGLYCERIN  (NITROSTAT ) 0.4 MG SL tablet Place 1  tablet (0.4 mg total) under the tongue every 5 (five) minutes as needed for chest pain. 25 tablet 3   sacubitril -valsartan  (ENTRESTO ) 24-26 MG Take 1 tablet by mouth 2 (two) times daily. 180 tablet 3   spironolactone  (ALDACTONE ) 25 MG tablet TAKE ONE-HALF TABLET BY MOUTH ONE TIME DAILY 45 tablet 3   tamsulosin (FLOMAX) 0.4 MG CAPS capsule Take 0.4 mg by mouth daily after supper.     umeclidinium-vilanterol (ANORO ELLIPTA ) 62.5-25 MCG/ACT AEPB Inhale 1 puff into the lungs daily. 30 each 11   No facility-administered medications prior to visit.

## 2023-12-30 ENCOUNTER — Ambulatory Visit
Admission: RE | Admit: 2023-12-30 | Discharge: 2023-12-30 | Disposition: A | Discharge status: Home / Self Care | Source: Ambulatory Visit | Attending: Family Medicine | Admitting: Family Medicine

## 2023-12-30 DIAGNOSIS — Z87891 Personal history of nicotine dependence: Secondary | ICD-10-CM | POA: Diagnosis present

## 2023-12-30 DIAGNOSIS — Z122 Encounter for screening for malignant neoplasm of respiratory organs: Secondary | ICD-10-CM | POA: Diagnosis present

## 2024-01-02 ENCOUNTER — Telehealth: Payer: Self-pay | Admitting: Student in an Organized Health Care Education/Training Program

## 2024-01-02 DIAGNOSIS — J439 Emphysema, unspecified: Secondary | ICD-10-CM

## 2024-01-02 DIAGNOSIS — J449 Chronic obstructive pulmonary disease, unspecified: Secondary | ICD-10-CM

## 2024-01-02 NOTE — Telephone Encounter (Signed)
 Dr. Isadora we have received a message from [facility]. They need the Dx code of J44.9 insteadof  J43.9 for insurance coverage

## 2024-01-02 NOTE — Telephone Encounter (Signed)
 Per Dr. Isadora- COPD level 2.  I have redone the order.

## 2024-01-02 NOTE — Telephone Encounter (Signed)
 I can redo the order. What stage COPD do you want me to put?

## 2024-01-03 NOTE — Telephone Encounter (Signed)
 The new referral order has been faxed again to [facility]

## 2024-01-11 ENCOUNTER — Other Ambulatory Visit: Payer: Self-pay | Admitting: Acute Care

## 2024-01-11 DIAGNOSIS — Z87891 Personal history of nicotine dependence: Secondary | ICD-10-CM

## 2024-01-11 DIAGNOSIS — Z122 Encounter for screening for malignant neoplasm of respiratory organs: Secondary | ICD-10-CM

## 2024-01-19 ENCOUNTER — Ambulatory Visit: Admitting: Student in an Organized Health Care Education/Training Program

## 2024-02-10 ENCOUNTER — Other Ambulatory Visit: Payer: Self-pay | Admitting: Medical Genetics

## 2024-03-13 NOTE — Progress Notes (Unsigned)
  Electrophysiology Office Follow up Visit Note:    Date:  03/14/2024   ID:  Darryl Gilbert, DOB 1955-12-16, MRN [id number]  PCP:  Derick Leita POUR, MD  [facility] Cardiologist:  Deatrice Cage, MD  [facility] Electrophysiologist:  OLE ONEIDA HOLTS, MD    Interval History:     Darryl Gilbert is a 68 y.o. male who presents for a follow up visit.   The patient last saw Elvie July 29, 2023.  The patient has a history of coronary artery disease, SVT, chronic systolic heart failure, atrial fibrillation, COPD, AAA.  At the last appointment he was doing okay.  He had previously experienced drug-induced polymorphic VT on QT prolonging medication.  He is doing well today.  No recurrences of sustained arrhythmias.      Past medical, surgical, social and family history were reviewed.  ROS:   Please see the history of present illness.    All other systems reviewed and are negative.  EKGs/Labs/Other Studies Reviewed:    The following studies were reviewed today:          Physical Exam:    VS:  BP 100/60 (BP Location: Left Arm, Patient Position: Sitting, Cuff Size: Normal)   Pulse (!) 58   Ht 5' 11 (1.803 m)   Wt 208 lb 3.2 oz (94.4 kg)   SpO2 98%   BMI 29.04 kg/m     Wt Readings from Last 3 Encounters:  03/14/24 208 lb 3.2 oz (94.4 kg)  12/28/23 207 lb 9.6 oz (94.2 kg)  09/21/23 206 lb 6.4 oz (93.6 kg)     GEN: no distress CARD: RRR, No MRG RESP: No IWOB. CTAB.      ASSESSMENT:    1. Chronic systolic heart failure (HCC)   2. Coronary artery disease involving native coronary artery of native heart with angina pectoris   3. SVT (supraventricular tachycardia)    PLAN:    In order of problems listed above:  #Palpitations #SVT Well-controlled.  Continue metoprolol  at the current dose.  #Chronic systolic heart failure NYHA class II.  Warm and dry on exam.  Continue spironolactone , Entresto , metoprolol , Lasix , Farxiga   #Coronary artery disease No  ischemic symptoms today Continue aspirin  and Plavix  and statin  I discussed my upcoming departure from [facility] during today's clinic appointment.  The patient will continue to follow-up with one of my EP partners moving forward.   Follow-up with EP APP in 1 year     Signed, Ole Holts, MD, [facility], [facility] 03/14/2024 8:41 AM    Electrophysiology [facility]

## 2024-03-14 ENCOUNTER — Encounter: Payer: Self-pay | Admitting: Cardiology

## 2024-03-14 ENCOUNTER — Ambulatory Visit: Attending: Cardiology | Admitting: Cardiology

## 2024-03-14 VITALS — BP 100/60 | HR 58 | Ht 71.0 in | Wt 208.2 lb

## 2024-03-14 DIAGNOSIS — I471 Supraventricular tachycardia, unspecified: Secondary | ICD-10-CM | POA: Diagnosis not present

## 2024-03-14 DIAGNOSIS — I5022 Chronic systolic (congestive) heart failure: Secondary | ICD-10-CM

## 2024-03-14 DIAGNOSIS — I25119 Atherosclerotic heart disease of native coronary artery with unspecified angina pectoris: Secondary | ICD-10-CM | POA: Diagnosis not present

## 2024-03-14 NOTE — Patient Instructions (Signed)

## 2024-03-22 ENCOUNTER — Ambulatory Visit: Attending: Medical | Admitting: Medical

## 2024-03-22 ENCOUNTER — Encounter: Payer: Self-pay | Admitting: Medical

## 2024-03-22 VITALS — BP 100/58 | HR 60 | Ht 71.0 in | Wt 209.2 lb

## 2024-03-22 DIAGNOSIS — E782 Mixed hyperlipidemia: Secondary | ICD-10-CM | POA: Diagnosis not present

## 2024-03-22 DIAGNOSIS — I2510 Atherosclerotic heart disease of native coronary artery without angina pectoris: Secondary | ICD-10-CM

## 2024-03-22 DIAGNOSIS — I714 Abdominal aortic aneurysm, without rupture, unspecified: Secondary | ICD-10-CM | POA: Diagnosis not present

## 2024-03-22 DIAGNOSIS — I5020 Unspecified systolic (congestive) heart failure: Secondary | ICD-10-CM | POA: Diagnosis not present

## 2024-03-22 DIAGNOSIS — I471 Supraventricular tachycardia, unspecified: Secondary | ICD-10-CM

## 2024-03-22 NOTE — Patient Instructions (Signed)
 Medication Instructions:  Your physician recommends that you continue on your current medications as directed. Please refer to the Current Medication list given to you today.   *If you need a refill on your cardiac medications before your next appointment, please call your pharmacy*  Lab Work: None ordered at this time   Testing/Procedures: Your physician has requested that you have an echocardiogram. Echocardiography is a painless test that uses sound waves to create images of your heart. It provides your doctor with information about the size and shape of your heart and how well your heart's chambers and valves are working.   You may receive an ultrasound enhancing agent through an IV if needed to better visualize your heart during the echo. This procedure takes approximately one hour.  There are no restrictions for this procedure.  This will take place at [address] [facility] [address], [place] [address]  Please note: We ask at that you not bring children with you during ultrasound (echo/ vascular) testing. Due to room size and safety concerns, children are not allowed in the ultrasound rooms during exams. Our front office staff cannot provide observation of children in our lobby area while testing is being conducted. An adult accompanying a patient to their appointment will only be allowed in the ultrasound room at the discretion of the ultrasound technician under special circumstances. We apologize for any inconvenience.  Follow-Up: At [facility], you and your health needs are our priority.  As part of our continuing mission to provide you with exceptional heart care, our providers are all part of one team.  This team includes your primary Cardiologist (physician) and Advanced Practice Providers or APPs (Physician Assistants and Nurse Practitioners) who all work together to provide you with the care you need, when you need it.  Your next appointment:   6  month(s)  Provider:   You may see Deatrice Cage, MD or Cadence Franchester, PA-C

## 2024-03-22 NOTE — Progress Notes (Signed)
 Cardiology Office Note   Date:  03/22/2024  ID:  Darryl Gilbert, Darryl Gilbert 04/15/1956, MRN [id number] PCP: Derick Leita POUR, MD  [facility] Providers Cardiologist:  Deatrice Cage, MD Electrophysiologist:  Fonda Kitty, MD   History of Present Illness Darryl Gilbert is a 68 y.o. 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    He presented in September 2022 with inferior STEMI. Emergent cardiac cath showed codominant coronary arteries with thrombotic occlusion of the proximal left circumflex.  The patient was treated with successful angioplasty and drug-eluting stent placement to the left circumflex.  He had atrial fibrillation that was treated with amiodarone  drip and then he converted to sinus rhythm.  Echo showed EF of 50%, while on amiodarone , he developed prolonged QT interval and frequent PVCs followed by polymorphic VT that required multiple shocks.  Amiodarone  was discontinued.  Repeat echocardiogram showed stable EF of 45 to 50%. He continued to have significant shortness of breath and this he underwent repeat cardiac catheterization in November 2022 which showed patent left  Cx stent with no significant restenosis with an EF of 35-40%. Metoprolol  was discontinued during that time due to bradycardia with HR in the 40s. Echo in March 2023 showed an EF 45%. He quit smoking after his MI.   He had a repeat echo in January 2024 that showed LVEF 35-40% with mild to mod MR. He underwent lexiscan  myoview  in January which showed evidence of prior infarction in the left Cx distribution without ischemia. EF was 48%  The patient was last seen by Dr. Cindie 03/14/2024 at which time patient was stable from a cardiac perspective.  Today, the patient is overall doing well. He denies chest pain. He has chronic and stable SOB.  No palpitations or fluttering. He has occasional lightheadedness and dizziness, likely from soft blood pressure. Patient is eating and drinking normally. He tries to stay active around the house. He is unable to do exercise because of his back.   Studies Reviewed EKG Interpretation Date/Time:  Thursday March 22 2024 08:53:34 EST Ventricular Rate:  60 PR Interval:  152 QRS Duration:  86 QT Interval:  428 QTC Calculation: 428 R Axis:   64  Text Interpretation: Sinus bradycardia with Premature atrial complexes When compared with ECG of 21-Sep-2023 08:52, Premature atrial complexes are now Present Confirmed by Franchester, Hashim Eichhorst ([id number]) on 03/22/2024 9:01:09 AM    Heart monitor 09/2022 HR 38 - 179 bpm, average 53 bpm. 1 NSVT lasting 4 beats. 30 nonsustained SVT, longest 26.5 seconds.  Frequent supraventricular ectopy, 8.4%. Rare ventricular ectopy. No atrial fibrillation. No sustained arrhythmias.   Improved burden of SVT and supraventricular ectopy compared to prior monitor.   Ole T. Cindie, MD, [facility], [facility] Cardiac Electrophysiology   MPI 04/2022 Narrative & Impression  Pharmacological myocardial perfusion imaging study with no significant  ischemia Moderate-sized fixed perfusion defect in the basal to mid lateral wall consistent with prior MI Hypokinesis of the basal to mid lateral wall, EF estimated at 48%, GI uptake artifact noted No EKG changes concerning for ischemia at peak stress or in recovery. CT attenuation correction images with coronary disease and stenting in the left circumflex, mild coronary calcification in the LAD, no significant aortic atherosclerosis Low risk scan       Echo 04/2022  1. Left ventricular  ejection fraction, by estimation, is 35 to 40%. The  left ventricle has moderately decreased function. The left ventricle  demonstrates global hypokinesis with severe hypokinesis of the inferior  wall. The left ventricular internal  cavity size was mildly  dilated. Left ventricular diastolic parameters are  consistent with Grade II diastolic dysfunction (pseudonormalization).   2. Right ventricular systolic function is normal. The right ventricular  size is normal.   3. The mitral valve is normal in structure. Mild to moderate mitral valve  regurgitation. No evidence of mitral stenosis.   4. The aortic valve has an indeterminant number of cusps. Aortic valve  regurgitation is not visualized. No aortic stenosis is present.   5. The inferior vena cava is normal in size with greater than 50%  respiratory variability, suggesting right atrial pressure of 3 mmHg.   Comparison(s): 06/25/21-EF 45%.   Cardiac Cath 02/2021     1st Mrg lesion is 50% stenosed.   Dist LAD lesion is 80% stenosed.   LPAV lesion is 50% stenosed.   Ost RCA to Prox RCA lesion is 40% stenosed.   Non-stenotic Prox Cx to Mid Cx lesion was previously treated.   There is moderate left ventricular systolic dysfunction.   LV end diastolic pressure is normal.   The left ventricular ejection fraction is 35-45% by visual estimate.   1.  Widely patent left circumflex stent with no significant restenosis.  The RCA is codominant with high anterior takeoff.  The 70% ostial stenosis that was noted on previous catheterization appears to be better and no more than 40%.  Suspect previous catheter induced spasm.  The proximal portion of the right coronary artery has an ectatic segment causing some sluggish flow in the vessel which was noted on previous angiography as well. 2.  Moderately reduced LV systolic function with an EF of 35 to 40%.  Normal left ventricular end-diastolic pressure at 7 mmHg.   Recommendations: Recommend continued medical therapy for coronary artery disease. The patient was noted to be bradycardic on presentation with heart rate in the low 40s.  Thus, I elected to discontinue metoprolol . The exact etiology for his continued shortness of breath is not entirely clear but  there could be a component of physical deconditioning.  In addition, he does have cardiomyopathy and we will have to treat him medically for heart failure.  His LVEDP was normal.       Physical Exam VS:  BP (!) 100/58 (BP Location: Left Arm, Patient Position: Sitting, Cuff Size: Normal)   Pulse 60   Ht 5' 11 (1.803 m)   Wt 209 lb 3.2 oz (94.9 kg)   SpO2 99%   BMI 29.18 kg/m        Wt Readings from Last 3 Encounters:  03/22/24 209 lb 3.2 oz (94.9 kg)  03/14/24 208 lb 3.2 oz (94.4 kg)  12/28/23 207 lb 9.6 oz (94.2 kg)    GEN: Well nourished, well developed in no acute distress NECK: No JVD; No carotid bruits CARDIAC: RRR, no murmurs, rubs, gallops RESPIRATORY:  Clear to auscultation without rales, wheezing or rhonchi  ABDOMEN: Soft, non-tender, non-distended EXTREMITIES:  No edema; No deformity   ASSESSMENT AND PLAN  CAD s/p PCI/DES x1 LCx The patient denies chest pain. He has chronic SOB that is unchanged. He is not very active at baseline due to back issues and general deconditioning. Due to ectasia in the RCA plan to continue long-term DAPT with ASA and Plavix . Continue Lipitor, Toprol , SL NTG.  Chronic systolic heart failure Echo 04/2022 showed LVEF 35-40%, global HK, G2DD, mild to mod MR. The patient reports he did not tolerate Farxiga . He is euvolemic on exam. Continue lasix  20mg  daily, Toprol  25mg  daily, Entresto  24-26mg BID, and spiro 12.5mg  daily. We will update an echo to assess pump function.   HLD LDL 53, TC 116, TG 128, HDL 40. Continue Lipitor 80mg  daily.   AAA US  06/2023 showed 3.7cm. repeat study for March 2026.   PAC/PVCs H/o SVT He denies palpitations. Continue low dose Toprol .     Dispo: follow-up in 6 month  Signed, Vermon Grays VEAR Fishman, PA-C

## 2024-04-26 ENCOUNTER — Ambulatory Visit: Attending: Medical

## 2024-04-26 DIAGNOSIS — I5020 Unspecified systolic (congestive) heart failure: Secondary | ICD-10-CM | POA: Diagnosis not present

## 2024-04-26 LAB — ECHOCARDIOGRAM COMPLETE
AR max vel: 2 cm2
AV Area VTI: 2.48 cm2
AV Area mean vel: 1.95 cm2
AV Mean grad: 2.8 mmHg
AV Peak grad: 5.3 mmHg
Ao pk vel: 1.15 m/s
Area-P 1/2: 4.03 cm2
S' Lateral: 3.51 cm

## 2024-04-30 ENCOUNTER — Ambulatory Visit: Payer: Self-pay | Admitting: Medical

## 2024-05-13 ENCOUNTER — Other Ambulatory Visit: Payer: Self-pay | Admitting: Cardiovascular Disease

## 2024-05-15 ENCOUNTER — Other Ambulatory Visit: Payer: Self-pay | Admitting: Cardiovascular Disease
